# Patient Record
Sex: Female | Born: 1980 | Race: White | Hispanic: No | Marital: Single
Health system: Midwestern US, Academic
[De-identification: ages and names within clinical notes are randomized; demographics above are authoritative.]

## PROBLEM LIST (undated history)

## (undated) DIAGNOSIS — Z3483 Encounter for supervision of other normal pregnancy, third trimester: ICD-10-CM

## (undated) DIAGNOSIS — G43009 Migraine without aura, not intractable, without status migrainosus: Secondary | ICD-10-CM

## (undated) DIAGNOSIS — I159 Secondary hypertension, unspecified: Secondary | ICD-10-CM

## (undated) DIAGNOSIS — R1013 Epigastric pain: Secondary | ICD-10-CM

---

## 2016-04-30 MED ORDER — LABETALOL 300 MG PO TAB
600 mg | ORAL_TABLET | Freq: Three times a day (TID) | ORAL | 2 refills | Status: CN
Start: 2016-04-30 — End: ?

## 2016-05-12 MED ORDER — INSULIN ASPART 100 UNIT/ML SC FLEXPEN
6 refills | 30.00000 days | Status: DC
Start: 2016-05-12 — End: 2016-10-02

## 2016-05-12 MED ORDER — INSULIN ASPART 100 UNIT/ML SC FLEXPEN
6 refills | 42.00000 days | Status: DC
Start: 2016-05-12 — End: 2016-05-12
  Filled 2016-06-26 (×2): qty 45, 31d supply, fill #3

## 2016-05-12 MED ORDER — INSULIN NPH ISOPH U-100 HUMAN 100 UNIT/ML SC SUSP
11 refills | Status: DC
Start: 2016-05-12 — End: 2016-10-02

## 2016-05-23 MED ORDER — CYCLOBENZAPRINE 10 MG PO TAB
10 mg | ORAL_TABLET | Freq: Three times a day (TID) | ORAL | 2 refills | 30.00000 days | Status: DC | PRN
Start: 2016-05-23 — End: 2017-02-23

## 2016-06-21 ENCOUNTER — Encounter: Admit: 2016-06-21 | Discharge: 2016-06-21 | Primary: Adult Health

## 2016-06-23 ENCOUNTER — Encounter: Admit: 2016-06-23 | Discharge: 2016-06-23 | Primary: Adult Health

## 2016-06-23 ENCOUNTER — Ambulatory Visit: Admit: 2016-06-23 | Discharge: 2016-06-24 | Payer: MEDICAID | Primary: Adult Health

## 2016-06-23 DIAGNOSIS — R69 Illness, unspecified: Principal | ICD-10-CM

## 2016-06-23 DIAGNOSIS — O0992 Supervision of high risk pregnancy, unspecified, second trimester: Principal | ICD-10-CM

## 2016-06-23 DIAGNOSIS — O24112 Pre-existing diabetes mellitus, type 2, in pregnancy, second trimester: ICD-10-CM

## 2016-06-26 ENCOUNTER — Encounter: Admit: 2016-06-26 | Discharge: 2016-06-26 | Primary: Adult Health

## 2016-06-26 ENCOUNTER — Ambulatory Visit: Admit: 2016-06-26 | Discharge: 2016-06-27 | Payer: MEDICAID | Primary: Adult Health

## 2016-06-26 DIAGNOSIS — O10919 Unspecified pre-existing hypertension complicating pregnancy, unspecified trimester: ICD-10-CM

## 2016-06-26 DIAGNOSIS — O24912 Unspecified diabetes mellitus in pregnancy, second trimester: Principal | ICD-10-CM

## 2016-06-26 MED FILL — INSULIN SYRINGE-NEEDLE U-100 0.5 ML 31 GAUGE X 5/16 MISC SYRG: 0.5 mL 31 gauge x 5/16" | 31 days supply | Qty: 100 | Fill #3 | Status: CP

## 2016-06-26 MED FILL — INSULIN NPH ISOPH U-100 HUMAN 100 UNIT/ML SC SUSP: 100 unit/mL | 24 days supply | Qty: 200 | Fill #3 | Status: CP

## 2016-06-27 NOTE — Progress Notes
Will discuss pt results and recommendations on 6/13 appt

## 2016-07-02 ENCOUNTER — Ambulatory Visit: Admit: 2016-07-02 | Discharge: 2016-07-02 | Payer: MEDICAID | Primary: Adult Health

## 2016-07-02 ENCOUNTER — Encounter: Admit: 2016-07-02 | Discharge: 2016-07-02 | Payer: MEDICAID | Primary: Adult Health

## 2016-07-02 ENCOUNTER — Encounter: Admit: 2016-07-02 | Discharge: 2016-07-02 | Primary: Adult Health

## 2016-07-02 DIAGNOSIS — O24912 Unspecified diabetes mellitus in pregnancy, second trimester: ICD-10-CM

## 2016-07-02 DIAGNOSIS — O10919 Unspecified pre-existing hypertension complicating pregnancy, unspecified trimester: Principal | ICD-10-CM

## 2016-07-02 DIAGNOSIS — O24913 Unspecified diabetes mellitus in pregnancy, third trimester: ICD-10-CM

## 2016-07-02 DIAGNOSIS — E119 Type 2 diabetes mellitus without complications: Principal | ICD-10-CM

## 2016-07-02 DIAGNOSIS — O0992 Supervision of high risk pregnancy, unspecified, second trimester: Principal | ICD-10-CM

## 2016-07-02 DIAGNOSIS — I10 Essential (primary) hypertension: ICD-10-CM

## 2016-07-02 DIAGNOSIS — E669 Obesity, unspecified: ICD-10-CM

## 2016-07-02 NOTE — Progress Notes
1. Have you or your sexual partner traveled away from [place] in the last 12 weeks? No

## 2016-07-03 ENCOUNTER — Encounter: Admit: 2016-07-03 | Discharge: 2016-07-03 | Primary: Adult Health

## 2016-07-03 LAB — HEMOGLOBIN A1C: Lab: 5.2 % (ref 4.0–6.0)

## 2016-07-03 MED ORDER — METFORMIN 1,000 MG PO TAB
1000 mg | ORAL_TABLET | Freq: Two times a day (BID) | ORAL | 1 refills | Status: SS
Start: 2016-07-03 — End: 2016-10-02

## 2016-07-03 MED FILL — METFORMIN 1,000 MG PO TAB: 1000 mg | ORAL | 90 days supply | Qty: 180 | Status: CN

## 2016-07-03 NOTE — Telephone Encounter
Patient called to determine if her colposcopy had been scheduled.  She was informed of the time and date.  Pt also requested a refill of Metformin 1000 mg BID.  This was called to her pharmacy.

## 2016-07-03 NOTE — Progress Notes
Nettie ElmSamantha Ann Swint presents for an ultrasound encounter. Past Medical, Surgical, Family & Social History; Medications & Allergies contained in the electronic record below were not reviewed today and may not be up-to-date. Please see A/S OBGYN report for all documentation related to this encounter.    07/03/2016  Butch PennyAdriana Jimenez, MA

## 2016-07-13 ENCOUNTER — Encounter: Admit: 2016-07-13 | Discharge: 2016-07-13 | Primary: Adult Health

## 2016-07-14 ENCOUNTER — Ambulatory Visit: Admit: 2016-07-14 | Discharge: 2016-07-14 | Payer: MEDICAID | Primary: Adult Health

## 2016-07-14 ENCOUNTER — Encounter: Admit: 2016-07-14 | Discharge: 2016-07-14 | Primary: Adult Health

## 2016-07-14 DIAGNOSIS — I10 Essential (primary) hypertension: ICD-10-CM

## 2016-07-14 DIAGNOSIS — M898X6 Other specified disorders of bone, lower leg: Principal | ICD-10-CM

## 2016-07-14 DIAGNOSIS — E669 Obesity, unspecified: ICD-10-CM

## 2016-07-14 DIAGNOSIS — E119 Type 2 diabetes mellitus without complications: Principal | ICD-10-CM

## 2016-07-14 MED FILL — METFORMIN 1,000 MG PO TAB: 1000 mg | ORAL | 90 days supply | Qty: 180 | Fill #1 | Status: CP

## 2016-07-15 ENCOUNTER — Encounter: Admit: 2016-07-15 | Discharge: 2016-07-15 | Primary: Adult Health

## 2016-07-15 MED ORDER — PEN NEEDLE, DIABETIC 32 GAUGE X 5/32" MISC NDLE
Freq: Three times a day (TID) | 3 refills | Status: SS | PRN
Start: 2016-07-15 — End: 2016-10-02

## 2016-07-15 NOTE — Progress Notes
Pt called and states she will run out of her insulin inject pen needles 1 week early.  [facility].

## 2016-07-16 ENCOUNTER — Ambulatory Visit: Admit: 2016-07-16 | Discharge: 2016-07-17 | Payer: MEDICAID | Primary: Adult Health

## 2016-07-16 ENCOUNTER — Ambulatory Visit: Admit: 2016-07-16 | Discharge: 2016-07-16 | Payer: MEDICAID | Primary: Adult Health

## 2016-07-16 ENCOUNTER — Encounter: Admit: 2016-07-16 | Discharge: 2016-07-16 | Primary: Adult Health

## 2016-07-16 DIAGNOSIS — E119 Type 2 diabetes mellitus without complications: Principal | ICD-10-CM

## 2016-07-16 DIAGNOSIS — M898X6 Other specified disorders of bone, lower leg: Principal | ICD-10-CM

## 2016-07-16 DIAGNOSIS — O0993 Supervision of high risk pregnancy, unspecified, third trimester: Principal | ICD-10-CM

## 2016-07-16 DIAGNOSIS — O24913 Unspecified diabetes mellitus in pregnancy, third trimester: ICD-10-CM

## 2016-07-16 DIAGNOSIS — E669 Obesity, unspecified: ICD-10-CM

## 2016-07-16 DIAGNOSIS — O10919 Unspecified pre-existing hypertension complicating pregnancy, unspecified trimester: Principal | ICD-10-CM

## 2016-07-16 DIAGNOSIS — I10 Essential (primary) hypertension: ICD-10-CM

## 2016-07-16 MED FILL — PEN NEEDLE, DIABETIC 32 GAUGE X 5/32" MISC NDLE: 32 gauge x 5/" | 25 days supply | Qty: 100 | Fill #1 | Status: CP

## 2016-07-16 NOTE — Progress Notes
1. Have you or your sexual partner traveled away from [place] in the last 12 weeks? No

## 2016-07-17 NOTE — Progress Notes
Tonya Ortiz presents for an ultrasound encounter. Past Medical, Surgical, Family & Social History; Medications & Allergies contained in the electronic record below were not reviewed today and may not be up-to-date. Please see A/S OBGYN report for all documentation related to this encounter.    07/17/2016  Tonya PennyAdriana Jimenez, MA

## 2016-07-18 ENCOUNTER — Encounter: Admit: 2016-07-18 | Discharge: 2016-07-18 | Primary: Adult Health

## 2016-07-18 NOTE — Progress Notes
Email with sono and echo records sent to ped surg. and NICU requesting consults.  In NICU email I included her next appt July 9th at 815 and requested them to come to this appt.

## 2016-07-22 ENCOUNTER — Encounter: Admit: 2016-07-22 | Discharge: 2016-07-22 | Primary: Adult Health

## 2016-07-22 DIAGNOSIS — Z3A27 27 weeks gestation of pregnancy: Principal | ICD-10-CM

## 2016-07-22 NOTE — Telephone Encounter
Dr. Essie ChristineVopat reviewed patients recent MRI, states patient has a very small stress reaction and to follow up in clinic in 4 weeks.  Relayed this information to patient.  Notified patient that our Irena CordsDon Joy Rep will be calling to set up a time to fit the patient for a boot.  FUV scheduled.  Questions/concerns addressed.

## 2016-07-28 ENCOUNTER — Encounter: Admit: 2016-07-28 | Discharge: 2016-07-28 | Primary: Adult Health

## 2016-07-28 ENCOUNTER — Ambulatory Visit: Admit: 2016-07-28 | Discharge: 2016-07-29 | Payer: MEDICAID | Primary: Adult Health

## 2016-07-28 ENCOUNTER — Ambulatory Visit: Admit: 2016-07-28 | Discharge: 2016-07-28 | Payer: MEDICAID | Primary: Adult Health

## 2016-07-28 DIAGNOSIS — O359XX0 Maternal care for (suspected) fetal abnormality and damage, unspecified, not applicable or unspecified: Principal | ICD-10-CM

## 2016-07-28 DIAGNOSIS — O2623 Pregnancy care for patient with recurrent pregnancy loss, third trimester: ICD-10-CM

## 2016-07-28 DIAGNOSIS — O24913 Unspecified diabetes mellitus in pregnancy, third trimester: ICD-10-CM

## 2016-07-28 DIAGNOSIS — O99213 Obesity complicating pregnancy, third trimester: ICD-10-CM

## 2016-07-28 DIAGNOSIS — E119 Type 2 diabetes mellitus without complications: Principal | ICD-10-CM

## 2016-07-28 DIAGNOSIS — O0992 Supervision of high risk pregnancy, unspecified, second trimester: Principal | ICD-10-CM

## 2016-07-28 DIAGNOSIS — E669 Obesity, unspecified: ICD-10-CM

## 2016-07-28 DIAGNOSIS — Z539 Procedure and treatment not carried out, unspecified reason: Principal | ICD-10-CM

## 2016-07-28 DIAGNOSIS — IMO0002 Fetal anomaly: ICD-10-CM

## 2016-07-28 DIAGNOSIS — I10 Essential (primary) hypertension: ICD-10-CM

## 2016-07-28 DIAGNOSIS — O10913 Unspecified pre-existing hypertension complicating pregnancy, third trimester: Principal | ICD-10-CM

## 2016-07-28 LAB — CBC
Lab: 2.8 M/UL — ABNORMAL LOW (ref 4.0–5.0)
Lab: 26 % — ABNORMAL LOW (ref 36–45)
Lab: 32 pg (ref 26–34)
Lab: 5.6 10*3/uL (ref 4.5–11.0)
Lab: 9.4 g/dL — ABNORMAL LOW (ref 12.0–15.0)
Lab: 92 FL (ref 80–100)

## 2016-07-28 LAB — SYPHILIS AB SCREEN: Lab: NEGATIVE

## 2016-07-28 LAB — HEMOGLOBIN A1C: Lab: 4.8 % (ref 4.0–6.0)

## 2016-07-28 MED FILL — INSULIN NPH ISOPH U-100 HUMAN 100 UNIT/ML SC SUSP: 100 unit/mL | 24 days supply | Qty: 200 | Fill #4 | Status: CP

## 2016-07-28 NOTE — Progress Notes
1. Have you or your sexual partner traveled away from [place] in the last 12 weeks? No

## 2016-07-28 NOTE — Progress Notes
Colposcopy Clinic Update Note    35 G6P0050 at 421w0d with negative cytology, HR HPV+. Patient was consented for colposcopy and placed in dorsal lithotomy. 3 different speculums were placed to attempt to visualize the cervix. We were unable to adequately visualize the cervix due to patient's BMI and body habitus and pregnancy. We would recommend repeat colposcopy 8 weeks post partum. We would also recommend topical nystatin for probable fungal disease in her pannus and groin folds.    Harriet PhoKaren Estrada-Stephen M.D.  Obstetrics and Gynecology, PGY-2  [facility]

## 2016-07-29 NOTE — Progress Notes
High Risk-Return OB Visit    S: Ms. Meinhart is a 36 y.o. G6P0050 at [redacted]w[redacted]d  by L=10 presenting for routine prenatal care. Estimated Date of Delivery: 10/20/16     Patient is doing very well. Has not acute complaints   Denies nausea, vomiting, HA, constipation, diarrhea     +FM, -VB, -CTX, -LOF-    O: See ACOG.   Patient did not bring logs today.     Vitals:    07/28/16 0810   BP: 121/76   Pulse: 88         CAFC Sono: No sono today     A: 36 y.o. F G6P0050 @ [redacted]w[redacted]d by L=10. Estimated Date of Delivery: 10/20/16  cHTN  DMII  Fetus with CPAM  Morbid Obesity, BMI 61  Rh POS, R Immune    P:   DM II:???  ??? HgbA1c 6.5 (03/27/16) --> recheck A1c today   ??? TSH 0.05, fT4 0.97???(2/28)   ??? Baseline 24hr urine protein 173mg  (03/27/16)  ??? Maternal echo/EKG (03/27/16): NSR  ? Patient had a screening EKG however in the setting of what sounds like paroxysmal nocturnal dyspnea will get screening echo  ? Echo 6/4: wnl, EF 60%  ? May consider sleep study  ? Ophtho Appointment in [place] 05/14/16  ??? Medication regimen: no change today   ? Metformin 1000mg  BID   ? Insulin Regimen: NPH 50/30, Novolog 26/20/28???  ??? Fetal echo - repeat at next ultrasound   ??? Growth [place] q4wks beginning @24  wga   ??? 6/7: Breech, posterior placenta, EFW 713g (89%), AC 97%  ??? 6/27: cephalic, posterior placenta, EFW 899g (37%), AC 46%  ??? Weekly BPPs beginning @32  wga  ??? Delivery @39  wga  ???  cHTN:  ??? Baseline 24hr urine protein 173 (03/27/16)  ??? Meds: Labetalol 600mg  BID  ??? Recommend ASA 162mg  QD beginning between 10-16wks (d/c at 36 wks)  ??? Today's BP: 121/76  ??? Montior BPs closely and watch for signs/symptoms of preeclampsia  ??? Reviewed increased risk of worsening hypertension, preeclampsia, growth restriction, preterm birth, and stillbirth.  ??? Growth [place] q4wks beginning @24  wga  ??? If the patient requires antihypertensives during pregnancy, we recommend weekly BPPs beginning @32  wga  ??? Delivery @37 -39 wga depending on BP control  ??? Fetus with Congenital Pulmonary Airway Malformation:  ??? Discovered on detailed [place] 5/14  ??? Follow limited US q 2 weeks  ??? Measures 3.6x3.3x3.7cm and fills left thorax with right displacement of fetal cardiac structures  ? 5/30 3.98 x 3.94 x 3.70  ? 6/13: 3x91 x 4.15 x 2.51 cm  ? 6/27: 3.77x3.40x2.39 cm   ??? Growth [place] (per DM and CHTN recs)  ??? BPPs (per DM and CHTN recs)  ??? Will need a NICU and Peds surgery consult in 3rd trimester  ? NICU consult completed: 7/9  ? Peds Surgery Schedule: 7/11    ???  Morbid Obesity:  ??? Discussed appropriate weight gain in pregnancy (based on pre-pregnancy BMI)  ? Class III Obesity (BMI >40): -15 to 0 lb  ??? Recommend growth ultrasound q4wks beginning at 24 wga  ???  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 Normal Pap but HR HPV positive  ??? Colposcopy scheduled 7/9 - unable to perform   ??? Colposcopy was unable to be performed, needs a colposcopy 8 weeks postpartum   ???  Pregnancy, HROB will be primary:???  ??? PNV  ??? PNLs, urine culture - wnl  ??? Cervical cancer screening - pap NSIL, +  HR HPV, will need PP colpo  ??? NT @12 -13wga - not possible due to maternal body habitus, NIPs low risk   ? Carrier screening neg  ??? MSAFP - neg  ??? Detailed [place] @18 -20wga - CPAM: see above   ??? CBC, Ab screen, syph ab @28  wks  ??? Recommend Tdap in 3rd trimester: next appointment   ??? Collect GBS @35wga    ??? PP BCM: Will discuss 3rd TM    Future Appointments  Date Time Provider Department Center   07/30/2016 9:45 AM Schropp, Kenyon Ana, MD PEDSURG [facility]   07/30/2016 11:30 AM OBGYN SONO RM 2 [facility]   08/11/2016 10:15 AM OBGYN SONO RM 4 [facility]   08/11/2016 11:15 AM OBGYN [facility] RISK [facility]IN[facility]   08/25/2016 9:40 AM Vopat, Lowry Ram, MD ORTHOCL [facility] Orthoped       At next visit f/u sugars, [place], 28 week labs. A1c,   Colposcopy was not able to be performed, needs a colposcopy 8 weeks postpartum     Patient was seen in colposcopy clinic, and it was noted that patient had panus fungal infection. Check next week and consider treating with nystatin.     Follow up 2 weeks     I spent 15 minutes counseling and coordinating care for [facility] of which >50% of that time ( was spent in direct face to face consultation with the patient.

## 2016-07-30 ENCOUNTER — Ambulatory Visit: Admit: 2016-07-30 | Discharge: 2016-07-31 | Payer: MEDICAID | Primary: Adult Health

## 2016-07-30 DIAGNOSIS — O358XX0 Maternal care for other (suspected) fetal abnormality and damage, not applicable or unspecified: Principal | ICD-10-CM

## 2016-07-30 NOTE — Progress Notes
I had the pleasure of seeing Tonya PolingSamantha Deliz in my office today for an office consult.  She has a fetus with a presumed CPAM.  I had a very long discussion with her (30 minutes) about the diagnosis.  We discussed this a little about how things could change throughout the pregnancy.  I explained to her the different treatment/surgeries that may be done.  These range from not doing anything at all if the child is totally asymptomatic and this is very small to possibly removing this because of the very small risk of cancer when she is older to needing to remove this urgently if she is symptomatic when she is born.  She understood and asked reasonable questions.  I gave her my nurse's card and asked her to let us know if she will be induced before her due date of October 4.

## 2016-08-01 NOTE — Progress Notes
Tonya Ortiz presents for an ultrasound encounter. Past Medical, Surgical, Family & Social History; Medications & Allergies contained in the electronic record below were not reviewed today and may not be up-to-date. Please see A/S OBGYN report for all documentation related to this encounter.    08/01/2016  Butch PennyAdriana Jimenez, MA

## 2016-08-06 ENCOUNTER — Encounter: Admit: 2016-08-06 | Discharge: 2016-08-06 | Primary: Adult Health

## 2016-08-06 NOTE — Progress Notes
Nettie ElmSamantha Ann Feliciano presents for an ultrasound encounter. Past Medical, Surgical, Family & Social History; Medications & Allergies contained in the electronic record below were not reviewed today and may not be up-to-date. Please see A/S OBGYN report for all documentation related to this encounter.    08/06/2016  Kathyrn LassMaria Helyne Genther, MA

## 2016-08-07 ENCOUNTER — Encounter: Admit: 2016-08-07 | Discharge: 2016-08-07 | Primary: Adult Health

## 2016-08-07 DIAGNOSIS — O24913 Unspecified diabetes mellitus in pregnancy, third trimester: Principal | ICD-10-CM

## 2016-08-07 DIAGNOSIS — O10919 Unspecified pre-existing hypertension complicating pregnancy, unspecified trimester: ICD-10-CM

## 2016-08-11 ENCOUNTER — Encounter: Admit: 2016-08-11 | Discharge: 2016-08-11 | Primary: Adult Health

## 2016-08-11 ENCOUNTER — Ambulatory Visit: Admit: 2016-08-11 | Discharge: 2016-08-12 | Payer: MEDICAID | Primary: Adult Health

## 2016-08-11 DIAGNOSIS — O10913 Unspecified pre-existing hypertension complicating pregnancy, third trimester: ICD-10-CM

## 2016-08-11 DIAGNOSIS — O2623 Pregnancy care for patient with recurrent pregnancy loss, third trimester: ICD-10-CM

## 2016-08-11 DIAGNOSIS — O0992 Supervision of high risk pregnancy, unspecified, second trimester: ICD-10-CM

## 2016-08-11 DIAGNOSIS — E119 Type 2 diabetes mellitus without complications: Principal | ICD-10-CM

## 2016-08-11 DIAGNOSIS — E669 Obesity, unspecified: ICD-10-CM

## 2016-08-11 DIAGNOSIS — O24913 Unspecified diabetes mellitus in pregnancy, third trimester: Principal | ICD-10-CM

## 2016-08-11 DIAGNOSIS — O0993 Supervision of high risk pregnancy, unspecified, third trimester: Principal | ICD-10-CM

## 2016-08-11 DIAGNOSIS — O10919 Unspecified pre-existing hypertension complicating pregnancy, unspecified trimester: ICD-10-CM

## 2016-08-11 DIAGNOSIS — I10 Essential (primary) hypertension: ICD-10-CM

## 2016-08-11 DIAGNOSIS — Z23 Encounter for immunization: ICD-10-CM

## 2016-08-11 MED FILL — INSULIN SYRINGE-NEEDLE U-100 0.5 ML 31 GAUGE X 5/16 MISC SYRG: 0.5 mL 31 gauge x 5/16" | 31 days supply | Qty: 100 | Fill #4 | Status: CP

## 2016-08-11 MED FILL — INSULIN ASPART 100 UNIT/ML SC FLEXPEN: 100 unit/mL (3 mL) | 31 days supply | Qty: 45 | Fill #4 | Status: CP

## 2016-08-11 NOTE — Progress Notes
1. Have you or your sexual partner traveled away from [place] in the last 12 weeks? No

## 2016-08-12 ENCOUNTER — Encounter: Admit: 2016-08-12 | Discharge: 2016-08-12 | Primary: Adult Health

## 2016-08-12 NOTE — Progress Notes
High Risk-Return OB Visit    S: Tonya Ortiz is a 36 y.o. G6P0050 at [redacted]w[redacted]d  by L=10 presenting for routine prenatal care. Estimated Date of Delivery: 10/20/16     Tonya Ortiz has no acute complaints today. She denies headache, chest pain, SOB, and changes in vision. She overall feels well. She does report that she was recently diagnosed with a left tibia stress fracture after increase LLE pain and a visit with [facility]. She is wearing the recommended orthopedic boot at this time on LLE and avoiding prolonged walking or standing. She has a follow-up with them on 08/25/16.     +FM, -VB, -CTX, -LOF    O: See ACOG.   See glucose logs: >50% of fasting and post-prandial glucoses within appropriate range.    Vitals:    08/11/16 1006   BP: 121/74   Pulse: 98       CAFC Sono: Growth [place] today    Gen: NAD  Cardiac: regular rate  Lungs: unlabored  Abd: soft, gravid, nttp  Ext: LLE with orthopedic boot, no RLE edema, nttp    A: 36 y.o. F G6P0050 @ [redacted]w[redacted]d by L=10. Estimated Date of Delivery: 10/20/16  cHTN  DMII  Fetus with CPAM  Morbid Obesity, BMI 65.1  Rh POS, R Immune    P:   DM II:???  ??? HgbA1c 6.5 (03/27/16) --> 5.2 (6/13) --> 4.8 (7/9)  ??? TSH 0.05, fT4 0.97???(2/28)   ??? Baseline 24hr urine protein 173mg  (03/27/16)  ??? Maternal echo/EKG (03/27/16): NSR  ? Patient had a screening EKG however in the setting of what sounds like paroxysmal nocturnal dyspnea will get screening echo  ? Echo 6/4: wnl, EF 60%  ? May consider sleep study  ? Ophtho Appointment in [place] 05/14/16  ??? Medication regimen:    ? Metformin 1000mg  BID   ? Insulin Regimen: NPH 50/30, Novolog 26/20/28???- no changes  ??? Fetal echo - repeat at next ultrasound   ??? Growth [place] q4wks beginning @24  wga   ??? 6/7: Breech, posterior placenta, EFW 713g (89%), AC 97%  ??? 6/27: cephalic, posterior placenta, EFW 899g (37%), AC 46%  ??? Weekly BPPs beginning @32  wga  ??? Delivery @39  wga  ???  cHTN:  ??? Baseline 24hr urine protein 173 (03/27/16)  ??? Meds: Labetalol 600mg  BID --> increased to TID ??? Recommend ASA 162mg  QD beginning between 10-16wks (d/c at 36 wks)  ??? Today's BP: 121/74  ??? Montior BPs closely and watch for signs/symptoms of preeclampsia  ??? Reviewed increased risk of worsening hypertension, preeclampsia, growth restriction, preterm birth, and stillbirth.  ??? Growth [place] q4wks beginning @24  wga  ??? If the patient requires antihypertensives during pregnancy, we recommend weekly BPPs beginning @32  wga  ??? Delivery @37 -39 wga depending on BP control  ???  Fetus with Congenital Pulmonary Airway Malformation:  ??? Discovered on detailed [place] 5/14  ??? Follow limited US q 2 weeks  ??? Measures 3.6x3.3x3.7cm and fills left thorax with right displacement of fetal cardiac structures  ? 5/30 3.98 x 3.94 x 3.70  ? 6/13: 3.91 x 4.15 x 2.51 cm  ? 6/27: 3.77x3.40x2.39 cm   ? 7/9: 4.46x2.64x3.48cm, solid CCAM, CVR 0.8 (<1.2 low risk for hydrops)  ??? Growth [place] (per DM and CHTN recs)  ??? BPPs (per DM and CHTN recs)  ??? Will need a NICU and Peds surgery consult in 3rd trimester  ? NICU consult completed: 7/9  ? Peds Surgery Schedule: 7/11  ? Seen by Dr. Vicenta Dunning -  cystic adenomatoid malformation of fetus --> will continue to follow - decision of surg will be based on fetal symptoms at delivery    ???  Morbid Obesity:  ??? Discussed appropriate weight gain in pregnancy (based on pre-pregnancy BMI)  ? Class III Obesity (BMI >40): -15 to 0 lb  ??? Recommend growth ultrasound q4wks beginning at 24 wga  ???  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 Normal Pap but HR HPV positive  ??? Colposcopy scheduled 7/9 - unable to perform   ??? Colposcopy was unable to be performed, needs a colposcopy 8 weeks postpartum   ??? Dr. Junie Bame appt scheduled 09/11/16  ???  Pregnancy, HROB will be primary:???  ??? PNV  ??? PNLs, urine culture - wnl  ??? Cervical cancer screening - pap NSIL, +HR HPV, will need PP colpo  ??? NT @12 -13wga - not possible due to maternal body habitus, NIPs low risk   ? Carrier screening neg  ??? MSAFP - neg  ??? Detailed [place] @18 -20wga - CPAM: see above ??? CBC, Ab screen, syph ab @28  wks - Hgb 9.4 (7/9)  ??? S/p Tdap 7/23  ??? Collect GBS @35wga    ??? PP BCM: Will discuss 3rd TM  ??? Delivery - IOL @ 39wga      At next visit f/u on blood sugars, growth Korea, BPP    RTC in 2 week(s)    ATTESTATION    I personally performed the E/M including history, physical exam, and MDM. I spent 45 minutes face to face with the patient, > 50% of the time in counseling and coordination of care.      Staff name:  Patsy Lager, MD Date:  08/12/2016            Future Appointments  Date Time Provider Department Center   08/25/2016 9:40 AM Vopat, Lowry Ram, MD ORTHOCL [facility] Orthoped   08/25/2016 10:30 AM OBGYN SONO RM 2 [facility]   08/25/2016 11:00 AM OBGYN [facility]K [facility]IN[facility]   09/11/2016 1:45 PM Delray Alt, MD [facility]

## 2016-08-13 NOTE — Progress Notes
Tonya Ortiz presents for an ultrasound encounter. Past Medical, Surgical, Family & Social History; Medications & Allergies contained in the electronic record below were not reviewed today and may not be up-to-date. Please see A/S OBGYN report for all documentation related to this encounter.    08/13/2016  Butch PennyAdriana Jimenez, MA

## 2016-08-16 ENCOUNTER — Encounter: Admit: 2016-08-16 | Discharge: 2016-08-16 | Primary: Adult Health

## 2016-08-20 ENCOUNTER — Encounter: Admit: 2016-08-20 | Discharge: 2016-08-20 | Primary: Adult Health

## 2016-08-20 DIAGNOSIS — O10919 Unspecified pre-existing hypertension complicating pregnancy, unspecified trimester: Principal | ICD-10-CM

## 2016-08-20 MED ORDER — LABETALOL 300 MG PO TAB
ORAL_TABLET | Freq: Three times a day (TID) | 3 refills | Status: SS
Start: 2016-08-20 — End: 2016-10-02

## 2016-08-22 ENCOUNTER — Encounter: Admit: 2016-08-22 | Discharge: 2016-08-22 | Primary: Adult Health

## 2016-08-22 DIAGNOSIS — O24113 Pre-existing diabetes mellitus, type 2, in pregnancy, third trimester: Principal | ICD-10-CM

## 2016-08-22 DIAGNOSIS — O163 Unspecified maternal hypertension, third trimester: ICD-10-CM

## 2016-08-25 ENCOUNTER — Encounter: Admit: 2016-08-25 | Discharge: 2016-08-25 | Primary: Adult Health

## 2016-08-25 ENCOUNTER — Ambulatory Visit: Admit: 2016-08-25 | Discharge: 2016-08-25 | Payer: MEDICAID | Primary: Adult Health

## 2016-08-25 DIAGNOSIS — E669 Obesity, unspecified: ICD-10-CM

## 2016-08-25 DIAGNOSIS — M898X6 Other specified disorders of bone, lower leg: Principal | ICD-10-CM

## 2016-08-25 DIAGNOSIS — O0993 Supervision of high risk pregnancy, unspecified, third trimester: Principal | ICD-10-CM

## 2016-08-25 DIAGNOSIS — O24113 Pre-existing diabetes mellitus, type 2, in pregnancy, third trimester: Principal | ICD-10-CM

## 2016-08-25 DIAGNOSIS — E119 Type 2 diabetes mellitus without complications: Principal | ICD-10-CM

## 2016-08-25 DIAGNOSIS — O163 Unspecified maternal hypertension, third trimester: ICD-10-CM

## 2016-08-25 DIAGNOSIS — I10 Essential (primary) hypertension: ICD-10-CM

## 2016-08-25 MED FILL — INSULIN NPH ISOPH U-100 HUMAN 100 UNIT/ML SC SUSP: 100 unit/mL | 24 days supply | Qty: 200 | Fill #5 | Status: CP

## 2016-08-25 MED FILL — PEN NEEDLE, DIABETIC 32 GAUGE X 5/32" MISC NDLE: 32 gauge x 5/" | 25 days supply | Qty: 100 | Fill #2 | Status: CP

## 2016-08-25 NOTE — Progress Notes
1. Have you or your sexual partner traveled away from [place] in the last 12 weeks? No

## 2016-09-01 ENCOUNTER — Encounter: Admit: 2016-09-01 | Discharge: 2016-09-01 | Primary: Adult Health

## 2016-09-01 ENCOUNTER — Ambulatory Visit: Admit: 2016-09-01 | Discharge: 2016-09-02 | Payer: MEDICAID | Primary: Adult Health

## 2016-09-01 DIAGNOSIS — O99713 Diseases of the skin and subcutaneous tissue complicating pregnancy, third trimester: Principal | ICD-10-CM

## 2016-09-01 DIAGNOSIS — O24113 Pre-existing diabetes mellitus, type 2, in pregnancy, third trimester: ICD-10-CM

## 2016-09-01 DIAGNOSIS — O163 Unspecified maternal hypertension, third trimester: ICD-10-CM

## 2016-09-01 DIAGNOSIS — O10919 Unspecified pre-existing hypertension complicating pregnancy, unspecified trimester: ICD-10-CM

## 2016-09-01 DIAGNOSIS — O24913 Unspecified diabetes mellitus in pregnancy, third trimester: Principal | ICD-10-CM

## 2016-09-01 DIAGNOSIS — I10 Essential (primary) hypertension: ICD-10-CM

## 2016-09-01 DIAGNOSIS — E669 Obesity, unspecified: ICD-10-CM

## 2016-09-01 DIAGNOSIS — E119 Type 2 diabetes mellitus without complications: Principal | ICD-10-CM

## 2016-09-01 LAB — URINALYSIS DIPSTICK
Lab: NEGATIVE % (ref 0–2)
Lab: NEGATIVE % (ref 4–12)
Lab: NEGATIVE % — ABNORMAL LOW (ref 24–44)
Lab: NEGATIVE 10*3/uL (ref 1.0–4.8)
Lab: NEGATIVE 10*3/uL (ref 1.8–7.0)
Lab: NEGATIVE FL (ref 7–11)

## 2016-09-01 LAB — COMPREHENSIVE METABOLIC PANEL
Lab: 0.3 mg/dL (ref 0.3–1.2)
Lab: 0.8 mg/dL (ref 0.4–1.00)
Lab: 10 U/L (ref 7–40)
Lab: 105 MMOL/L (ref 98–110)
Lab: 11 U/L (ref 7–56)
Lab: 135 MMOL/L — ABNORMAL LOW (ref 137–147)
Lab: 24 MMOL/L (ref 21–30)
Lab: 3.5 g/dL (ref 3.5–5.0)
Lab: 4.1 MMOL/L (ref 3.5–5.1)
Lab: 6 (ref 3–12)
Lab: 64 U/L (ref 25–110)
Lab: 7 g/dL (ref 6.0–8.0)
Lab: 87 mg/dL (ref 70–100)
Lab: 9.6 mg/dL (ref 8.5–10.6)
Lab: >60 mL/min (ref >60)
Lab: >60 mL/min (ref >60)

## 2016-09-01 LAB — DIRECT EXAM (WET PREP)

## 2016-09-01 LAB — CREATININE
Lab: 0.8 mg/dL (ref 0.4–1.00)
Lab: >60 mL/min (ref >60)
Lab: >60 mL/min (ref >60)

## 2016-09-01 LAB — CBC AND DIFF
Lab: 0 10*3/uL (ref 0–0.45)
Lab: 0.1 10*3/uL (ref 0–0.20)
Lab: 0.4 10*3/uL (ref 0–0.80)
Lab: 3 M/UL — ABNORMAL LOW (ref 4.0–5.0)
Lab: 6.8 10*3/uL (ref 4.5–11.0)

## 2016-09-01 LAB — PROTEIN/CR RATIO,UR RAN
Lab: 0.1 K/UL — AB (ref 0–5)
Lab: 172 mg/dL (ref 5.0–8.0)
Lab: 25 mg/dL (ref 0–3)

## 2016-09-01 LAB — URINE COLLECTION
Lab: 24
Lab: 550 mL

## 2016-09-01 LAB — TOTAL PROTEIN-URINE 24 HR
Lab: 165 mg/(24.h) — ABNORMAL HIGH (ref 50–150)
Lab: 30 mg/dL

## 2016-09-01 LAB — LDH-LACTATE DEHYDROGENASE: Lab: 170 U/L — ABNORMAL LOW (ref 100–210)

## 2016-09-01 LAB — URINALYSIS, MICROSCOPIC

## 2016-09-01 LAB — URIC ACID: Lab: 5.6 mg/dL — ABNORMAL LOW (ref 2.0–7.0)

## 2016-09-01 MED ORDER — LABETALOL 300 MG PO TAB
600 mg | ORAL | 0 refills | Status: DC
Start: 2016-09-01 — End: 2016-09-02
  Administered 2016-09-01: 22:00:00 600 mg via ORAL

## 2016-09-01 MED ORDER — ACETAMINOPHEN 500 MG PO TAB
1000 mg | ORAL | 0 refills | Status: DC | PRN
Start: 2016-09-01 — End: 2016-09-02
  Administered 2016-09-01: 22:00:00 1000 mg via ORAL

## 2016-09-01 MED ORDER — PROCHLORPERAZINE MALEATE 10 MG PO TAB
10 mg | ORAL | 0 refills | Status: DC | PRN
Start: 2016-09-01 — End: 2016-09-02
  Administered 2016-09-01: 22:00:00 10 mg via ORAL

## 2016-09-01 MED ORDER — ASPIRIN 325 MG PO TAB
325 mg | Freq: Every day | ORAL | 0 refills | Status: DC
Start: 2016-09-01 — End: 2016-09-01
  Administered 2016-09-01: 22:00:00 325 mg via ORAL

## 2016-09-01 NOTE — Progress Notes
1501 - Pt to triage 4 from clinic for r/o pre-E. Pt taken to [id number] to shower, then placed on monitors. Pt has red areas within all skin folds.  [id number] - Ok for pt to come off monitors while waiting for venous dopplers.  1925 - Report given to S.Daphane ShepherdMeyer, RN.

## 2016-09-01 NOTE — Progress Notes
TC from patient requesting letter to be released from work d/t increase leg swelling and difficulty walking.Required to work 8-10 hours daily requires walking/standing.     Patient verbalized BP today 125/95 and HA.  Patient states she was bringing 24 hour urine with her to sono appt today at 2:00.  Reviewed with Dr. Ann MakiParrish, patient to report to triage to r/o Pre-E.  Patient notified, states she does not have a ride until her 2:00 BPP, [facility] to [facility]. Encourage patient to come asap.   Instructed to go to nearest hospital with worsening symptoms. L&D notified patient to come to triage today.

## 2016-09-02 ENCOUNTER — Encounter: Admit: 2016-09-01 | Discharge: 2016-09-02 | Payer: MEDICAID | Primary: Adult Health

## 2016-09-02 ENCOUNTER — Encounter: Admit: 2016-09-02 | Discharge: 2016-09-02 | Primary: Adult Health

## 2016-09-02 ENCOUNTER — Ambulatory Visit: Admit: 2016-09-01 | Discharge: 2016-09-01 | Primary: Adult Health

## 2016-09-02 DIAGNOSIS — Z7984 Long term (current) use of oral hypoglycemic drugs: ICD-10-CM

## 2016-09-02 DIAGNOSIS — E119 Type 2 diabetes mellitus without complications: ICD-10-CM

## 2016-09-02 DIAGNOSIS — O163 Unspecified maternal hypertension, third trimester: ICD-10-CM

## 2016-09-02 DIAGNOSIS — O10913 Unspecified pre-existing hypertension complicating pregnancy, third trimester: Principal | ICD-10-CM

## 2016-09-02 DIAGNOSIS — O358XX0 Maternal care for other (suspected) fetal abnormality and damage, not applicable or unspecified: ICD-10-CM

## 2016-09-02 DIAGNOSIS — Z048 Encounter for examination and observation for other specified reasons: ICD-10-CM

## 2016-09-02 DIAGNOSIS — O99213 Obesity complicating pregnancy, third trimester: ICD-10-CM

## 2016-09-02 DIAGNOSIS — O24113 Pre-existing diabetes mellitus, type 2, in pregnancy, third trimester: ICD-10-CM

## 2016-09-02 DIAGNOSIS — Z3A33 33 weeks gestation of pregnancy: ICD-10-CM

## 2016-09-02 DIAGNOSIS — Z7982 Long term (current) use of aspirin: ICD-10-CM

## 2016-09-02 DIAGNOSIS — Z6841 Body Mass Index (BMI) 40.0 and over, adult: ICD-10-CM

## 2016-09-02 DIAGNOSIS — O0993 Supervision of high risk pregnancy, unspecified, third trimester: Secondary | ICD-10-CM

## 2016-09-02 LAB — CREATININE CLEARANCE-URINE 24H
Lab: 0.8 mg/dL
Lab: 172 mg/dL

## 2016-09-02 NOTE — Progress Notes
Brief Update Note:    Pre-eclampsia labs negative. LE doppler Negative. Blood pressures mildly elevated, PTA labetalol given.    Home with precautions.    Aggie MoatsMegan J Creedence Kunesh, MD

## 2016-09-02 NOTE — Progress Notes
1925: Report received from A. Maisie Fushomas, RN. Care assumed at this time.     2002: Pt to ultrasound via wheelchair with transport.     2045: Pt back to unit from ultrasound.

## 2016-09-02 NOTE — Progress Notes
Pt called and states she thought we were going to give her a "water pill" for her swelling when discharged from hospital. Discussed with Dr. Ann MakiParrish and he states will evaluate pt at her next clinic appt Monday 09/08/16.  Pt is also wanting a letter to give her work to start her maternity leave.  Dr. Ann MakiParrish would also like to discuss this at her next visit.  Will call pt and provide her with this information.  If pt has any concerns or worsens she should call office or doctor on call and discuss symptoms to determine any need for evaluation.

## 2016-09-04 NOTE — Progress Notes
Bunice Delahanty presents for an ultrasound encounter. Past Medical, Surgical, Family & Social History; Medications & Allergies contained in the electronic record below were not reviewed today and may not be up-to-date. Please see [facility] OBGYN report for all documentation related to this encounter.

## 2016-09-08 ENCOUNTER — Encounter: Admit: 2016-09-08 | Discharge: 2016-09-08 | Primary: Adult Health

## 2016-09-08 ENCOUNTER — Ambulatory Visit: Admit: 2016-09-08 | Discharge: 2016-09-08 | Payer: MEDICAID | Primary: Adult Health

## 2016-09-08 DIAGNOSIS — E119 Type 2 diabetes mellitus without complications: Principal | ICD-10-CM

## 2016-09-08 DIAGNOSIS — O163 Unspecified maternal hypertension, third trimester: Principal | ICD-10-CM

## 2016-09-08 DIAGNOSIS — O0993 Supervision of high risk pregnancy, unspecified, third trimester: Principal | ICD-10-CM

## 2016-09-08 DIAGNOSIS — E669 Obesity, unspecified: ICD-10-CM

## 2016-09-08 DIAGNOSIS — I10 Essential (primary) hypertension: ICD-10-CM

## 2016-09-08 DIAGNOSIS — O24113 Pre-existing diabetes mellitus, type 2, in pregnancy, third trimester: Principal | ICD-10-CM

## 2016-09-08 LAB — POC GLUCOSE
Lab: 53 mg/dL — ABNORMAL LOW (ref 70–100)
Lab: 47 mg/dL — CL (ref 70–100)
Lab: 54 mg/dL — ABNORMAL LOW (ref 70–100)
Lab: 55 mg/dL — ABNORMAL LOW (ref 70–100)
Lab: 43 mg/dL — CL (ref 70–100)
Lab: 71 mg/dL (ref 70–100)

## 2016-09-08 LAB — CBC AND DIFF
Lab: 0 % (ref 0–2)
Lab: 0 % (ref 0–5)
Lab: 0 10*3/uL (ref 0–0.20)
Lab: 0 10*3/uL (ref 0–0.45)
Lab: 0.3 10*3/uL (ref 0–0.80)
Lab: 1.7 10*3/uL (ref 1.0–4.8)
Lab: 13 % (ref 11–15)
Lab: 226 10*3/uL (ref 150–400)
Lab: 26 % — ABNORMAL LOW (ref 36–45)
Lab: 3.5 10*3/uL (ref 1.8–7.0)
Lab: 31 % (ref 24–44)
Lab: 33 pg (ref 26–34)
Lab: 34 g/dL (ref 32.0–36.0)
Lab: 5 % (ref 4–12)
Lab: 5.5 10*3/uL — ABNORMAL HIGH (ref 4.5–11.0)
Lab: 64 % (ref 41–77)
Lab: 8 FL (ref 7–11)
Lab: 9.2 g/dL — ABNORMAL LOW (ref 12.0–15.0)
Lab: 94 FL (ref 80–100)

## 2016-09-08 MED ORDER — INSULIN ASPART 100 UNIT/ML SC FLEXPEN
28 [IU] | Freq: Once | SUBCUTANEOUS | 0 refills | Status: CP
Start: 2016-09-08 — End: ?
  Administered 2016-09-09: 28 [IU] via SUBCUTANEOUS

## 2016-09-08 MED ORDER — LABETALOL 300 MG PO TAB
300 mg | ORAL | 0 refills | Status: DC
Start: 2016-09-08 — End: 2016-09-09
  Administered 2016-09-08: 19:00:00 300 mg via ORAL

## 2016-09-08 MED ORDER — INSULIN ASPART 100 UNIT/ML SC FLEXPEN
0-28 [IU] | Freq: Every day | SUBCUTANEOUS | 0 refills | Status: DC
Start: 2016-09-08 — End: 2016-09-09

## 2016-09-08 NOTE — Progress Notes
High Risk OB - Return Prenatal Visit  ???  S:  Tonya Ortiz is a 36 y.o. F G6P0050 @ [redacted]w[redacted]d by L=10. Estimated Date of Delivery: 10/20/16   She has painful bilateral extremity edema.  Left greater than right.   ???   Denies symptoms of preeclampsia: denies shortness of breath, headaches, changes in vision, and dizziness    f/u 8/13 triage - Pt concern for painful bilateral LE edema, presented w/ elevated BP. r/o Pre eclampsia. BLE Ultrasound negative for dvt.    Pt reports she was supposed to get at d/c, today Pt BP at 108/74. Concern for lowered BP with medical management of LE edema. ???  Needs letter for work maternity leave   ???  F/u ortho left tibial stress fracture: Pt reported that there was no fracture on XR so she stopped wearing the boot.   ???   Meds: Metformin 1000 BID, NPH 45/25  Novolog 26/20/28, Pre-natal vitamins  ???  +FM, -VB, -CTX, -LOF  ???  O: See ACOG.   See glucose logs - Y  Fasting range= 60-75  Breakfast PP range= 65-85   Lunch PP range= 79-90  Dinner PP range= 95-109   ???  Height- 165.1 cm  Weight- 183.1 kg  BMI- 67.2  ???  Gen: NAD  CV: regular rate  Lungs: Labored w/ movement on sonogram table.  Abd: soft, nttp  Ext: BL LE edema, swollen tender erythematous on 3/4 of LLE. Less tender RLE, 1/2 swollen tender erythematous  ???  ???  CAFC Sono:8/20 BPP & Growth  FHR:138  BPP: 8/8  Growth: Cephalic, posterior placenta, AFI 17.2cm WNL, EFW 2551 gm(67%)  CCAM- 3.53 x 2.26 x 2.30cm . Appears to occupy the left thorax, displacing the 4-chamber heart to the right.   ???  A: 36 y.o. F G6P005 @ [redacted]w[redacted]d by L=10. Estimated Date of Delivery: 10/20/16  cHTN  DMII  Fetus with CPAM  Morbid Obesity, BMI 65.1  Rh POS, R Immune  ???  ???  P:   DM II:???  ??? HgbA1c 6.5 (03/27/16) --> 5.2 (6/13) -->???4.8 (7/9)  ??? TSH 0.05, fT4 0.97???(2/28)   ??? Baseline 24hr urine protein 173mg  (03/27/16). Repeat 8/12 24hr urine protein 165, Cr clearance 78  ??? Maternal echo/EKG (03/27/16): NSR  ? Patient had a screening EKG however in the setting of what sounds like paroxysmal nocturnal dyspnea will get screening echo  ? Echo 6/4: wnl, EF 60%  ? May consider sleep study  ? Ophtho Appointment in [place] 05/14/16  ??? Medication regimen: ???  ? Metformin 1000mg  BID - no changes    ? Insulin Regimen:???NPH 50/30, Novolog 26/20/28???-> 8/13 NPH to 45/25, Novolog 26/20/28. Will change NPH to 45/23 today.  ??? Fetal echo - dextroposition due to CPAM.   ??? Growth [place] q4wks beginning @24  wga   ? 6/7: Breech, posterior placenta, EFW 713g (89%), AC 97%  ? 6/27: cephalic, posterior placenta, EFW 899g (37%), AC 46%  ? 7/23: cephalic, posterior placenta, EFW 1602g (56%) AC 67%  ? 8/20: Cephalic, posterior placenta, EFW 2551 gm(67%) AC 67%  ??? Weekly BPPs beginning @32  wga:   ? 8/20 8/8   ??? Delivery @39  wga  ???  cHTN:  ??? Baseline 24hr urine protein 173 (03/27/16)  ??? Meds: Labetalol 600mg  BID  ??? Recommend ASA 162mg  QD beginning between 10-16wks (d/c at 36 wks) - d/c'ed at 30 wks  ??? Today's BP: 115/88  ??? Montior BPs closely and watch for signs/symptoms of preeclampsia   ???  Reviewed increased risk of worsening hypertension, preeclampsia, growth restriction, preterm birth, and stillbirth.  ??? Growth [place] q4wks beginning @24  wga  ??? If the patient requires antihypertensives during pregnancy, we recommend weekly BPPs beginning @32  wga  ??? Delivery @37 -39 wga depending on BP control  ???  Fetus with Congenital Pulmonary Airway Malformation:  ??? Discovered on detailed [place] 5/14  ??? Follow limited US q 2 weeks  ??? Measures 3.6x3.3x3.7cm and fills left thorax with right displacement of fetal cardiac structures  ? 5/30 3.98 x 3.94 x 3.70  ? 6/13: 3.91 x 4.15 x 2.51 cm  ? 6/27: 3.77x3.40x2.39 cm   ? 7/9: 4.46x2.64x3.48cm, solid CCAM, CVR 0.8 (<1.2 low risk for hydrops)  ? 7/23: 3.09 x 3.86 x 3.34 cm, CVR 0.48 (low risk of hydrops)  ? 8/6: CCAM:4.38 x 2.45 x 4.0 cm CVR 0.78???(low risk hydrops)  ? 8/20 CCAM: 3.53 x 2.26 x 2.30cm . Appears to occupy the left thorax, displacing the 4-chamber heart to the right. ??? Growth [place] (per DM and CHTN recs)  ??? BPPs (per DM and CHTN recs)  ??? Will need a NICU and Peds surgery consult in 3rd trimester  ? NICU consult completed: 7/9  ? Peds Surgery Schedule: 7/11  ??? Seen by Dr. Vicenta Dunning - cystic adenomatoid malformation of fetus --> will continue to follow - decision of surg will be based on fetal symptoms at delivery  ???  ???  Morbid Obesity:  ??? Discussed appropriate weight gain in pregnancy (based on pre-pregnancy BMI)  ? Class III Obesity (BMI >40): -15 to 0 lb  ??? Recommend growth ultrasound q4wks beginning at 24 wga  ???  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 Normal Pap but HR HPV positive  ??? Colposcopy scheduled 7/9 - unable to perform   ??? Colposcopy was unable to be performed, needs a colposcopy 8 weeks postpartum   ? Dr. Junie Bame appt scheduled 09/11/16  ???  Pregnancy, HROB will be primary:???  ??? PNV  ??? PNLs, urine culture - wnl  ??? Cervical cancer screening - pap NSIL, +HR HPV, will need PP colpo  ??? NT @12 -13wga - not possible due to maternal body habitus, NIPs low risk   ? Carrier screening neg  ??? MSAFP - neg  ??? Detailed [place] @18 -20wga - CPAM: see above   ??? CBC, Ab screen, syph ab @28  wks - Hgb 9.4 (7/9)  ??? S/p Tdap 7/23  ??? Collect GBS @35wga    ??? PPBCM: Patient does not express interest in PPBCM  ??? Delivery - IOL @ 37/38wga  ??? Pt was sent to triage this AM for suspected cellulitis of RLE  ???  RTC in 1 wk(s)   Tonya Gravel, MD    09/08/2016 11:36 AM

## 2016-09-08 NOTE — Other
Critical result or procedure called (document test and value, and read back):  FSBS of 43  Time MD/NP Notified:  1606  MD/NP Name:  Dr. Leland Her  MD/NP Response/Orders Given:  Pt may have a diabetic diet and while waiting for food can have some crackers and peanut butter

## 2016-09-08 NOTE — Progress Notes
IV established with the use of Ultrasound, no complications, blood drawn for lab.  IV flushed without difficulty. Rutherford Limerick RN IV team

## 2016-09-08 NOTE — Progress Notes
1. Have you or your sexual partner traveled away from [place] in the last 12 weeks? No

## 2016-09-08 NOTE — Discharge Instructions - Appointments
Future Appointments  Date Time Provider Department Center   09/15/2016 2:30 PM OBGYN SONO RM 3 [facility]   09/16/2016 1:00 PM OBGYN [facility]   09/19/2016 9:00 AM OBGYN SONO RM 3 [facility]   09/26/2016 9:00 AM OBGYN SONO RM 3 [facility]

## 2016-09-08 NOTE — Progress Notes
Letter given to pt at appt today stating she will not be able to return to work effective today due to pregnancy complications.  Pt has extreme LLE edema and cellulitis.  Dr. Nedra Hai gave order for work release and signed letter.

## 2016-09-09 ENCOUNTER — Encounter: Admit: 2016-09-08 | Discharge: 2016-09-08 | Primary: Adult Health

## 2016-09-09 ENCOUNTER — Encounter: Admit: 2016-09-08 | Discharge: 2016-09-09 | Payer: MEDICAID | Primary: Adult Health

## 2016-09-09 DIAGNOSIS — O99713 Diseases of the skin and subcutaneous tissue complicating pregnancy, third trimester: Principal | ICD-10-CM

## 2016-09-09 DIAGNOSIS — Z794 Long term (current) use of insulin: ICD-10-CM

## 2016-09-09 DIAGNOSIS — Z3A34 34 weeks gestation of pregnancy: ICD-10-CM

## 2016-09-09 DIAGNOSIS — O368930 Maternal care for other specified fetal problems, third trimester, not applicable or unspecified: ICD-10-CM

## 2016-09-09 DIAGNOSIS — O163 Unspecified maternal hypertension, third trimester: ICD-10-CM

## 2016-09-09 DIAGNOSIS — Z6841 Body Mass Index (BMI) 40.0 and over, adult: ICD-10-CM

## 2016-09-09 DIAGNOSIS — Z87891 Personal history of nicotine dependence: ICD-10-CM

## 2016-09-09 DIAGNOSIS — O99213 Obesity complicating pregnancy, third trimester: ICD-10-CM

## 2016-09-09 DIAGNOSIS — E11628 Type 2 diabetes mellitus with other skin complications: ICD-10-CM

## 2016-09-09 DIAGNOSIS — Z7984 Long term (current) use of oral hypoglycemic drugs: ICD-10-CM

## 2016-09-09 DIAGNOSIS — Z7982 Long term (current) use of aspirin: ICD-10-CM

## 2016-09-09 DIAGNOSIS — O1203 Gestational edema, third trimester: ICD-10-CM

## 2016-09-09 DIAGNOSIS — L539 Erythematous condition, unspecified: ICD-10-CM

## 2016-09-09 DIAGNOSIS — O24113 Pre-existing diabetes mellitus, type 2, in pregnancy, third trimester: ICD-10-CM

## 2016-09-09 LAB — POC GLUCOSE: Lab: 118 mg/dL — ABNORMAL HIGH (ref 70–100)

## 2016-09-11 ENCOUNTER — Encounter: Admit: 2016-09-11 | Discharge: 2016-09-11 | Primary: Adult Health

## 2016-09-11 NOTE — Progress Notes
Tonya Ortiz presents for an ultrasound encounter. Past Medical, Surgical, Family & Social History; Medications & Allergies contained in the electronic record below were not reviewed today and may not be up-to-date. Please see A/S OBGYN report for all documentation related to this encounter.    09/11/2016  Butch Penny, MA

## 2016-09-11 NOTE — Progress Notes
Pt calls and states she lost the letter we gave her Monday 09/08/16 regarding work restrictions.  Pt asked letter to be faxed to [phone].  Letter faxed and confirmation received.

## 2016-09-15 ENCOUNTER — Encounter: Admit: 2016-09-15 | Discharge: 2016-09-15 | Primary: Adult Health

## 2016-09-15 ENCOUNTER — Ambulatory Visit: Admit: 2016-09-15 | Discharge: 2016-09-16 | Payer: MEDICAID | Primary: Adult Health

## 2016-09-15 DIAGNOSIS — O24113 Pre-existing diabetes mellitus, type 2, in pregnancy, third trimester: Principal | ICD-10-CM

## 2016-09-15 DIAGNOSIS — O163 Unspecified maternal hypertension, third trimester: ICD-10-CM

## 2016-09-15 MED FILL — PEN NEEDLE, DIABETIC 32 GAUGE X 5/32" MISC NDLE: 32 gauge x 5/" | 25 days supply | Qty: 100 | Fill #3 | Status: CP

## 2016-09-15 MED FILL — INSULIN ASPART 100 UNIT/ML SC FLEXPEN: 100 unit/mL (3 mL) | 21 days supply | Qty: 45 | Fill #5 | Status: CP

## 2016-09-15 MED FILL — INSULIN NPH ISOPH U-100 HUMAN 100 UNIT/ML SC SUSP: 100 unit/mL | 24 days supply | Qty: 200 | Fill #6 | Status: CP

## 2016-09-15 NOTE — Progress Notes
Tonya Ortiz presents for an ultrasound encounter. Past Medical, Surgical, Family & Social History; Medications & Allergies contained in the electronic record below were not reviewed today and may not be up-to-date. Please see A/S OBGYN report for all documentation related to this encounter.    09/15/2016  Kathyrn Lass, MA

## 2016-09-16 ENCOUNTER — Ambulatory Visit: Admit: 2016-09-16 | Discharge: 2016-09-16 | Payer: MEDICAID | Primary: Adult Health

## 2016-09-16 ENCOUNTER — Encounter: Admit: 2016-09-16 | Discharge: 2016-09-16 | Primary: Adult Health

## 2016-09-16 DIAGNOSIS — Z3A35 35 weeks gestation of pregnancy: Principal | ICD-10-CM

## 2016-09-16 DIAGNOSIS — E669 Obesity, unspecified: ICD-10-CM

## 2016-09-16 DIAGNOSIS — O0993 Supervision of high risk pregnancy, unspecified, third trimester: ICD-10-CM

## 2016-09-16 DIAGNOSIS — E119 Type 2 diabetes mellitus without complications: Principal | ICD-10-CM

## 2016-09-16 DIAGNOSIS — O163 Unspecified maternal hypertension, third trimester: ICD-10-CM

## 2016-09-16 DIAGNOSIS — I10 Essential (primary) hypertension: ICD-10-CM

## 2016-09-16 DIAGNOSIS — O24113 Pre-existing diabetes mellitus, type 2, in pregnancy, third trimester: ICD-10-CM

## 2016-09-16 NOTE — Progress Notes
1. Have you or your sexual partner traveled away from [place] in the last 12 weeks? No

## 2016-09-17 ENCOUNTER — Encounter: Admit: 2016-09-17 | Discharge: 2016-09-16 | Payer: MEDICAID | Primary: Adult Health

## 2016-09-18 LAB — CULTURE-STREP SCREEN

## 2016-09-19 ENCOUNTER — Ambulatory Visit: Admit: 2016-09-19 | Discharge: 2016-09-20 | Payer: MEDICAID | Primary: Adult Health

## 2016-09-19 ENCOUNTER — Inpatient Hospital Stay: Admit: 2016-09-19 | Discharge: 2016-09-19 | Primary: Adult Health

## 2016-09-19 ENCOUNTER — Encounter: Admit: 2016-09-19 | Discharge: 2016-09-19 | Primary: Adult Health

## 2016-09-19 ENCOUNTER — Encounter: Admit: 2016-09-19 | Discharge: 2016-09-20 | Primary: Adult Health

## 2016-09-19 DIAGNOSIS — I10 Essential (primary) hypertension: ICD-10-CM

## 2016-09-19 DIAGNOSIS — E119 Type 2 diabetes mellitus without complications: Principal | ICD-10-CM

## 2016-09-19 DIAGNOSIS — O163 Unspecified maternal hypertension, third trimester: ICD-10-CM

## 2016-09-19 DIAGNOSIS — E669 Obesity, unspecified: ICD-10-CM

## 2016-09-19 DIAGNOSIS — O24113 Pre-existing diabetes mellitus, type 2, in pregnancy, third trimester: Principal | ICD-10-CM

## 2016-09-19 MED ORDER — MISOPROSTOL 200 MCG PO TAB
800 ug | Freq: Once | RECTAL | 0 refills | Status: DC
Start: 2016-09-19 — End: 2016-09-21

## 2016-09-19 MED ORDER — INSULIN ASPART 100 UNIT/ML SC FLEXPEN
20 [IU] | Freq: Every day | SUBCUTANEOUS | 0 refills | Status: DC
Start: 2016-09-19 — End: 2016-09-21

## 2016-09-19 MED ORDER — INSULIN ASPART 100 UNIT/ML SC FLEXPEN
26 [IU] | Freq: Every day | SUBCUTANEOUS | 0 refills | Status: DC
Start: 2016-09-19 — End: 2016-09-21
  Administered 2016-09-20: 15:00:00 26 [IU] via SUBCUTANEOUS

## 2016-09-19 MED ORDER — DOCUSATE SODIUM 100 MG PO CAP
200 mg | Freq: Every day | ORAL | 0 refills | Status: DC
Start: 2016-09-19 — End: 2016-09-21
  Administered 2016-09-20: 15:00:00 200 mg via ORAL

## 2016-09-19 MED ORDER — LABETALOL 300 MG PO TAB
600 mg | ORAL | 0 refills | Status: DC
Start: 2016-09-19 — End: 2016-09-21
  Administered 2016-09-20 (×2): 600 mg via ORAL

## 2016-09-19 MED ORDER — LABETALOL 5 MG/ML IV SYRG
10 mg | Freq: Once | INTRAVENOUS | 0 refills | Status: CP
Start: 2016-09-19 — End: ?

## 2016-09-19 MED ORDER — SODIUM CITRATE-CITRIC ACID 500-334 MG/5 ML PO SOLN
30 mL | Freq: Once | ORAL | 0 refills | Status: AC | PRN
Start: 2016-09-19 — End: ?

## 2016-09-19 MED ORDER — LABETALOL 300 MG PO TAB
300 mg | Freq: Once | ORAL | 0 refills | Status: CP
Start: 2016-09-19 — End: ?
  Administered 2016-09-20: 05:00:00 300 mg via ORAL

## 2016-09-19 MED ORDER — CYCLOBENZAPRINE 10 MG PO TAB
10 mg | Freq: Three times a day (TID) | ORAL | 0 refills | Status: DC | PRN
Start: 2016-09-19 — End: 2016-09-21
  Administered 2016-09-20 (×2): 10 mg via ORAL

## 2016-09-19 MED ORDER — ASPIRIN 325 MG PO TAB
325 mg | Freq: Every day | ORAL | 0 refills | Status: DC
Start: 2016-09-19 — End: 2016-09-20

## 2016-09-19 MED ORDER — LABETALOL 5 MG/ML IV SYRG
10 mg | Freq: Once | INTRAVENOUS | 0 refills | Status: CP
Start: 2016-09-19 — End: ?
  Administered 2016-09-20: 02:00:00 10 mg via INTRAVENOUS

## 2016-09-19 MED ORDER — OXYTOCIN IN 0.9 % SOD CHLORIDE 30 UNIT/500 ML IV SOLN
30 [IU] | Freq: Once | INTRAVENOUS | 0 refills | Status: DC
Start: 2016-09-19 — End: 2016-09-21

## 2016-09-19 MED ORDER — INSULIN ASPART 100 UNIT/ML SC FLEXPEN
28 [IU] | Freq: Every day | SUBCUTANEOUS | 0 refills | Status: DC
Start: 2016-09-19 — End: 2016-09-21

## 2016-09-19 MED ORDER — METFORMIN 500 MG PO TAB
1000 mg | Freq: Two times a day (BID) | ORAL | 0 refills | Status: DC
Start: 2016-09-19 — End: 2016-09-21
  Administered 2016-09-20 (×2): 1000 mg via ORAL

## 2016-09-19 MED ORDER — NPH INSULIN HUMAN RECOMB 100 UNIT/ML (3 ML) SC PEN
23 [IU] | Freq: Every evening | SUBCUTANEOUS | 0 refills | Status: DC
Start: 2016-09-19 — End: 2016-09-21
  Administered 2016-09-20: 06:00:00 23 [IU] via SUBCUTANEOUS

## 2016-09-19 MED ORDER — NPH INSULIN HUMAN RECOMB 100 UNIT/ML (3 ML) SC PEN
45 [IU] | Freq: Every day | SUBCUTANEOUS | 0 refills | Status: DC
Start: 2016-09-19 — End: 2016-09-21

## 2016-09-19 MED ORDER — PRENATAL VIT-IRON FUM-FOLIC AC 28 MG IRON- 800 MCG PO TAB
1 | Freq: Every day | ORAL | 0 refills | Status: DC
Start: 2016-09-19 — End: 2016-09-21
  Administered 2016-09-20: 15:00:00 1 via ORAL

## 2016-09-19 MED ORDER — LABETALOL 300 MG PO TAB
300 mg | Freq: Two times a day (BID) | ORAL | 0 refills | Status: DC
Start: 2016-09-19 — End: 2016-09-20
  Administered 2016-09-20: 03:00:00 300 mg via ORAL

## 2016-09-19 NOTE — Progress Notes
Due to High Risk pregnancy condition patient is released from work starting 09/09/16 [facility]( FMLA).  FMLA paperwork completed, signed, and faxed to patient employer.

## 2016-09-20 ENCOUNTER — Inpatient Hospital Stay
Admit: 2016-09-20 | Discharge: 2016-09-21 | Disposition: A | Discharge status: Home / Self Care | Payer: MEDICAID | Attending: Maternal & Fetal Medicine | Admitting: Maternal & Fetal Medicine

## 2016-09-20 ENCOUNTER — Encounter: Admit: 2016-09-20 | Discharge: 2016-09-20 | Primary: Adult Health

## 2016-09-20 DIAGNOSIS — O99820 Streptococcus B carrier state complicating pregnancy: ICD-10-CM

## 2016-09-20 DIAGNOSIS — O169 Unspecified maternal hypertension, unspecified trimester: ICD-10-CM

## 2016-09-20 DIAGNOSIS — Z3483 Encounter for supervision of other normal pregnancy, third trimester: ICD-10-CM

## 2016-09-20 LAB — COMPREHENSIVE METABOLIC PANEL
Lab: 0.4 mg/dL (ref 0.3–1.2)
Lab: 0.7 mg/dL (ref 0.4–1.00)
Lab: 10 (ref 3–12)
Lab: 11 U/L (ref 7–40)
Lab: 12 mg/dL (ref 7–25)
Lab: 135 MMOL/L — ABNORMAL LOW (ref 137–147)
Lab: 21 MMOL/L (ref 21–30)
Lab: 3.5 g/dL (ref 3.5–5.0)
Lab: 68 mg/dL — ABNORMAL LOW (ref 70–100)
Lab: 75 U/L (ref 25–110)
Lab: 9 U/L (ref 7–56)
Lab: 9.1 mg/dL (ref 8.5–10.6)
Lab: >60 mL/min (ref >60)
Lab: >60 mL/min (ref >60)

## 2016-09-20 LAB — URINALYSIS DIPSTICK POC
Lab: 7 (ref 5.0–8.0)
Lab: NEGATIVE
Lab: NEGATIVE
Lab: NEGATIVE
Lab: NEGATIVE
Lab: NEGATIVE
Lab: NEGATIVE

## 2016-09-20 LAB — CBC
Lab: 10 g/dL — ABNORMAL LOW (ref 12.0–15.0)
Lab: 30 % — ABNORMAL LOW (ref 36–45)
Lab: 8.7 10*3/uL (ref 4.5–11.0)
Lab: 93 FL (ref 80–100)

## 2016-09-20 LAB — SYPHILIS AB SCREEN: Lab: NEGATIVE M/UL — ABNORMAL LOW (ref 4.0–5.0)

## 2016-09-20 LAB — LDH-LACTATE DEHYDROGENASE: Lab: 167 U/L (ref 100–210)

## 2016-09-20 LAB — DIRECT EXAM (WET PREP)

## 2016-09-20 LAB — URIC ACID: Lab: 5.9 mg/dL (ref 2.0–7.0)

## 2016-09-20 LAB — POC GLUCOSE
Lab: 118 mg/dL — ABNORMAL HIGH (ref 70–100)
Lab: 123 mg/dL — ABNORMAL HIGH (ref 70–100)
Lab: 70 mg/dL (ref 70–100)
Lab: 93 mg/dL — ABNORMAL LOW (ref 70–100)
Lab: 95 mg/dL (ref 70–100)
Lab: 96 mg/dL (ref 70–100)

## 2016-09-20 MED ORDER — ASPIRIN 81 MG PO TBEC
162 mg | Freq: Every day | ORAL | 0 refills | Status: DC
Start: 2016-09-20 — End: 2016-09-21
  Administered 2016-09-20: 15:00:00 162 mg via ORAL

## 2016-09-20 MED ADMIN — LABETALOL 5 MG/ML IV SYRG [86579]: 10 mg | INTRAVENOUS | @ 02:00:00 | Stop: 2016-09-20 | NDC 69374094604

## 2016-09-20 NOTE — Progress Notes
2015: Dr Dewitt HoesGorman and Dr. Jerelyn CharlesAbicht to bedside to perform u/s to find FHT. FHTs found and u/s placed.   2025: Orders for 20mg  IV labetalol  2030: Dr Dewitt HoesGorman at bedside to perform wet prep and SVE.   2100: Dr. Dewitt HoesGorman updated on pt BG, BP and urine dipstick.   2115: Plans to admit under HROB.   2206: Dr. Dewitt HoesGorman notified of pt current BP . Orders for 300mg  PO labetalol.   2225: Report given to K. Maisie Fushomas, RN. Care relinquished.

## 2016-09-20 NOTE — Anesthesia Pre-Procedure Evaluation
Anesthesia Pre-Procedure Evaluation    Name: Tonya Ortiz      MRN: [id number]     DOB: 02-05-1980     Age: 36 y.o.     Sex: female   __________________________________________________________________________     Procedure Date: 09/19/2016   Procedure: * No procedures listed *     Physical Assessment  Vital Signs (last filed in past 24 hours):  BP: 158/89 (08/31 2215)  Temp: 36.7 ???C (98 ???F) (08/31 2030)  Pulse: 95 (08/31 2215)  SpO2: 99 % (08/31 2215)  Height: 165.1 cm (65) (08/31 2030)  Weight: 187.3 kg (413 lb) (08/31 2030)      Patient History  Allergies   Allergen Reactions   ??? Latex HIVES        Current Medications    Medication Directions   aspirin 325 mg tablet Take 325 mg by mouth daily. Take with food.   cyclobenzaprine (FLEXERIL) 10 mg tablet Take 1 tablet by mouth three times daily as needed for Muscle Cramps.   docusate (COLACE) 100 mg capsule Take 200 mg by mouth daily.   insulin aspart U-100 (NOVOLOG FLEXPEN U-100 INSULIN) 100 unit/mL injection PEN Inject under the skin at mealtimes: 25 units with breakfast, 20 units with lunch, 26 units with dinner.  Patient taking differently: Inject under the skin at mealtimes: 26 units with breakfast, 20 units with lunch, 28 units with dinner.   insulin NPH (NOVOLIN N NPH U-100 INSULIN) 100 unit/mL injection Inject under the skin 53 units with breakfast, 29 units at bedtime.  Patient taking differently: Inject under the skin 45 units with breakfast, 25 units at bedtime.   insulin pen needles (disposable) (B-D NANO; ULTICARE MICRO) 32 gauge x 5/32 pen needle use 3 times daily and as needed  as directed   insulin pen needles (disposable) (BD UF NANO PEN NEEDLES) 32 gauge x 5/32 pen needle Use 1 each as directed as Needed. Use with insulin injections.   Insulin Syringe-Needle U-100 0.5 mL 31 gauge x 5/16 syrg Use as directed to inject insulin.   labetalol (NORMODYNE) 300 mg tablet Take two tablets by mouth three times daily metFORMIN (GLUCOPHAGE) 1,000 mg tablet Take 1 tablet by mouth twice daily with meals.   PRENATAL VIT CALC,IRON,FOLIC (PRENATAL VITAMIN PO) Take  by mouth.         Review of Systems/Medical History      Patient summary reviewed  Nursing notes reviewed  Pertinent labs reviewed    PONV Screening: Female gender and Non-smoker  No history of anesthetic complications  No family history of anesthetic complications      Airway - negative        Pulmonary - negative          No indications/hx of asthma      Cardiovascular         Exercise tolerance: <4 METS      Beta Blocker therapy: No      Beta blockers within 24 hours: n/a        Hypertension, poorly controlled      No palpitations      No syncope      GI/Hepatic/Renal           GERD, well controlled      No nausea      Neuro/Psych         Headaches (PTA)      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    Abdominal Findings:       Obese    Neurological Findings: Negative      Normal mental status       Diagnostic Tests  Hematology:   Lab Results   Component Value Date    HGB 9.2 09/08/2016    HCT 26.4 09/08/2016    PLTCT 226 09/08/2016    WBC 5.5 09/08/2016    NEUT 64 09/08/2016    ANC 3.50 09/08/2016    ALC 1.70 09/08/2016    MONA 5 09/08/2016    AMC 0.30 09/08/2016    EOSA 0 09/08/2016    ABC 0.00 09/08/2016    MCV 94.4 09/08/2016    MCH 33.0 09/08/2016    MCHC 34.9 09/08/2016    MPV 8.0 09/08/2016    RDW 13.9 09/08/2016 General Chemistry:   Lab Results   Component Value Date    NA 135 09/01/2016    K 4.1 09/01/2016    CL 105 09/01/2016    CO2 24 09/01/2016    GAP 6 09/01/2016    BUN 14 09/01/2016    CR 0.84 09/01/2016    GLU 87 09/01/2016    CA 9.6 09/01/2016    ALBUMIN 3.5 09/01/2016    TOTBILI 0.3 09/01/2016      Coagulation: No results found for: PT, PTT, INR      Anesthesia Plan    ASA score: 3     NPO status: full stomach      Informed Consent  Anesthetic plan and risks discussed with patient.  Use of blood products discussed with patient  Blood Consent: consented      Plan discussed with: resident.  Comments: (Discussed and consented for neuraxial block with backup general anesthesia. Questions answered.)

## 2016-09-21 DIAGNOSIS — Z3A35 35 weeks gestation of pregnancy: ICD-10-CM

## 2016-09-21 DIAGNOSIS — Z6841 Body Mass Index (BMI) 40.0 and over, adult: ICD-10-CM

## 2016-09-21 DIAGNOSIS — O24913 Unspecified diabetes mellitus in pregnancy, third trimester: ICD-10-CM

## 2016-09-21 DIAGNOSIS — Z794 Long term (current) use of insulin: ICD-10-CM

## 2016-09-21 DIAGNOSIS — O26893 Other specified pregnancy related conditions, third trimester: ICD-10-CM

## 2016-09-21 DIAGNOSIS — O99213 Obesity complicating pregnancy, third trimester: ICD-10-CM

## 2016-09-21 DIAGNOSIS — O163 Unspecified maternal hypertension, third trimester: Principal | ICD-10-CM

## 2016-09-21 DIAGNOSIS — O99820 Streptococcus B carrier state complicating pregnancy: ICD-10-CM

## 2016-09-21 DIAGNOSIS — R1084 Generalized abdominal pain: ICD-10-CM

## 2016-09-21 NOTE — Case Management (ED)
ON CALL SW NOTE:    Received call for transport for Pt.  Pt with dc orders and ready for dc.  SW contacted [facility] for ride set up.  They will call RN 15 minutes prior to arrival for Pt to be taking to lobby for her ride.    [facility] : [phone]  Trip # [id number]    Lew DawesKate Bryton Waight LMSW  [phone]

## 2016-09-23 ENCOUNTER — Encounter: Admit: 2016-09-23 | Discharge: 2016-09-23 | Primary: Adult Health

## 2016-09-23 ENCOUNTER — Ambulatory Visit: Admit: 2016-09-23 | Discharge: 2016-09-24 | Payer: MEDICAID | Primary: Adult Health

## 2016-09-23 DIAGNOSIS — O24113 Pre-existing diabetes mellitus, type 2, in pregnancy, third trimester: Principal | ICD-10-CM

## 2016-09-23 DIAGNOSIS — O10913 Unspecified pre-existing hypertension complicating pregnancy, third trimester: Principal | ICD-10-CM

## 2016-09-23 DIAGNOSIS — E119 Type 2 diabetes mellitus without complications: Principal | ICD-10-CM

## 2016-09-23 DIAGNOSIS — E669 Obesity, unspecified: ICD-10-CM

## 2016-09-23 DIAGNOSIS — O99820 Streptococcus B carrier state complicating pregnancy: ICD-10-CM

## 2016-09-23 DIAGNOSIS — I10 Essential (primary) hypertension: ICD-10-CM

## 2016-09-23 DIAGNOSIS — O24111 Pre-existing diabetes mellitus, type 2, in pregnancy, first trimester: ICD-10-CM

## 2016-09-23 DIAGNOSIS — O0993 Supervision of high risk pregnancy, unspecified, third trimester: ICD-10-CM

## 2016-09-23 DIAGNOSIS — O163 Unspecified maternal hypertension, third trimester: ICD-10-CM

## 2016-09-23 NOTE — Progress Notes
1. Have you or your sexual partner traveled away from [place] in the last 12 weeks? No

## 2016-09-24 ENCOUNTER — Encounter: Admit: 2016-09-24 | Discharge: 2016-09-24 | Primary: Adult Health

## 2016-09-24 DIAGNOSIS — I10 Essential (primary) hypertension: ICD-10-CM

## 2016-09-24 DIAGNOSIS — E119 Type 2 diabetes mellitus without complications: Principal | ICD-10-CM

## 2016-09-24 DIAGNOSIS — E669 Obesity, unspecified: ICD-10-CM

## 2016-09-24 NOTE — Progress Notes
High Risk OB - Return Prenatal Visit  ???  S:  Tonya Ortiz is a 36 y.o.???F G6P0050???@ [redacted]w[redacted]d???by L=10. Estimated Date of Delivery: 10/20/16  ???  Denies symptoms of preeclampsia: denies shortness of breath, headaches, changes in vision, and dizziness.  F/u hospital visit on 8/31 for R sided abdominal pain, which has resolved.  ???  +FM, -VB, -CTX, -LOF  ???  O: See ACOG.   Sugar sheet reviewed  Fastings 71-123 (3/7 elevated)  Breakfast PP 75-125 (1/7 elevated)  Lunch PP 75-99 (0/7 elevated)  Dinner PP 95-109 (0/7 elevated)  ???  Vitals:    09/23/16 1425   BP: 137/87   Pulse: 96       ???  Gen: NAD  CV: regular rate  Lungs: Labored after repositioning  Abd: soft, nttp  Ext: BL LE edema, swollen tender erythematous on 3/4 of BLE  ???  ???  CAFC Sono:8/20  Growth: Cephalic, posterior placenta, AFI 17.2cm WNL, EFW 2551 gm(67%)  CCAM- 3.53 x 2.26 x 2.30cm . Appears to occupy the left thorax, displacing the 4-chamber heart to the right.   ???  A: 36 y.o.???F G6P0050???@ [redacted]w[redacted]d???by L=10. Estimated Date of Delivery: 10/20/16  cHTN  DMII  Fetus with CPAM  Morbid Obesity, BMI 65.1  Rh POS, R Immune  ???  ???  P:   DM II:???  ??? HgbA1c 6.5 (03/27/16) --> 5.2 (6/13) -->???4.8 (7/9)  ??? TSH 0.05, fT4 0.97???(2/28)   ??? Baseline 24hr urine protein 173mg  (03/27/16). Repeat 8/12 24hr urine protein 165, Cr clearance 78  ??? Maternal echo/EKG (03/27/16): NSR  ? Patient had a screening EKG however in the setting of what sounds like paroxysmal nocturnal dyspnea will get screening echo  ? Echo 6/4: wnl, EF 60%  ? May consider sleep study  ? Ophtho Appointment in [place] 05/14/16  ??? Medication regimen: ???  ? Metformin 1000mg  BID - no changes ???  ? Insulin Regimen:???NPH to 45/23, Novolog 26/20/28. No change today  ??? Fetal echo - dextroposition due to CPAM.   ???  cHTN:  ??? Baseline 24hr urine protein 173 (03/27/16)  ??? Meds: Labetalol 600mg  TID  ??? S/p ASA 162mg  QD beginning between 10-???30 wks  ??? Today's BP: Initial 137/87  ??? Montior BPs closely and watch for signs/symptoms of preeclampsia ??? Growth [place] q4wks beginning @24  wga   ? 6/7: Breech, posterior placenta, EFW 713g (89%), AC 97%  ? 6/27: cephalic, posterior placenta, EFW 899g (37%), AC 46%  ? 7/23: cephalic, posterior placenta, EFW 1602g (56%) AC 67%  ? 8/20: Cephalic, posterior placenta, EFW 2551 gm(67%) AC 67%  ??? Weekly BPPs     ???  Fetus with Congenital Pulmonary Airway Malformation:  ??? Discovered on detailed [place] 5/14  ??? Follow limited US q 2 weeks  ??? Measures 3.6x3.3x3.7cm and fills left thorax with right displacement of fetal cardiac structures  ? 5/30 3.98 x 3.94 x 3.70  ? 6/13: 3.91 x 4.15 x 2.51 cm  ? 6/27: 3.77x3.40x2.39 cm   ? 7/9: 4.46x2.64x3.48cm, solid CCAM, CVR 0.8 (<1.2 low risk for hydrops)  ? 7/23: 3.09 x 3.86 x 3.34 cm, CVR 0.48 (low risk of hydrops)  ? 8/6: CCAM:4.38 x 2.45 x 4.0 cm CVR 0.78???(low risk hydrops)  ? 8/20 CCAM: 3.53 x 2.26 x 2.30cm . Appears to occupy the left thorax, displacing the 4-chamber heart to the right.   ??? Growth [place] (per DM and CHTN recs)  ??? BPPs (per DM and CHTN recs)  ??? Will  need a NICU and Peds surgery consult in 3rd trimester  ? NICU consult completed: 7/9  ? Peds Surgery Schedule: 7/11  ??? Seen by Dr. Vicenta Dunning - cystic adenomatoid malformation of fetus --> will continue to follow - decision of surg will be based on fetal symptoms at delivery  ???  Lower extremity edema  8/13 triage - Pt concern for painful bilateral LE edema, presented w/ elevated BP. r/o Pre eclampsia. BLE Ultrasound negative for dvt.   8/20 triage - ID consult in triage for continued edema not c/w cellulitis, more likely venous stasis  ???  ???  Morbid Obesity:  ??? Discussed appropriate weight gain in pregnancy (based on pre-pregnancy BMI)  ? Class III Obesity (BMI >40): -15 to 0 lb  ??? Recommend growth ultrasound q4wks beginning at 24 wga  ???  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 Normal Pap but HR HPV positive  ??? Colposcopy scheduled 7/9 - unable to perform   ??? Colposcopy was unable to be performed, needs a colposcopy 8 weeks postpartum ? Dr. Junie Bame appt scheduled 09/11/16  ???  Pregnancy, HROB will be primary:???  ??? Bed bugs  ??? PNV  ??? PNLs, urine culture - wnl  ??? Cervical cancer screening - pap NSIL, +HR HPV, will need PP colpo  ??? NT @12 -13wga - not possible due to maternal body habitus, NIPs low risk   ? Carrier screening neg  ??? MSAFP - neg  ??? Detailed [place] @18 -20wga - CPAM: see above   ??? CBC, Ab screen, syph ab @28  wks - Hgb 9.4 (7/9)  ??? S/p Tdap 7/23  ??? GBS Positive  ??? PPBCM: Assessed patient's???desires???for future fertility today. After Tonya Ortiz???is born she would like one more baby in the next 5 years. We discussed pregnancy spacing and that she is an good???candidate for a LARC such as IUD. In particular we discussed the Mirena IUD which would provide several???benefits including desired contraceptionand regulation of periods. In addition we discussed the benefit of Mirena IUD for a patient with risk factors for endometrial hyperplasia such as her diabetes and morbid obesity.  ??? Delivery - IOL @ 39wga (on 9/24) due to congenital malformation. This timing of delivery is also in keeping with her maternal co-morbidities. She desires and epidural and met with anesthesia while in triage 8/20.  ??? HR Primary - Please page Dr. Daphane Shepherd for Delivery     Daine Gravel, MD    09/24/2016 8:47 AM

## 2016-09-29 ENCOUNTER — Ambulatory Visit: Admit: 2016-09-29 | Discharge: 2016-09-30 | Payer: MEDICAID | Primary: Adult Health

## 2016-09-29 ENCOUNTER — Encounter: Admit: 2016-09-29 | Discharge: 2016-09-29 | Primary: Adult Health

## 2016-09-29 ENCOUNTER — Inpatient Hospital Stay: Admit: 2016-09-29 | Discharge: 2016-09-29 | Primary: Adult Health

## 2016-09-29 ENCOUNTER — Encounter: Admit: 2016-09-29 | Discharge: 2016-09-30 | Primary: Adult Health

## 2016-09-29 DIAGNOSIS — E119 Type 2 diabetes mellitus without complications: Principal | ICD-10-CM

## 2016-09-29 DIAGNOSIS — I10 Essential (primary) hypertension: ICD-10-CM

## 2016-09-29 DIAGNOSIS — O163 Unspecified maternal hypertension, third trimester: ICD-10-CM

## 2016-09-29 DIAGNOSIS — O10913 Unspecified pre-existing hypertension complicating pregnancy, third trimester: ICD-10-CM

## 2016-09-29 DIAGNOSIS — E669 Obesity, unspecified: ICD-10-CM

## 2016-09-29 DIAGNOSIS — R300 Dysuria: ICD-10-CM

## 2016-09-29 DIAGNOSIS — Z3483 Encounter for supervision of other normal pregnancy, third trimester: ICD-10-CM

## 2016-09-29 DIAGNOSIS — O99820 Streptococcus B carrier state complicating pregnancy: Principal | ICD-10-CM

## 2016-09-29 DIAGNOSIS — O24113 Pre-existing diabetes mellitus, type 2, in pregnancy, third trimester: Principal | ICD-10-CM

## 2016-09-29 DIAGNOSIS — O0993 Supervision of high risk pregnancy, unspecified, third trimester: ICD-10-CM

## 2016-09-29 LAB — COMPREHENSIVE METABOLIC PANEL
Lab: 0.5 mg/dL (ref 0.3–1.2)
Lab: 0.7 mg/dL (ref 0.4–1.00)
Lab: 11 U/L (ref 7–56)
Lab: 135 MMOL/L — ABNORMAL LOW (ref 137–147)
Lab: 19 U/L (ref 7–40)
Lab: 25 MMOL/L (ref 21–30)
Lab: 3.2 g/dL — ABNORMAL LOW (ref 3.5–5.0)
Lab: 7 g/dL (ref 6.0–8.0)
Lab: 88 U/L (ref 25–110)
Lab: 9.1 mg/dL (ref 8.5–10.6)
Lab: >60 mL/min (ref >60)
Lab: >60 mL/min (ref >60)
Lab: 9 (ref 3–12)

## 2016-09-29 LAB — URIC ACID: Lab: 4.8 mg/dL (ref 2.0–7.0)

## 2016-09-29 LAB — LDH-LACTATE DEHYDROGENASE: Lab: 250 U/L — ABNORMAL HIGH (ref 100–210)

## 2016-09-29 LAB — FIBRINOGEN: Lab: 862 mg/dL — ABNORMAL HIGH (ref 200–400)

## 2016-09-29 LAB — PROTEIN/CR RATIO,UR RAN
Lab: 0.3 mg/dL (ref 7–25)
Lab: 64 mg/dL (ref 98–110)

## 2016-09-29 LAB — POC GLUCOSE
Lab: 112 mg/dL — ABNORMAL HIGH (ref 70–100)
Lab: 108 mg/dL — ABNORMAL HIGH (ref 70–100)
Lab: 101 mg/dL — ABNORMAL HIGH (ref 70–100)

## 2016-09-29 LAB — PTT (APTT): Lab: 25 s (ref 20.0–36.0)

## 2016-09-29 LAB — CBC
Lab: 12 10*3/uL — ABNORMAL HIGH (ref 4.5–11.0)
Lab: 328 10*3/uL (ref 150–400)

## 2016-09-29 MED ORDER — MAGNESIUM SULFATE IN WATER 4 GRAM/50 ML (8 %) IV PGBK
4 g | Freq: Once | INTRAVENOUS | 0 refills | Status: CP
Start: 2016-09-29 — End: ?

## 2016-09-29 MED ORDER — OXYTOCIN IN 0.9 % SOD CHLORIDE 30 UNIT/500 ML IV SOLN (OR)
0 refills | Status: DC
Start: 2016-09-29 — End: 2016-09-30
  Administered 2016-09-30 (×2): via INTRAVENOUS

## 2016-09-29 MED ORDER — SUCCINYLCHOLINE CHLORIDE 20 MG/ML IJ SOLN
0 refills | Status: DC
Start: 2016-09-29 — End: 2016-09-30
  Administered 2016-09-30: 140 mg via INTRAVENOUS

## 2016-09-29 MED ORDER — OXYCODONE 5 MG PO TAB
5-10 mg | ORAL | 0 refills | Status: DC | PRN
Start: 2016-09-29 — End: 2016-10-02
  Administered 2016-09-30 – 2016-10-02 (×4): 5 mg via ORAL

## 2016-09-29 MED ORDER — MORPHINE IN 0.9 % NACL 55 MG/55 ML (1 MG/ML) IJ SPCA (COPY)
INTRAVENOUS | 0 refills | Status: DC
Start: 2016-09-29 — End: 2016-09-30
  Administered 2016-09-30: 06:00:00 55.000 mL via INTRAVENOUS

## 2016-09-29 MED ORDER — LABETALOL 5 MG/ML IV SYRG
20 mg | Freq: Once | INTRAVENOUS | 0 refills | Status: CP
Start: 2016-09-29 — End: ?

## 2016-09-29 MED ORDER — LABETALOL 300 MG PO TAB
300 mg | Freq: Three times a day (TID) | ORAL | 0 refills | Status: DC
Start: 2016-09-29 — End: 2016-09-29
  Administered 2016-09-29: 18:00:00 300 mg via ORAL

## 2016-09-29 MED ORDER — ONDANSETRON HCL (PF) 4 MG/2 ML IJ SOLN
0 refills | Status: DC
Start: 2016-09-29 — End: 2016-09-30
  Administered 2016-09-29: 4 mg via INTRAVENOUS

## 2016-09-29 MED ORDER — METFORMIN 500 MG PO TAB
1000 mg | Freq: Two times a day (BID) | ORAL | 0 refills | Status: DC
Start: 2016-09-29 — End: 2016-10-02
  Administered 2016-09-30 – 2016-10-02 (×4): 1000 mg via ORAL

## 2016-09-29 MED ORDER — LIDOCAINE-EPINEPHRINE (PF) 2 %-1:200,000 IJ SOLN
0 refills | Status: DC
Start: 2016-09-29 — End: 2016-09-30
  Administered 2016-09-30: 3 mL via EPIDURAL

## 2016-09-29 MED ORDER — CYCLOBENZAPRINE 10 MG PO TAB
10 mg | Freq: Three times a day (TID) | ORAL | 0 refills | Status: DC | PRN
Start: 2016-09-29 — End: 2016-10-02

## 2016-09-29 MED ORDER — OXYTOCIN IN 0.9 % SOD CHLORIDE 30 UNIT/500 ML IV SOLN
30 [IU] | Freq: Once | INTRAVENOUS | 0 refills | Status: DC
Start: 2016-09-29 — End: 2016-09-30

## 2016-09-29 MED ORDER — METFORMIN 500 MG PO TAB
1000 mg | Freq: Two times a day (BID) | ORAL | 0 refills | Status: DC
Start: 2016-09-29 — End: 2016-09-29

## 2016-09-29 MED ORDER — LABETALOL 5 MG/ML IV SYRG
40 mg | Freq: Once | INTRAVENOUS | 0 refills | Status: CP
Start: 2016-09-29 — End: ?

## 2016-09-29 MED ORDER — LEVONORGESTREL 20 MCG/24 HR (5 YEARS) IU IUD
1 | Freq: Once | INTRAUTERINE | 0 refills | Status: CP
Start: 2016-09-29 — End: ?
  Administered 2016-09-30: 01:00:00 1 via INTRAUTERINE

## 2016-09-29 MED ORDER — FENTANYL CITRATE (PF) 50 MCG/ML IJ SOLN
0 refills | Status: DC
Start: 2016-09-29 — End: 2016-09-30
  Administered 2016-09-29: 23:00:00 10 ug via INTRATHECAL
  Administered 2016-09-30: 02:00:00 50 ug via INTRAVENOUS
  Administered 2016-09-30: 01:00:00 40 ug via INTRAVENOUS

## 2016-09-29 MED ORDER — LABETALOL 300 MG PO TAB
300 mg | Freq: Two times a day (BID) | ORAL | 0 refills | Status: DC
Start: 2016-09-29 — End: 2016-09-29

## 2016-09-29 MED ORDER — LABETALOL 300 MG PO TAB
600 mg | Freq: Three times a day (TID) | ORAL | 0 refills | Status: DC
Start: 2016-09-29 — End: 2016-09-30
  Administered 2016-09-30 (×3): 600 mg via ORAL

## 2016-09-29 MED ORDER — LABETALOL 300 MG PO TAB
300 mg | Freq: Once | ORAL | 0 refills | Status: CP
Start: 2016-09-29 — End: ?
  Administered 2016-09-29: 22:00:00 300 mg via ORAL

## 2016-09-29 MED ORDER — MISOPROSTOL 200 MCG PO TAB
800 ug | Freq: Once | RECTAL | 0 refills | Status: DC
Start: 2016-09-29 — End: 2016-09-30

## 2016-09-29 MED ORDER — BUPIVACAINE (PF) 0.75 % (7.5 MG/ML) IJ SOLN
0 refills | Status: DC
Start: 2016-09-29 — End: 2016-09-30
  Administered 2016-09-29: 23:00:00 1.6 mL via INTRASPINAL

## 2016-09-29 MED ORDER — ACETAMINOPHEN 325 MG PO TAB
650 mg | ORAL | 0 refills | Status: DC
Start: 2016-09-29 — End: 2016-10-02
  Administered 2016-09-30 – 2016-10-02 (×9): 650 mg via ORAL

## 2016-09-29 MED ORDER — FAMOTIDINE (PF) 20 MG/2 ML IV SOLN
INTRAVENOUS | 0 refills | Status: DC
Start: 2016-09-29 — End: 2016-09-30
  Administered 2016-09-29: 23:00:00 20 mg via INTRAVENOUS

## 2016-09-29 MED ORDER — MAGNESIUM SULFATE IN WATER 20 GRAM/500 ML (4 %) IV SOLP
1 g/h | INTRAVENOUS | 0 refills | Status: AC
Start: 2016-09-29 — End: ?
  Administered 2016-09-30: 18:00:00 1 g/h via INTRAVENOUS

## 2016-09-29 MED ORDER — NIFEDIPINE 10 MG PO CAP
10 mg | Freq: Once | ORAL | 0 refills | Status: CP
Start: 2016-09-29 — End: ?
  Administered 2016-09-29: 23:00:00 10 mg via ORAL

## 2016-09-29 MED ORDER — CHLOROPROCAINE (PF) 30 MG/ML (3 %) IJ SOLN
0 refills | Status: DC
Start: 2016-09-29 — End: 2016-09-30
  Administered 2016-09-30: 7 mL via EPIDURAL

## 2016-09-29 MED ORDER — NIFEDIPINE 60 MG PO TR24
60 mg | Freq: Every evening | ORAL | 0 refills | Status: DC
Start: 2016-09-29 — End: 2016-10-02
  Administered 2016-09-30 – 2016-10-02 (×3): 60 mg via ORAL

## 2016-09-29 MED ORDER — LACTATED RINGERS IV SOLP
INTRAVENOUS | 0 refills | Status: DC
Start: 2016-09-29 — End: 2016-09-30
  Administered 2016-09-29: 20:00:00 1000.000 mL via INTRAVENOUS

## 2016-09-29 MED ORDER — FENTANYL CITRATE (PF) 50 MCG/ML IJ SOLN
50 ug | Freq: Once | INTRAVENOUS | 0 refills | Status: CP
Start: 2016-09-29 — End: ?

## 2016-09-29 MED ORDER — SODIUM CITRATE-CITRIC ACID 500-334 MG/5 ML PO SOLN
30 mL | Freq: Once | ORAL | 0 refills | Status: CP | PRN
Start: 2016-09-29 — End: ?
  Administered 2016-09-29: 23:00:00 30 mL via ORAL

## 2016-09-29 MED ORDER — PHENYLEPHRINE IN 0.9% NACL(PF) 2 MG/20 ML (100 MCG/ML) IV SYRG (INFUSION)(AM)(OR)
0 refills | Status: DC
Start: 2016-09-29 — End: 2016-09-30
  Administered 2016-09-29: 23:00:00 0.4 ug/kg/min via INTRAVENOUS

## 2016-09-29 MED ORDER — LACTATED RINGERS IV SOLP
0 refills | Status: DC
Start: 2016-09-29 — End: 2016-09-30
  Administered 2016-09-29 – 2016-09-30 (×2): via INTRAVENOUS

## 2016-09-29 MED ORDER — CEFAZOLIN 1 GRAM IJ SOLR
INTRAVENOUS | 0 refills | Status: DC
Start: 2016-09-29 — End: 2016-09-30
  Administered 2016-09-29: 3 g via INTRAVENOUS

## 2016-09-29 MED ORDER — INSULIN 100UNITS NS 100ML
1-32 [IU]/h | INTRAVENOUS | 0 refills | Status: DC
Start: 2016-09-29 — End: 2016-09-30
  Administered 2016-09-29 (×2): 1 [IU]/h via INTRAVENOUS

## 2016-09-29 MED ORDER — PROPOFOL INJ 10 MG/ML IV VIAL
0 refills | Status: DC
Start: 2016-09-29 — End: 2016-09-30
  Administered 2016-09-30: 200 mg via INTRAVENOUS

## 2016-09-29 MED ORDER — LACTATED RINGERS IV SOLP
INTRAVENOUS | 0 refills | Status: AC
Start: 2016-09-29 — End: ?
  Administered 2016-09-30 (×3): 1000.000 mL via INTRAVENOUS

## 2016-09-29 MED ORDER — MIDAZOLAM 1 MG/ML IJ SOLN
INTRAVENOUS | 0 refills | Status: DC
Start: 2016-09-29 — End: 2016-09-30
  Administered 2016-09-30: 2 mg via INTRAVENOUS

## 2016-09-29 MED ORDER — AZITHROMYCIN 500 MG IVPB (AN)(OSM)
INTRAVENOUS | 0 refills | Status: DC
Start: 2016-09-29 — End: 2016-09-30
  Administered 2016-09-29 (×2): 500 mg via INTRAVENOUS

## 2016-09-29 MED ORDER — OXYTOCIN IN 0.9 % SOD CHLORIDE 30 UNIT/500 ML IV SOLN
30 [IU] | Freq: Once | INTRAVENOUS | 0 refills | Status: AC | PRN
Start: 2016-09-29 — End: ?

## 2016-09-29 MED ADMIN — MAGNESIUM SULFATE IN WATER 20 GRAM/500 ML (4 %) IV SOLP [166579]: 1 g/h | INTRAVENOUS | @ 23:00:00 | Stop: 2016-09-29 | NDC 00409672903

## 2016-09-29 MED ADMIN — LABETALOL 5 MG/ML IV SYRG [86579]: 20 mg | INTRAVENOUS | @ 18:00:00 | Stop: 2016-09-29 | NDC 69374094604

## 2016-09-29 MED ADMIN — FENTANYL CITRATE (PF) 50 MCG/ML IJ SOLN [3037]: 50 ug | INTRAVENOUS | @ 20:00:00 | Stop: 2016-09-29 | NDC 00409909412

## 2016-09-29 MED ADMIN — LABETALOL 5 MG/ML IV SYRG [86579]: 40 mg | INTRAVENOUS | @ 21:00:00 | Stop: 2016-09-29 | NDC 69374094604

## 2016-09-29 MED ADMIN — MAGNESIUM SULFATE IN WATER 4 GRAM/50 ML (8 %) IV PGBK [166563]: 4 g | INTRAVENOUS | @ 23:00:00 | Stop: 2016-09-29 | NDC 63323010705

## 2016-09-29 NOTE — Progress Notes
Tonya Ortiz presents for an ultrasound encounter. Past Medical, Surgical, Family & Social History; Medications & Allergies contained in the electronic record below were not reviewed today and may not be up-to-date. Please see A/S OBGYN report for all documentation related to this encounter.    09/29/2016  Tonya LassMaria Jadis Mika, MA

## 2016-09-29 NOTE — Anesthesia Pre-Procedure Evaluation
Anesthesia Pre-Procedure Evaluation    Name: Tonya Ortiz      MRN: [id number]     DOB: 16-Nov-1980     Age: 36 y.o.     Sex: female   __________________________________________________________________________     Procedure Date: 09/29/2016   Procedure: * No procedures listed *     Physical Assessment  Vital Signs (last filed in past 24 hours):  BP: 161/105 (09/10 1131)  Temp: 36.7 ???C (98.1 ???F) (09/10 1158)  Pulse: 106 (09/10 1131)  Respirations: 18 PER MINUTE (09/10 1158)  Height: 167.6 cm (66) (09/10 1158)  Weight: 178.7 kg (394 lb) (09/10 1158)      Patient History  Allergies   Allergen Reactions   ??? Latex HIVES        Current Medications    Medication Directions   aspirin 325 mg tablet Take 325 mg by mouth daily. Take with food.   cyclobenzaprine (FLEXERIL) 10 mg tablet Take 1 tablet by mouth three times daily as needed for Muscle Cramps.   docusate (COLACE) 100 mg capsule Take 200 mg by mouth daily.   hydrOXYzine pamoate (VISTARIL) 50 mg capsule Take 50 mg by mouth every 6 hours as needed for Itching.   insulin aspart U-100 (NOVOLOG FLEXPEN U-100 INSULIN) 100 unit/mL injection PEN Inject under the skin at mealtimes: 25 units with breakfast, 20 units with lunch, 26 units with dinner.  Patient taking differently: Inject under the skin at mealtimes: 26 units with breakfast, 20 units with lunch, 28 units with dinner.   insulin NPH (NOVOLIN N NPH U-100 INSULIN) 100 unit/mL injection Inject under the skin 53 units with breakfast, 29 units at bedtime.  Patient taking differently: Inject under the skin 45 units with breakfast, 25 units at bedtime.   insulin pen needles (disposable) (B-D NANO; ULTICARE MICRO) 32 gauge x 5/32 pen needle use 3 times daily and as needed  as directed   insulin pen needles (disposable) (BD UF NANO PEN NEEDLES) 32 gauge x 5/32 pen needle Use 1 each as directed as Needed. Use with insulin injections.   Insulin Syringe-Needle U-100 0.5 mL 31 gauge x 5/16 syrg Use as directed to inject insulin.   labetalol (NORMODYNE) 300 mg tablet Take two tablets by mouth three times daily   metFORMIN (GLUCOPHAGE) 1,000 mg tablet Take 1 tablet by mouth twice daily with meals.   PRENATAL VIT CALC,IRON,FOLIC (PRENATAL VITAMIN PO) Take  by mouth.         Review of Systems/Medical History      Patient summary reviewed  Nursing notes reviewed  Pertinent labs reviewed    PONV Screening: Female gender and Non-smoker  No history of anesthetic complications  No family history of anesthetic complications      Airway - negative        Pulmonary           No indications/hx of asthma      No recent URI      Sleep apnea (suspected, not diagnosed)      Cardiovascular         Exercise tolerance: <4 METS      Beta Blocker therapy: No        Hypertension, poorly controlled      No palpitations      Dyspnea on exertion      No syncope      Echo 06/23/16  ???Chamber sizes are normal  ???Mild concentric left ventricular hypertrophy  ???Right ventricular function is normal  ???Normal  left ventricular systolic function, with an ejection fraction estimated at 60%  ???Normal diastolic function  ???No hemodynamically significant valvular abnormalities   ???Pulmonary artery pressure is estimated at 39 mmHg      GI/Hepatic/Renal           GERD, well controlled      No nausea      Neuro/Psych         Headaches (PTA)      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    Abdominal Findings:       Obese Neurological Findings: Negative      Normal mental status       Diagnostic Tests  Hematology:   Lab Results   Component Value Date    HGB 10.0 09/19/2016    HCT 30.1 09/19/2016    PLTCT 233 09/19/2016    WBC 8.7 09/19/2016    NEUT 64 09/08/2016    ANC 3.50 09/08/2016    ALC 1.70 09/08/2016    MONA 5 09/08/2016    AMC 0.30 09/08/2016    EOSA 0 09/08/2016    ABC 0.00 09/08/2016    MCV 93.7 09/19/2016    MCH 31.1 09/19/2016    MCHC 33.2 09/19/2016    MPV 8.0 09/19/2016    RDW 14.3 09/19/2016         General Chemistry:   Lab Results   Component Value Date    NA 135 09/19/2016    K 4.4 09/19/2016    CL 104 09/19/2016    CO2 21 09/19/2016    GAP 10 09/19/2016    BUN 12 09/19/2016    CR 0.78 09/19/2016    GLU 68 09/19/2016    CA 9.1 09/19/2016    ALBUMIN 3.5 09/19/2016    TOTBILI 0.4 09/19/2016      Coagulation: No results found for: PT, PTT, INR      Anesthesia Plan    ASA score: 3         Informed Consent  Anesthetic plan and risks discussed with patient.  Use of blood products discussed with patient  Blood Consent: consented      Plan discussed with: resident and anesthesiologist.  Comments: (Discussed and consented for neuraxial block with backup general anesthesia. Questions answered.)

## 2016-09-29 NOTE — Anesthesia Procedure Notes
Anesthesia Procedure: Combined Spinal/Epidural Block    CSE BLOCK  Date/Time: 09/29/2016 6:06 PM    Patient Location: OB/OR  Reason for block: primary anesthetic      Preprocedure checklist performed: 2 patient identifiers, risks & benefits discussed, patient evaluated, timeout performed, consent obtained, patient being monitored, existing labs reviewed, no anticoagulant within risk period and sterile drape    Sterile technique:  - Proper hand washing  - Cap, mask  - Sterile gloves  - Skin prep for antisepsis    CSE Procedure  Patient position: sitting  Prep: ChloraPrep and patient draped    Monitoring: BP and continuous pulse ox  Approach: midline  Procedures: landmark technique      Local infiltration: 1% lidocaine injected locally  Number of attempts: 1    Spinal needle and catheter:      Needle type: pencil-tip      Needle gauge: 25 G; Needle length: 5 in     Location: L3-4  Epidural needle:      Injection technique: LOR saline     Needle type: Tuohy     Needle gauge: 18 G; Needle length: 3.5 in     Needle Insertion Depth: 7 cm     Level/Interspace: L3-4  Epidural catheter:      Catheter type: multi-orifice     Catheter size: 20 G      Catheter at skin depth: 12 cm    Procedure Outcome  Sensory level: T4  Events: cerebrospinal fluid aspirated, no paresthesia and negative aspiration test  Observations: adequate block and patient tolerated the procedure well with no immediate complications        Performed by: Malak Orantes  Authorized by: Fortino SicSTAPLES, BRYANT J

## 2016-09-29 NOTE — Anesthesia Pre-Procedure Evaluation
Anesthesia Pre-Procedure Evaluation    Name: Tonya Ortiz      MRN: [id number]     DOB: September 11, 1980     Age: 36 y.o.     Sex: female   __________________________________________________________________________     Procedure Date: 09/29/2016   Procedure: Procedure(s):  CESAREAN SECTION     Physical Assessment  Vital Signs (last filed in past 24 hours):  BP: 153/93 (09/10 1716)  Temp: 36.7 ???C (98.1 ???F) (09/10 1158)  Pulse: 105 (09/10 1716)  Respirations: 22 PER MINUTE (09/10 1546)  SpO2: 98 % (09/10 1715)  Height: 167.6 cm (66) (09/10 1158)  Weight: 178.7 kg (394 lb) (09/10 1158)      Patient History  Allergies   Allergen Reactions   ??? Latex HIVES        Current Medications    Medication Directions   aspirin 325 mg tablet Take 325 mg by mouth daily. Take with food.   cyclobenzaprine (FLEXERIL) 10 mg tablet Take 1 tablet by mouth three times daily as needed for Muscle Cramps.   docusate (COLACE) 100 mg capsule Take 200 mg by mouth daily.   hydrOXYzine pamoate (VISTARIL) 50 mg capsule Take 50 mg by mouth every 6 hours as needed for Itching.   insulin aspart U-100 (NOVOLOG FLEXPEN U-100 INSULIN) 100 unit/mL injection PEN Inject under the skin at mealtimes: 25 units with breakfast, 20 units with lunch, 26 units with dinner.  Patient taking differently: Inject under the skin at mealtimes: 26 units with breakfast, 20 units with lunch, 28 units with dinner.   insulin NPH (NOVOLIN N NPH U-100 INSULIN) 100 unit/mL injection Inject under the skin 53 units with breakfast, 29 units at bedtime.  Patient taking differently: Inject under the skin 45 units with breakfast, 25 units at bedtime.   insulin pen needles (disposable) (B-D NANO; ULTICARE MICRO) 32 gauge x 5/32 pen needle use 3 times daily and as needed  as directed   insulin pen needles (disposable) (BD UF NANO PEN NEEDLES) 32 gauge x 5/32 pen needle Use 1 each as directed as Needed. Use with insulin injections. Insulin Syringe-Needle U-100 0.5 mL 31 gauge x 5/16 syrg Use as directed to inject insulin.   labetalol (NORMODYNE) 300 mg tablet Take two tablets by mouth three times daily   metFORMIN (GLUCOPHAGE) 1,000 mg tablet Take 1 tablet by mouth twice daily with meals.   PRENATAL VIT CALC,IRON,FOLIC (PRENATAL VITAMIN PO) Take  by mouth.         Review of Systems/Medical History      Patient summary reviewed  Nursing notes reviewed  Pertinent labs reviewed    PONV Screening: Female gender and Non-smoker  No history of anesthetic complications  No family history of anesthetic complications      Airway - negative        Pulmonary           No indications/hx of asthma      No recent URI      Sleep apnea (suspected, not diagnosed)      Cardiovascular         Exercise tolerance: <4 METS      Beta Blocker therapy: No        Hypertension, poorly controlled      No palpitations      Dyspnea on exertion      No syncope      Echo 06/23/16  ???Chamber sizes are normal  ???Mild concentric left ventricular hypertrophy  ???Right ventricular function is  normal  ???Normal left ventricular systolic function, with an ejection fraction estimated at 60%  ???Normal diastolic function  ???No hemodynamically significant valvular abnormalities   ???Pulmonary artery pressure is estimated at 39 mmHg      GI/Hepatic/Renal           GERD, well controlled      No nausea      Neuro/Psych         Headaches (PTA)      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    Abdominal Findings:       Obese    Neurological Findings: Negative      Normal mental status       Diagnostic Tests  Hematology:   Lab Results   Component Value Date    HGB 9.9 09/29/2016    HCT 30.1 09/29/2016    PLTCT 328 09/29/2016    WBC 12.1 09/29/2016    NEUT 64 09/08/2016    ANC 3.50 09/08/2016    ALC 1.70 09/08/2016    MONA 5 09/08/2016    AMC 0.30 09/08/2016    EOSA 0 09/08/2016    ABC 0.00 09/08/2016    MCV 91.4 09/29/2016    MCH 30.2 09/29/2016    MCHC 33.0 09/29/2016    MPV 7.4 09/29/2016    RDW 13.6 09/29/2016         General Chemistry:   Lab Results   Component Value Date    NA 135 09/29/2016    K 4.8 09/29/2016    CL 101 09/29/2016    CO2 25 09/29/2016    GAP 9 09/29/2016    BUN 13 09/29/2016    CR 0.75 09/29/2016    GLU 96 09/29/2016    CA 9.1 09/29/2016    ALBUMIN 3.2 09/29/2016    TOTBILI 0.5 09/29/2016      Coagulation:   Lab Results   Component Value Date    PTT 25.1 09/29/2016         Anesthesia Plan    ASA score: 3         Informed Consent  Anesthetic plan and risks discussed with patient.  Use of blood products discussed with patient  Blood Consent: consented      Plan discussed with: resident and anesthesiologist.  Comments: (Discussed and consented for neuraxial block with backup general anesthesia. Questions answered.)

## 2016-09-29 NOTE — Progress Notes
R2 update note    To bedside for SVE  FHR 150-155, mod var  CTX: 5/10  Patient complains of abdominal pain which she feels is in her right side and is separate from her contractions. No apparent fetal tachycardia however in the setting of frequent contractions and abdominal pain which the patient feels is different from her contractions. D/w Dr. [facility]aren will send abruption labs.   SVE 1.5/3/-3  BPP in clinic today with cephalic position    Harriet PhoKaren Estrada-Stephen M.D.  Obstetrics and Gynecology, PGY-2  Discussed with Hope Pigeon[facility]Laren

## 2016-09-29 NOTE — H&P (View-Only)
Labor/Delivery   Admission History and Physical Examination      Tonya Ortiz  Admission Date: 09/29/2016                     Assessment: 36 y.o. G7P0060 @ [redacted]w[redacted]d here delivery in the setting of cHTN with worsening blood pressures concerning for preeclampsia. Fetus in transverese lie. Patient poor candidate for ECV due to body habitus.   cHTN   DM2 on insulin   Fetus w/ CPAM   BMI 65   Rh pos, RI   GBS pos     Plan:  -Admit, consent  -Anesthesia to see - bolus 1L IVF for spinal anesthesia  -Admit labs   -HELLP labs   -PTA Labetalol 300 mg TID  -Insulin gtt   -CEFM/TOCO until procedure  -To OR for cesarean section  -She is aware of the risks of cesarean section, including bleeding, harm to mom/baby, injury to the bowel/bladder/ureter/vessels and nerves which may need to be repaired intraoperatively or postoperatively. Other risks including DVT/PE, wound separation/cellulitis/evisceration, pulmonary/cardiac complications, maternal or fetal death were also discussed.??? She is also aware of the potential need for blood transfusion and risks associated with blood, including HIV, Hepatitis.  -BCM: delcines   -Fetal CPAM: NICU at delivery       D/w Dr. Beckie Busing, MD  PGY-3 Obstetrics and Gynecology  __________________________________________________________________________________  Chief Complaint:  Blood pressures     History of Present Illness: [name] is a 36 y.o. G7P0060 @ [redacted]w[redacted]d by L=12, Estimated Date of Delivery: 10/20/16.  Pt presents from clinic for     Review of Systems:  A comprehensive review of systems was performed and was negative, except as in HPI    Prenatal Care:  [facility]    OB History:   OB History   Gravida Para Term Preterm AB Living   7       6     SAB TAB Ectopic Multiple Live Births   6              # Outcome Date GA Lbr Len/2nd Weight Sex Delivery Anes PTL Lv   7 Current            6 SAB            5 SAB            4 SAB            3 SAB            2 SAB 1 SAB                               Gyn History:    Pap:  Positive for LSIL   STD:  Negative     PmHx:   Past Medical History:   Diagnosis Date   ??? Diabetes (HCC) 09/07/2014   ??? Hypertension    ??? Obesity 09/07/2014       PsHx:   Denies     FmHx:   Family History   Problem Relation Age of Onset   ??? Hypertension Mother    ??? Hypertension Father    ??? High Cholesterol Father    ??? Hypertension Paternal Grandmother    ??? Diabetes Paternal Grandmother        Social:   Denie stobacco, EtOH, drug use    Allergies:  Latex  Medications:  No current facility-administered medications on file prior to encounter.      Current Outpatient Prescriptions on File Prior to Encounter   Medication Sig Dispense Refill   ??? aspirin 325 mg tablet Take 325 mg by mouth daily. Take with food.     ??? cyclobenzaprine (FLEXERIL) 10 mg tablet Take 1 tablet by mouth three times daily as needed for Muscle Cramps. 30 tablet 2   ??? docusate (COLACE) 100 mg capsule Take 200 mg by mouth daily.     ??? insulin aspart U-100 (NOVOLOG FLEXPEN U-100 INSULIN) 100 unit/mL injection PEN Inject under the skin at mealtimes: 25 units with breakfast, 20 units with lunch, 26 units with dinner. (Patient taking differently: Inject under the skin at mealtimes: 26 units with breakfast, 20 units with lunch, 28 units with dinner.) 45 mL 6   ??? insulin NPH (NOVOLIN N NPH U-100 INSULIN) 100 unit/mL injection Inject under the skin 53 units with breakfast, 29 units at bedtime. (Patient taking differently: Inject under the skin 45 units with breakfast, 25 units at bedtime.) 200 mL 11   ??? insulin pen needles (disposable) (B-D NANO; ULTICARE MICRO) 32 gauge x 5/32 pen needle use 3 times daily and as needed  as directed 100 each 3   ??? insulin pen needles (disposable) (BD UF NANO PEN NEEDLES) 32 gauge x 5/32 pen needle Use 1 each as directed as Needed. Use with insulin injections. 300 each 3   ??? Insulin Syringe-Needle U-100 0.5 mL 31 gauge x 5/16 syrg Use as directed to inject insulin. 100 each 5   ??? labetalol (NORMODYNE) 300 mg tablet Take two tablets by mouth three times daily 180 tablet 3   ??? metFORMIN (GLUCOPHAGE) 1,000 mg tablet Take 1 tablet by mouth twice daily with meals. 180 tablet 1   ??? PRENATAL VIT CALC,IRON,FOLIC (PRENATAL VITAMIN PO) Take  by mouth.           Physical Exam:  Vital Signs: Last Filed In 24 Hours                Vital Signs: 24 Hour Range   BP: 148/96 (09/10 0918)  Pulse: 118 (09/10 0918)  Height: 165.1 cm (65) (09/10 0918) BP: (148)/(96)   Pulse:  [118]    Intensity Pain Scale (Self Report): (not recorded)      Gen: NAD  CV: Regular rate  Chest: Unlabored breathing  Abd: Gravid, nttp, obsese, Leopold's limited EFW 3100g   Ext: No LE edema, nttp LE    SSE: deferred   TOCO:  0/10  FHR:  145, mod var, reactive     Lab/Radiology/Other Diagnostic Tests:  A POS, Ab-, PapLSIL, RI, HIV-, HBsAg-, Syph AB-, GC/CT -/-, GBS+    24-hour labs:  No results found for this visit on 09/29/16 (from the past 24 hour(s)).    Point of Care Testing:

## 2016-09-29 NOTE — Progress Notes
1315: Primary nurse C Saager, RN notified of pt's 15 minute post IV labetalol BP.

## 2016-09-29 NOTE — Progress Notes
R2 Update    To bedside with Dr. Hope PigeonMcLaren  SVE 1.5/2/-3  CTX 4-5/10  FHR tracing reviewed baseline 150-155, moderate variability, some contractions with shallow decelerations late in timing with recovery to baseline. Patient with no induction agents. Shared with patient concern that if she were to undergo induction of labor, fetus may not tolerate labor. Risks and benefits of cesarean section were discussed with patient. Patient elects for cesarean section.  Discussed with Dr. Arville CareParks and Dr. Flossie DibbleGibbs  Daelon Dunivan Estrada-Stephen M.D.  Obstetrics and Gynecology, PGY-2  Please page Low Risk Obstetrics Pager at [phone]

## 2016-09-29 NOTE — Progress Notes
Transportation called to take pt to L&D

## 2016-09-30 ENCOUNTER — Encounter: Admit: 2016-09-30 | Discharge: 2016-09-30 | Primary: Adult Health

## 2016-09-30 DIAGNOSIS — E669 Obesity, unspecified: ICD-10-CM

## 2016-09-30 DIAGNOSIS — I10 Essential (primary) hypertension: ICD-10-CM

## 2016-09-30 DIAGNOSIS — E119 Type 2 diabetes mellitus without complications: Principal | ICD-10-CM

## 2016-09-30 LAB — CBC
Lab: 13 % (ref 11–15)
Lab: 2.6 M/UL — ABNORMAL LOW (ref 4.0–5.0)
Lab: 24 % — ABNORMAL LOW (ref 36–45)
Lab: 252 10*3/uL (ref 150–400)
Lab: 30 pg (ref 26–34)
Lab: 33 g/dL (ref 32.0–36.0)
Lab: 7 FL (ref 7–11)
Lab: 8.1 g/dL — ABNORMAL LOW (ref 12.0–15.0)
Lab: 8.8 K/UL (ref 4.5–11.0)
Lab: 92 FL (ref 80–100)

## 2016-09-30 LAB — BLOOD GASES-CORD BLOOD
Lab: 0.3 MMOL/L (ref 26–34)
Lab: 26 mmHg (ref 80–100)
Lab: 54 % (ref 32.0–36.0)
Lab: 7.1 M/UL — ABNORMAL LOW (ref 4.0–5.0)
Lab: 7.3 mmHg — ABNORMAL LOW (ref 12.0–15.0)

## 2016-09-30 LAB — POC GLUCOSE
Lab: 104 mg/dL — ABNORMAL HIGH (ref 70–100)
Lab: 107 mg/dL — ABNORMAL HIGH (ref 70–100)
Lab: 112 mg/dL — ABNORMAL HIGH (ref 70–100)
Lab: 114 mg/dL — ABNORMAL HIGH (ref 70–100)

## 2016-09-30 LAB — CULTURE-URINE W/SENSITIVITY: Lab: <10

## 2016-09-30 LAB — KLEIHAUER-BETKE FETAL HGB ST

## 2016-09-30 LAB — SYPHILIS AB SCREEN: Lab: NEGATIVE mmHg — ABNORMAL LOW (ref 36–45)

## 2016-09-30 MED ORDER — IBUPROFEN 600 MG PO TAB
600 mg | ORAL | 0 refills | Status: DC
Start: 2016-09-30 — End: 2016-10-02
  Administered 2016-09-30 – 2016-10-02 (×10): 600 mg via ORAL

## 2016-09-30 MED ORDER — LANOLIN TP CREA
TOPICAL | 0 refills | Status: DC | PRN
Start: 2016-09-30 — End: 2016-10-02
  Administered 2016-10-01: 14:00:00 via TOPICAL

## 2016-09-30 MED ORDER — SIMETHICONE 80 MG PO CHEW
80 mg | ORAL | 0 refills | Status: DC | PRN
Start: 2016-09-30 — End: 2016-10-02
  Administered 2016-10-01 (×2): 80 mg via ORAL

## 2016-09-30 MED ORDER — FAMOTIDINE 20 MG PO TAB
20 mg | Freq: Two times a day (BID) | ORAL | 0 refills | Status: DC | PRN
Start: 2016-09-30 — End: 2016-10-02
  Administered 2016-10-01: 20:00:00 20 mg via ORAL

## 2016-09-30 MED ORDER — MAGNESIUM HYDROXIDE 2,400 MG/10 ML PO SUSP
10 mL | ORAL | 0 refills | Status: DC | PRN
Start: 2016-09-30 — End: 2016-10-02
  Administered 2016-10-02: 15:00:00 10 mL via ORAL

## 2016-09-30 MED ORDER — DIPHTH,PERTUS(ACELL),TETANUS 2.5-8-5 LF-MCG-LF/0.5ML IM SUSP
.5 mL | Freq: Once | INTRAMUSCULAR | 0 refills | Status: DC
Start: 2016-09-30 — End: 2016-09-30

## 2016-09-30 MED ORDER — ONDANSETRON HCL (PF) 4 MG/2 ML IJ SOLN
4 mg | INTRAVENOUS | 0 refills | Status: DC | PRN
Start: 2016-09-30 — End: 2016-10-02
  Administered 2016-10-02: 02:00:00 4 mg via INTRAVENOUS

## 2016-09-30 MED ORDER — PRENATAL VIT-IRON FUM-FOLIC AC 28 MG IRON- 800 MCG PO TAB
1 | Freq: Every evening | ORAL | 0 refills | Status: DC
Start: 2016-09-30 — End: 2016-10-02
  Administered 2016-10-01 – 2016-10-02 (×2): 1 via ORAL

## 2016-09-30 MED ORDER — DOCUSATE SODIUM 100 MG PO CAP
100 mg | Freq: Two times a day (BID) | ORAL | 0 refills | Status: DC
Start: 2016-09-30 — End: 2016-10-02
  Administered 2016-09-30 – 2016-10-02 (×4): 100 mg via ORAL

## 2016-09-30 MED ORDER — ENOXAPARIN 60 MG/0.6 ML SC SYRG
60 mg | Freq: Two times a day (BID) | SUBCUTANEOUS | 0 refills | Status: DC
Start: 2016-09-30 — End: 2016-10-02
  Administered 2016-10-01 – 2016-10-02 (×4): 60 mg via SUBCUTANEOUS

## 2016-09-30 MED ORDER — NALOXONE 0.4 MG/ML IJ SOLN
.08 mg | INTRAVENOUS | 0 refills | Status: DC | PRN
Start: 2016-09-30 — End: 2016-09-30

## 2016-09-30 MED ORDER — LABETALOL 200 MG PO TAB
400 mg | Freq: Three times a day (TID) | ORAL | 0 refills | Status: DC
Start: 2016-09-30 — End: 2016-10-01
  Administered 2016-10-01: 02:00:00 400 mg via ORAL

## 2016-09-30 NOTE — Med Student Progress Note
OB Antepartum Magnesium Note    Subjective:    36 y.o. [id number] POD#1 PLTCS with superimposed preE.     Patient is without complaint.  Denies chest pain, shortness of breath, headache, blurry vision. Patient reports significant amount of pain around the incision site but states it is localized. Patient denies any other abdominal pain.     Objective:    Physical Exam:    Vitals:  Vitals:    09/30/16 0030 09/30/16 0041 09/30/16 0125 09/30/16 0207   BP: 115/67  124/72 119/69   Pulse: 98 98 102 97   Temp:   36.4 ???C (97.5 ???F) 36.4 ???C (97.5 ???F)   SpO2: 94% 94% 94% 97%   Weight:       Height:           General:  No acute distress.  Heart:  Regular rate and rhythm.  Lungs: Clear to auscultation bilaterally.  Abdomen:  Soft, gravid, non-tender to palpation. Non-distended.   Extremities: Mild edema BL LE.  DTR +2/4 upper and lower extremities.        Lab Review:  24-hour labs:    Results for orders placed or performed during the hospital encounter of 09/29/16 (from the past 24 hour(s))   POC GLUCOSE    Collection Time: 09/29/16 12:11 PM   Result Value Ref Range    Glucose, POC 112 (H) 70 - 100 MG/DL   PROTEIN/CR RATIO,UR RAN    Collection Time: 09/29/16  1:50 PM   Result Value Ref Range    Protein, Random 64 MG/DL    Creatinine, Random 347 MG/DL    Protein/CR ratio 0.3    TYPE & CROSSMATCH    Collection Time: 09/29/16  2:20 PM   Result Value Ref Range    Units Ordered 0     Crossmatch Expires 10/02/2016     Record Check FOUND     ABO/RH(D) A POS     Antibody Screen NEG     Electronic Crossmatch YES    CBC    Collection Time: 09/29/16  2:20 PM   Result Value Ref Range    White Blood Cells 12.1 (H) 4.5 - 11.0 K/UL    RBC 3.29 (L) 4.0 - 5.0 M/UL    Hemoglobin 9.9 (L) 12.0 - 15.0 GM/DL    Hematocrit 42.5 (L) 36 - 45 %    MCV 91.4 80 - 100 FL    MCH 30.2 26 - 34 PG    MCHC 33.0 32.0 - 36.0 G/DL    RDW 95.6 11 - 15 %    Platelet Count 328 150 - 400 K/UL    MPV 7.4 7 - 11 FL   COMPREHENSIVE METABOLIC PANEL Collection Time: 09/29/16  2:20 PM   Result Value Ref Range    Sodium 135 (L) 137 - 147 MMOL/L    Potassium 4.8 3.5 - 5.1 MMOL/L    Chloride 101 98 - 110 MMOL/L    Glucose 96 70 - 100 MG/DL    Blood Urea Nitrogen 13 7 - 25 MG/DL    Creatinine 3.87 0.4 - 1.00 MG/DL    Calcium 9.1 8.5 - 56.4 MG/DL    Total Protein 7.0 6.0 - 8.0 G/DL    Total Bilirubin 0.5 0.3 - 1.2 MG/DL    Albumin 3.2 (L) 3.5 - 5.0 G/DL    Alk Phosphatase 88 25 - 110 U/L    AST (SGOT) 19 7 - 40 U/L    CO2 25 21 - 30  MMOL/L    ALT (SGPT) 11 7 - 56 U/L    Anion Gap 9 3 - 12    eGFR Non African American >60 >60 mL/min    eGFR African American >60 >60 mL/min   LDH-LACTATE DEHYDROGENASE    Collection Time: 09/29/16  2:20 PM   Result Value Ref Range    Lactate Dehydrogenase 250 (H) 100 - 210 U/L   URIC ACID    Collection Time: 09/29/16  2:20 PM   Result Value Ref Range    Uric Acid 4.8 2.0 - 7.0 MG/DL   KLEIHAUER-BETKE FETAL HGB ST    Collection Time: 09/29/16  2:20 PM   Result Value Ref Range    Fetal Stain NO FETAL CELLS OBSERVED    FIBRINOGEN    Collection Time: 09/29/16  3:00 PM   Result Value Ref Range    Fibrinogen 862 (H) 200 - 400 MG/DL   PTT (APTT)    Collection Time: 09/29/16  3:00 PM   Result Value Ref Range    APTT 25.1 20.0 - 36.0 SEC   POC GLUCOSE    Collection Time: 09/29/16  3:18 PM   Result Value Ref Range    Glucose, POC 108 (H) 70 - 100 MG/DL   POC GLUCOSE    Collection Time: 09/29/16  4:33 PM   Result Value Ref Range    Glucose, POC 101 (H) 70 - 100 MG/DL   POC GLUCOSE    Collection Time: 09/29/16  6:56 PM   Result Value Ref Range    Glucose, POC 107 (H) 70 - 100 MG/DL   BLOOD GASES-CORD BLOOD    Collection Time: 09/29/16  7:09 PM   Result Value Ref Range    pH-Cord Blood 7.18     pCO2-Cord Blood 79 MMHG    pO2-Cord Blood 10 MMHG    Base Deficit-Cord Blood 2.8 MMOL/L    O2 Sat-Cord Blood 7.3 %    Cord Blood Gas Source ARTERIAL LINE    BLOOD GASES-CORD BLOOD    Collection Time: 09/29/16  7:09 PM   Result Value Ref Range pH-Cord Blood 7.30     pCO2-Cord Blood 57 MMHG    pO2-Cord Blood 26 MMHG    Base Deficit-Cord Blood 0.3 MMOL/L    O2 Sat-Cord Blood 54.8 %    Cord Blood Gas Source VENOUS DRAW        UO over last 3 hours: 45 ml, 50 ml, 30 ml      Assessment:    36 y.o. [id number] POD#1 PLTCS with superimposed preE, tolerating Mg well.      Plan:    1.Continue magnesium infusion  2. Continue NPO, strict I/O, IVF, BP checks, preeclamptic labs    D/w Dr. Truitt Leep, MS3

## 2016-09-30 NOTE — Consults
General Consult Note      Admission Date: 09/29/2016                                                LOS: 0 days    Reason for Consult:  Fetal CPAM    Consult type: Opinion    Assessment/Plan    I spoke with Ms [name] on 09/29/16  She has had detailed conversation with Neonatolgist and Peds Surgery and verbalized understanding of the uncertainty of the clinical condition and outcome.   I described the CPAM and how the shifting of the mediastinum was a reason for concern. Namely, the underlying lung and the contralateral lung may be hypoplastic and may result in severe hypoximia and may even be fatal. Other comlications include pneumothorax,pulmonary HT, the neonate may require to be transferred over to [facility]. There is a possibility that the underlying lung is matured enough and may be enough for survival.It may or may not require surgery at a later date.   Mom verbalized understanding,  ______________________________________________________________________    History of Present Illness: Tonya Ortiz is a 36 y.o. female with morbid obesity and PE. She is currently at 37 weeks and has fetal CPAM which is causing mediastinal shift.  Past Medical History:   Diagnosis Date   ??? Diabetes (HCC) 09/07/2014   ??? Hypertension    ??? Obesity 09/07/2014     No past surgical history on file.  Social History     Social History   ??? Marital status: Single     Spouse name: N/A   ??? Number of children: N/A   ??? Years of education: N/A     Social History Main Topics   ??? Smoking status: Former Smoker     Packs/day: 0.05     Years: 0.25     Types: Cigarettes     Quit date: 04/21/2015   ??? Smokeless tobacco: Never Used   ??? Alcohol use No   ??? Drug use: No   ??? Sexual activity: Not on file     Other Topics Concern   ??? Not on file     Social History Narrative   ??? No narrative on file     Family history reviewed; non-contributory  Allergies:  Latex    Scheduled Meds: CARBOPROST TROMETHAMINE 250 MCG/ML IM SOLN (Cabinet Override)   NOW   labetalol (NORMODYNE) tablet 600 mg 600 mg Oral TID   magnesium sulfate   4 g/50 mL IVPB 4 g Intravenous ONCE   miSOPROStol (CYTOTEC) tablet 800 mcg 800 mcg Rectal ONCE   NIFEdipine XL (PROCARDIA-XL) tablet 60 mg 60 mg Oral QHS   SODIUM CHLORIDE 0.9 % IV SOLP (Cabinet Override)   NOW   TRANEXAMIC ACID 1,000 MG/10 ML (100 MG/ML) IV SOLN (Cabinet Override)   NOW   Continuous Infusions:  ??? insulin regular (NOVOLIN R) 100 Units in sodium chloride 0.9% (NS) 100 mL IV drip (std conc) Stopped (09/29/16 1736)   ??? lactated ringers infusion 50 mL/hr at 09/29/16 1450   ??? magnesium sulfate  20 g/500 mL infusion 1 g/hr (09/29/16 1801)   ??? oxytocin (PITOCIN) 30 units/NS 500 mL IVPB Stopped (09/29/16 1204)     PRN and Respiratory Meds:cyclobenzaprine TID PRN    Review of Systems:  Review obtained from Chart.  Vital Signs:  Last Filed in  24 hours Vital Signs:  24 hour Range    BP: 153/94 (09/10 1732)  Temp: 36.7 ???C (98.1 ???F) (09/10 1158)  Pulse: 102 (09/10 1732)  Respirations: 22 PER MINUTE (09/10 1546)  SpO2: 98 % (09/10 1715)  SpO2 Pulse: 104 (09/10 1715)  Height: 167.6 cm (66) (09/10 1158) BP: (132-181)/(70-115)   Temp:  [36.7 ???C (98.1 ???F)]   Pulse:  [102-118]   Respirations:  [18 PER MINUTE-22 PER MINUTE]   SpO2:  [96 %-100 %]      Physical Exam:  NA    Lab/Radiology/Other Diagnostic Tests:  24-hour labs:    Results for orders placed or performed during the hospital encounter of 09/29/16 (from the past 24 hour(s))   POC GLUCOSE    Collection Time: 09/29/16 12:11 PM   Result Value Ref Range    Glucose, POC 112 (H) 70 - 100 MG/DL   PROTEIN/CR RATIO,UR RAN    Collection Time: 09/29/16  1:50 PM   Result Value Ref Range    Protein, Random 64 MG/DL    Creatinine, Random 098 MG/DL    Protein/CR ratio 0.3    TYPE & CROSSMATCH    Collection Time: 09/29/16  2:20 PM   Result Value Ref Range    Units Ordered 0     Crossmatch Expires 10/02/2016     Record Check FOUND ABO/RH(D) A POS     Antibody Screen NEG     Electronic Crossmatch YES    CBC    Collection Time: 09/29/16  2:20 PM   Result Value Ref Range    White Blood Cells 12.1 (H) 4.5 - 11.0 K/UL    RBC 3.29 (L) 4.0 - 5.0 M/UL    Hemoglobin 9.9 (L) 12.0 - 15.0 GM/DL    Hematocrit 11.9 (L) 36 - 45 %    MCV 91.4 80 - 100 FL    MCH 30.2 26 - 34 PG    MCHC 33.0 32.0 - 36.0 G/DL    RDW 14.7 11 - 15 %    Platelet Count 328 150 - 400 K/UL    MPV 7.4 7 - 11 FL   COMPREHENSIVE METABOLIC PANEL    Collection Time: 09/29/16  2:20 PM   Result Value Ref Range    Sodium 135 (L) 137 - 147 MMOL/L    Potassium 4.8 3.5 - 5.1 MMOL/L    Chloride 101 98 - 110 MMOL/L    Glucose 96 70 - 100 MG/DL    Blood Urea Nitrogen 13 7 - 25 MG/DL    Creatinine 8.29 0.4 - 1.00 MG/DL    Calcium 9.1 8.5 - 56.2 MG/DL    Total Protein 7.0 6.0 - 8.0 G/DL    Total Bilirubin 0.5 0.3 - 1.2 MG/DL    Albumin 3.2 (L) 3.5 - 5.0 G/DL    Alk Phosphatase 88 25 - 110 U/L    AST (SGOT) 19 7 - 40 U/L    CO2 25 21 - 30 MMOL/L    ALT (SGPT) 11 7 - 56 U/L    Anion Gap 9 3 - 12    eGFR Non African American >60 >60 mL/min    eGFR African American >60 >60 mL/min   LDH-LACTATE DEHYDROGENASE    Collection Time: 09/29/16  2:20 PM   Result Value Ref Range    Lactate Dehydrogenase 250 (H) 100 - 210 U/L   URIC ACID    Collection Time: 09/29/16  2:20 PM   Result Value Ref Range  Uric Acid 4.8 2.0 - 7.0 MG/DL   KLEIHAUER-BETKE FETAL HGB ST    Collection Time: 09/29/16  2:20 PM   Result Value Ref Range    Fetal Stain NO FETAL CELLS OBSERVED    FIBRINOGEN    Collection Time: 09/29/16  3:00 PM   Result Value Ref Range    Fibrinogen 862 (H) 200 - 400 MG/DL   PTT (APTT)    Collection Time: 09/29/16  3:00 PM   Result Value Ref Range    APTT 25.1 20.0 - 36.0 SEC   POC GLUCOSE    Collection Time: 09/29/16  3:18 PM   Result Value Ref Range    Glucose, POC 108 (H) 70 - 100 MG/DL   POC GLUCOSE    Collection Time: 09/29/16  4:33 PM   Result Value Ref Range    Glucose, POC 101 (H) 70 - 100 MG/DL POC GLUCOSE    Collection Time: 09/29/16  6:56 PM   Result Value Ref Range    Glucose, POC 107 (H) 70 - 100 MG/DL   BLOOD GASES-CORD BLOOD    Collection Time: 09/29/16  7:09 PM   Result Value Ref Range    pH-Cord Blood 7.18     pCO2-Cord Blood 79 MMHG    pO2-Cord Blood 10 MMHG    Base Deficit-Cord Blood 2.8 MMOL/L    O2 Sat-Cord Blood 7.3 %    Cord Blood Gas Source ARTERIAL LINE    BLOOD GASES-CORD BLOOD    Collection Time: 09/29/16  7:09 PM   Result Value Ref Range    pH-Cord Blood 7.30     pCO2-Cord Blood 57 MMHG    pO2-Cord Blood 26 MMHG    Base Deficit-Cord Blood 0.3 MMOL/L    O2 Sat-Cord Blood 54.8 %    Cord Blood Gas Source VENOUS DRAW      Pertinent radiology reviewed.    Suann Larry, MD  Pager [phone]

## 2016-09-30 NOTE — Consults
Endocrinology Consult        Today's Date:  09/30/2016  Admission Date: 09/29/2016  LOS: 1 day      Reason for Consult:  postpartum DMII  Consult type: Co-management w/signed orders      Assessment:  Active Problems:    Hypertension in pregnancy    Morbid obesity (HCC)      Diabetes mellitus: Type 2, Controlled  ? A1c: 4.8% . Goal A1c < 6.5% without hypoglycemia  ? DM provider as outpatient: PCP  ? Complications:   o Retinopathy: No  o Peripheral neuropathy: no  o Autonomic neuropathy: no  o Nephropathy: no  o Macrovascular complications: no  o Chronic wound: no  o Amputations: no  ? Prior to pregnancy regimen: Metformin 1000 mg BID  ? Other DM medications:  o Statin: No  Last lipid profile - Lab Results   Component Value Date    CHOL 187 08/08/2014    TRIG 261 (H) 08/08/2014    HDL 47 08/08/2014    LDL 88 08/08/2014    VLDL 52 (H) 08/08/2014   o   o ACEi/ARB: No  o On ASA? (over age 39): yes    Interpretation and recommendations for today:  ? Pt is now postpartum, no longer requiring insulin. Received 1000 mg Metformin this am and tolerating well. Has not eaten today, is on a diabetic clear liquid diet.  ? Continue Metformin 1000 mg BID  ? If BG become more elevated, > 140, would start low dose correction factor Novolog insulin.  ? Discussed need for weight loss, encouraged breast feeding  ? Would be a candidate for a GLP-1, after breast feeding is complete, to help aid in weight loss      Patient seen and discussed with Dr. Barbara Cower      ______________________________________________________________________    History of Present Illness: Tonya Ortiz is a 36 y.o. female her for delivery. Had Type 2 Diabetes prior to pregnancy, was treated by PCP and well controlled with Metformin 1000 mg BID. Was on insulin during pregnancy and an insulin gtt during delivery. She's no longer requiring insulin and continues on Metformin.      Past Medical History:   Diagnosis Date   ??? Diabetes (HCC) 09/07/2014 ??? Hypertension    ??? Obesity 09/07/2014     No past surgical history on file.  Social History     Social History   ??? Marital status: Single     Spouse name: N/A   ??? Number of children: N/A   ??? Years of education: N/A     Social History Main Topics   ??? Smoking status: Former Smoker     Packs/day: 0.05     Years: 0.25     Types: Cigarettes     Quit date: 04/21/2015   ??? Smokeless tobacco: Never Used   ??? Alcohol use No   ??? Drug use: No   ??? Sexual activity: Not on file     Other Topics Concern   ??? Not on file     Social History Narrative   ??? No narrative on file     Family History   Problem Relation Age of Onset   ??? Hypertension Mother    ??? Hypertension Father    ??? High Cholesterol Father    ??? Hypertension Paternal Grandmother    ??? Diabetes Paternal Grandmother        Allergies:  Latex    Scheduled Meds:  acetaminophen (TYLENOL) tablet 650  mg 650 mg Oral Q6H*   diphtheria/tetanus/pertus(acell) booster (Tdap) (BOOSTRIX) injection 0.5 mL 0.5 mL Intramuscular ONCE   docusate (COLACE) capsule 100 mg 100 mg Oral BID   ibuprofen (MOTRIN) tablet 600 mg 600 mg Oral Q6H   labetalol (NORMODYNE) tablet 600 mg 600 mg Oral TID   metFORMIN (GLUCOPHAGE) tablet 1,000 mg 1,000 mg Oral BID w/meals   NIFEdipine XL (PROCARDIA-XL) tablet 60 mg 60 mg Oral QHS   vitamins, prenatal w/iron & folate tablet 1 tablet 1 tablet Oral QHS   Continuous Infusions:  ??? lactated ringers infusion 125 mL/hr at 09/30/16 0544   ??? magnesium sulfate  20 g/500 mL infusion 1 g/hr (09/30/16 0749)   ??? morphine PCA   55 mg/NS 55ml infusion syr (std conc)(premade)       PRN and Respiratory Meds:cyclobenzaprine TID PRN, famotidine BID PRN, lanolin PRN, milk of magnesia (CONC) Q6H PRN, naloxone PRN, ondansetron (ZOFRAN) IV Q4H PRN, oxyCODONE Q4H PRN, simethicone Q6H PRN      Review of Systems:  Denies: BOB, Chest Pain, Nausea, Vomiting    Vital Signs:  Last Filed in 24 hours Vital Signs:  24 hour Range    BP: 129/72 (09/11 0828)  Temp: 36.7 ???C (98 ???F) (09/11 1610) Pulse: 98 (09/11 0828)  Respirations: 20 PER MINUTE (09/11 0828)  SpO2: 97 % (09/11 0828)  O2 Delivery: None (Room Air) (09/11 0828)  SpO2 Pulse: 102 (09/11 0000)  Height: 167.6 cm (66) (09/10 1158) BP: (115-181)/(67-115)   Temp:  [36.4 ???C (97.5 ???F)-37 ???C (98.6 ???F)]   Pulse:  [97-112]   Respirations:  [18 PER MINUTE-28 PER MINUTE]   SpO2:  [92 %-100 %]   O2 Delivery: None (Room Air)     Physical Exam:    General:  Alert, cooperative, no distress, appears stated age  Head:  Normocephalic, without obvious abnormality, atraumatic    ENT: Moist mucous membranes  Lungs:  Clear to auscultation bilaterally  Heart:    Regular rate and rhythm  Abdomen:  Soft, non-tender, obese, hypoactive bowel sounds.   Extremities:  Extremities normal, atraumatic  Skin: No rash or lesions noted        Lab:    Recent Labs      09/29/16   1420   NA  135*   K  4.8   CL  101   CO2  25   GAP  9   BUN  13   CR  0.75   GLU  96   CA  9.1   ALBUMIN  3.2*       Recent Labs      09/29/16   1420  09/29/16   1500  09/30/16   0705   WBC  12.1*   --   8.8   HGB  9.9*   --   8.1*   HCT  30.1*   --   24.2*   PLTCT  328   --   252   PTT   --   25.1   --    AST  19   --    --    ALT  11   --    --    ALKPHOS  88   --    --       Estimated Creatinine Clearance: 178.7 mL/min (based on SCr of 0.75 mg/dL).  Vitals:    09/29/16 1158   Weight: (!) 178.7 kg (394 lb)        Glucose, POC  Date/Time Value Ref Range Status   09/30/2016 0911 104 (H) 70 - 100 MG/DL Final   16/10/9602 5409 107 (H) 70 - 100 MG/DL Final   81/19/1478 2956 101 (H) 70 - 100 MG/DL Final   21/30/8657 8469 108 (H) 70 - 100 MG/DL Final   62/95/2841 3244 112 (H) 70 - 100 MG/DL Final   01/22/7251 6644 95 70 - 100 MG/DL Final   03/47/4259 5638 96 70 - 100 MG/DL Final   75/64/3329 5188 118 (H) 70 - 100 MG/DL Final       Radiology and other Diagnostics Review:   No pertinent radiology.      Thank you for the opportunity to participate in this pt's care  Please page with any questions or concerns Darra Lis MSN, FNP-BC, CDE  Endocrine Nurse Practitioner  Pager [phone]  Office: [phone]

## 2016-09-30 NOTE — Anesthesia Post-Procedure Evaluation
Post-Anesthesia Evaluation    Name: Tonya Ortiz      MRN: [id number]     DOB: 26-Dec-1980     Age: 36 y.o.     Sex: female   __________________________________________________________________________     Procedure Date: 09/29/2016  Procedure: Procedure(s):  CESAREAN SECTION      Surgeon: Surgeon(s):  Irven BaltimoreEstrada-Stephen, Karen E, MD  Myrtie HawkFrench, Valerie A, MD  Rosalio LoudMcLaren, Hillary, MD    Post-Anesthesia Vitals  BP: 119/14144/73 [phone](09/10 2230)  Pulse: 103 (09/10 2230)  Respirations: 26 PER MINUTE (09/10 2230)  SpO2: 95 % (09/10 2230)  SpO2 Pulse: 103 (09/10 2230)      Post Anesthesia Evaluation Note    Evaluation location: Pre/Post  Patient participation: recovered; patient participated in evaluation  Level of consciousness: alert    Pain score: 0  Pain management: adequate    Hydration: normovolemia  Temperature: 36.0C - 38.4C  Airway patency: adequate    Perioperative Events  Perioperative events:  no       Post-op nausea and vomiting: no PONV    Postoperative Status  Cardiovascular status: hemodynamically stable  Respiratory status: spontaneous ventilation  Follow-up needed: none        Perioperative Events  Perioperative Event: Yes  Emergency Case Activation: Yes  Procedure: vascular access event

## 2016-09-30 NOTE — Med Student Progress Note
OB Antepartum Magnesium Note    Subjective:    Patient is without complaint.  Denies chest pain, shortness of breath, headache, blurry vision, abdominal pain.        Objective:    Physical Exam:    Vitals:  Vitals:    09/29/16 2200 09/29/16 2215 09/29/16 2230 09/29/16 2300   BP: 145/88 138/83 144/73    Pulse: 103 103 103    Temp:    37 ???C (98.6 ???F)   SpO2: 100% 94% 95%    Weight:       Height:           General:  No acute distress.  Heart:  Regular rate and rhythm.  Lungs: Clear to auscultation bilaterally.  Abdomen:  Soft, gravid, appropriately to palpation. Non-distended.   Extremities: Mild edema BL LE.  DTR +2/4 upper and lower extremities.      Lab Review:  24-hour labs:    Results for orders placed or performed during the hospital encounter of 09/29/16 (from the past 24 hour(s))   POC GLUCOSE    Collection Time: 09/29/16 12:11 PM   Result Value Ref Range    Glucose, POC 112 (H) 70 - 100 MG/DL   PROTEIN/CR RATIO,UR RAN    Collection Time: 09/29/16  1:50 PM   Result Value Ref Range    Protein, Random 64 MG/DL    Creatinine, Random 191 MG/DL    Protein/CR ratio 0.3    TYPE & CROSSMATCH    Collection Time: 09/29/16  2:20 PM   Result Value Ref Range    Units Ordered 0     Crossmatch Expires 10/02/2016     Record Check FOUND     ABO/RH(D) A POS     Antibody Screen NEG     Electronic Crossmatch YES    CBC    Collection Time: 09/29/16  2:20 PM   Result Value Ref Range    White Blood Cells 12.1 (H) 4.5 - 11.0 K/UL    RBC 3.29 (L) 4.0 - 5.0 M/UL    Hemoglobin 9.9 (L) 12.0 - 15.0 GM/DL    Hematocrit 47.8 (L) 36 - 45 %    MCV 91.4 80 - 100 FL    MCH 30.2 26 - 34 PG    MCHC 33.0 32.0 - 36.0 G/DL    RDW 29.5 11 - 15 %    Platelet Count 328 150 - 400 K/UL    MPV 7.4 7 - 11 FL   COMPREHENSIVE METABOLIC PANEL    Collection Time: 09/29/16  2:20 PM   Result Value Ref Range    Sodium 135 (L) 137 - 147 MMOL/L    Potassium 4.8 3.5 - 5.1 MMOL/L    Chloride 101 98 - 110 MMOL/L    Glucose 96 70 - 100 MG/DL Blood Urea Nitrogen 13 7 - 25 MG/DL    Creatinine 6.21 0.4 - 1.00 MG/DL    Calcium 9.1 8.5 - 30.8 MG/DL    Total Protein 7.0 6.0 - 8.0 G/DL    Total Bilirubin 0.5 0.3 - 1.2 MG/DL    Albumin 3.2 (L) 3.5 - 5.0 G/DL    Alk Phosphatase 88 25 - 110 U/L    AST (SGOT) 19 7 - 40 U/L    CO2 25 21 - 30 MMOL/L    ALT (SGPT) 11 7 - 56 U/L    Anion Gap 9 3 - 12    eGFR Non African American >60 >60 mL/min  eGFR African American >60 >60 mL/min   LDH-LACTATE DEHYDROGENASE    Collection Time: 09/29/16  2:20 PM   Result Value Ref Range    Lactate Dehydrogenase 250 (H) 100 - 210 U/L   URIC ACID    Collection Time: 09/29/16  2:20 PM   Result Value Ref Range    Uric Acid 4.8 2.0 - 7.0 MG/DL   KLEIHAUER-BETKE FETAL HGB ST    Collection Time: 09/29/16  2:20 PM   Result Value Ref Range    Fetal Stain NO FETAL CELLS OBSERVED    FIBRINOGEN    Collection Time: 09/29/16  3:00 PM   Result Value Ref Range    Fibrinogen 862 (H) 200 - 400 MG/DL   PTT (APTT)    Collection Time: 09/29/16  3:00 PM   Result Value Ref Range    APTT 25.1 20.0 - 36.0 SEC   POC GLUCOSE    Collection Time: 09/29/16  3:18 PM   Result Value Ref Range    Glucose, POC 108 (H) 70 - 100 MG/DL   POC GLUCOSE    Collection Time: 09/29/16  4:33 PM   Result Value Ref Range    Glucose, POC 101 (H) 70 - 100 MG/DL   POC GLUCOSE    Collection Time: 09/29/16  6:56 PM   Result Value Ref Range    Glucose, POC 107 (H) 70 - 100 MG/DL   BLOOD GASES-CORD BLOOD    Collection Time: 09/29/16  7:09 PM   Result Value Ref Range    pH-Cord Blood 7.18     pCO2-Cord Blood 79 MMHG    pO2-Cord Blood 10 MMHG    Base Deficit-Cord Blood 2.8 MMOL/L    O2 Sat-Cord Blood 7.3 %    Cord Blood Gas Source ARTERIAL LINE    BLOOD GASES-CORD BLOOD    Collection Time: 09/29/16  7:09 PM   Result Value Ref Range    pH-Cord Blood 7.30     pCO2-Cord Blood 57 MMHG    pO2-Cord Blood 26 MMHG    Base Deficit-Cord Blood 0.3 MMOL/L    O2 Sat-Cord Blood 54.8 %    Cord Blood Gas Source VENOUS DRAW UO over last 3 hours: 140 ml, 120 ml, 0 ml      Assessment:    36 y.o. [id number] POD#1 PLTCS with superimposed preE, tolerating Mg well.      Plan:    1.Continue magnesium infusion  2. Continue CLD, strict I/O, IVF, BP checks, preeclamptic labs     D/w Dr. Elenora Fender, MS3

## 2016-09-30 NOTE — Progress Notes
OB NOTE POD#1      Subjective:    NAE overnight. Sleeping in bed.   Objective:    Vitals:   Vitals:    09/30/16 0030 09/30/16 0041 09/30/16 0125 09/30/16 0207   BP: 115/67  124/72 119/69   Pulse: 98 98 102 97   Temp:   36.4 ???C (97.5 ???F) 36.4 ???C (97.5 ???F)   SpO2: 94% 94% 94% 97%   Weight:       Height:         Gen: NAD  Chest: unlabored  Abd: ND, appropriately ttp, firm fundus at umbilicus  Incision: dressing in place, no shadowing   Ext: Nttp, no edema in lower ext bilaterally    24-hour labs:    Results for orders placed or performed during the hospital encounter of 09/29/16 (from the past 24 hour(s))   POC GLUCOSE    Collection Time: 09/29/16 12:11 PM   Result Value Ref Range    Glucose, POC 112 (H) 70 - 100 MG/DL   PROTEIN/CR RATIO,UR RAN    Collection Time: 09/29/16  1:50 PM   Result Value Ref Range    Protein, Random 64 MG/DL    Creatinine, Random 161 MG/DL    Protein/CR ratio 0.3    TYPE & CROSSMATCH    Collection Time: 09/29/16  2:20 PM   Result Value Ref Range    Units Ordered 0     Crossmatch Expires 10/02/2016     Record Check FOUND     ABO/RH(D) A POS     Antibody Screen NEG     Electronic Crossmatch YES    CBC    Collection Time: 09/29/16  2:20 PM   Result Value Ref Range    White Blood Cells 12.1 (H) 4.5 - 11.0 K/UL    RBC 3.29 (L) 4.0 - 5.0 M/UL    Hemoglobin 9.9 (L) 12.0 - 15.0 GM/DL    Hematocrit 09.6 (L) 36 - 45 %    MCV 91.4 80 - 100 FL    MCH 30.2 26 - 34 PG    MCHC 33.0 32.0 - 36.0 G/DL    RDW 04.5 11 - 15 %    Platelet Count 328 150 - 400 K/UL    MPV 7.4 7 - 11 FL   COMPREHENSIVE METABOLIC PANEL    Collection Time: 09/29/16  2:20 PM   Result Value Ref Range    Sodium 135 (L) 137 - 147 MMOL/L    Potassium 4.8 3.5 - 5.1 MMOL/L    Chloride 101 98 - 110 MMOL/L    Glucose 96 70 - 100 MG/DL    Blood Urea Nitrogen 13 7 - 25 MG/DL    Creatinine 4.09 0.4 - 1.00 MG/DL    Calcium 9.1 8.5 - 81.1 MG/DL    Total Protein 7.0 6.0 - 8.0 G/DL    Total Bilirubin 0.5 0.3 - 1.2 MG/DL Albumin 3.2 (L) 3.5 - 5.0 G/DL    Alk Phosphatase 88 25 - 110 U/L    AST (SGOT) 19 7 - 40 U/L    CO2 25 21 - 30 MMOL/L    ALT (SGPT) 11 7 - 56 U/L    Anion Gap 9 3 - 12    eGFR Non African American >60 >60 mL/min    eGFR African American >60 >60 mL/min   LDH-LACTATE DEHYDROGENASE    Collection Time: 09/29/16  2:20 PM   Result Value Ref Range    Lactate Dehydrogenase 250 (H) 100 -  210 U/L   URIC ACID    Collection Time: 09/29/16  2:20 PM   Result Value Ref Range    Uric Acid 4.8 2.0 - 7.0 MG/DL   KLEIHAUER-BETKE FETAL HGB ST    Collection Time: 09/29/16  2:20 PM   Result Value Ref Range    Fetal Stain NO FETAL CELLS OBSERVED    FIBRINOGEN    Collection Time: 09/29/16  3:00 PM   Result Value Ref Range    Fibrinogen 862 (H) 200 - 400 MG/DL   PTT (APTT)    Collection Time: 09/29/16  3:00 PM   Result Value Ref Range    APTT 25.1 20.0 - 36.0 SEC   POC GLUCOSE    Collection Time: 09/29/16  3:18 PM   Result Value Ref Range    Glucose, POC 108 (H) 70 - 100 MG/DL   POC GLUCOSE    Collection Time: 09/29/16  4:33 PM   Result Value Ref Range    Glucose, POC 101 (H) 70 - 100 MG/DL   POC GLUCOSE    Collection Time: 09/29/16  6:56 PM   Result Value Ref Range    Glucose, POC 107 (H) 70 - 100 MG/DL   BLOOD GASES-CORD BLOOD    Collection Time: 09/29/16  7:09 PM   Result Value Ref Range    pH-Cord Blood 7.18     pCO2-Cord Blood 79 MMHG    pO2-Cord Blood 10 MMHG    Base Deficit-Cord Blood 2.8 MMOL/L    O2 Sat-Cord Blood 7.3 %    Cord Blood Gas Source ARTERIAL LINE    BLOOD GASES-CORD BLOOD    Collection Time: 09/29/16  7:09 PM   Result Value Ref Range    pH-Cord Blood 7.30     pCO2-Cord Blood 57 MMHG    pO2-Cord Blood 26 MMHG    Base Deficit-Cord Blood 0.3 MMOL/L    O2 Sat-Cord Blood 54.8 %    Cord Blood Gas Source VENOUS DRAW          Assessment:    36 y.o. [id number] POD#1 s/p primary low transverse c-section and Mirena placement  Super Imposed Preeclampsia with severe features.   Recovering well, hemodynamically stable AP Hgb 9.9 --> PP Hgb pending; EBL: 1200 cc   cHTN   DM2 on insulin   Fetus w/ CPAM   Anemia   BMI 65       Plan:  Super Imposed Preeclampsia with severe features  Continue postpartum magnesium   PTA Labetalol 600 mg TID and Procardia 60mg  qhs ordered   Serial blood pressure monitoring     DM2  FSBG 5x daily  Metformin 1g BID ordered  Diabetic CLD  Endocrinology consult ordered       Continue routine postop care  D/c foley  SLIV  Morphine PCA D/C later today --> Start Oxycodone and Motrin PO  Start FeSO4 325 mg and vitamin C 500mg  daily at discharge  Encourage ambulation, with assistance initially  S/p Tdap  S/p Mirena for contraception    D/w Dr. Jake Bathe, MD  PGY-2 Obstetrics and Gynecology  Please page the low risk pager at [phone] with questions

## 2016-09-30 NOTE — Anesthesia Pain Rounding
Anesthesia Follow-Up Evaluation: Post-Procedure Day One    Name: Tonya Ortiz     MRN: [id number]     DOB: [birth year]     Age: 36 y.o.     Sex: female   __________________________________________________________________________     Procedure Date: 09/29/2016   Procedure: Procedure(s):  CESAREAN SECTION    Physical Assessment  Height: 167.6 cm (66)  Weight: (!) 178.7 kg (394 lb)    Vital Signs (Last Filed in 24 hours)  BP: 129/72 (09/11 0828)  Temp: 36.7 ???C (98 ???F) (09/11 4540)  Pulse: 98 (09/11 0828)  Respirations: 20 PER MINUTE (09/11 0828)  SpO2: 97 % (09/11 0828)  O2 Delivery: None (Room Air) (09/11 0828)  SpO2 Pulse: 102 (09/11 0000)  Height: 167.6 cm (66) (09/10 1158)    Patient History   Allergies  Allergies   Allergen Reactions   ??? Latex HIVES        Medications  Scheduled Meds:  acetaminophen (TYLENOL) tablet 650 mg 650 mg Oral Q6H*   diphtheria/tetanus/pertus(acell) booster (Tdap) (BOOSTRIX) injection 0.5 mL 0.5 mL Intramuscular ONCE   docusate (COLACE) capsule 100 mg 100 mg Oral BID   ibuprofen (MOTRIN) tablet 600 mg 600 mg Oral Q6H   labetalol (NORMODYNE) tablet 600 mg 600 mg Oral TID   metFORMIN (GLUCOPHAGE) tablet 1,000 mg 1,000 mg Oral BID w/meals   NIFEdipine XL (PROCARDIA-XL) tablet 60 mg 60 mg Oral QHS   vitamins, prenatal w/iron & folate tablet 1 tablet 1 tablet Oral QHS   Continuous Infusions:  ??? lactated ringers infusion 125 mL/hr at 09/30/16 0544   ??? magnesium sulfate  20 g/500 mL infusion 1 g/hr (09/30/16 0749)   ??? morphine PCA   55 mg/NS 55ml infusion syr (std conc)(premade)       PRN and Respiratory Meds:cyclobenzaprine TID PRN, famotidine BID PRN, lanolin PRN, milk of magnesia (CONC) Q6H PRN, naloxone PRN, ondansetron (ZOFRAN) IV Q4H PRN, oxyCODONE Q4H PRN, simethicone Q6H PRN      Diagnostic Tests  Hematology: Lab Results   Component Value Date    HGB 8.1 09/30/2016    HCT 24.2 09/30/2016    PLTCT 252 09/30/2016    WBC 8.8 09/30/2016    NEUT 64 09/08/2016    ANC 3.50 09/08/2016 ALC 1.70 09/08/2016    MONA 5 09/08/2016    AMC 0.30 09/08/2016    EOSA 0 09/08/2016    ABC 0.00 09/08/2016    MCV 92.5 09/30/2016    MCH 30.9 09/30/2016    MCHC 33.4 09/30/2016    MPV 7.0 09/30/2016    RDW 13.7 09/30/2016         General Chemistry: Lab Results   Component Value Date    NA 135 09/29/2016    K 4.8 09/29/2016    CL 101 09/29/2016    CO2 25 09/29/2016    GAP 9 09/29/2016    BUN 13 09/29/2016    CR 0.75 09/29/2016    GLU 96 09/29/2016    CA 9.1 09/29/2016    ALBUMIN 3.2 09/29/2016    TOTBILI 0.5 09/29/2016      Coagulation:   Lab Results   Component Value Date    PTT 25.1 09/29/2016         Follow-Up Assessment  Patient location during evaluation: floor      Anesthetic Complications:   Anesthetic complications: The patient did not experience any anesthestic complications.      Pain:  Score: 3    Management:adequate (has not required  significant use of morphine PCA. plan to advance to PO regimen today per primary team)     Level of Consciousness: awake and alert   Hydration:acceptable     Airway Patency: patent   Respiratory Status: acceptable     Cardiovascular Status:acceptable   Regional/Neuroaxial:

## 2016-09-30 NOTE — Other
Brief Operative Note    Name: Tonya Ortiz is a 36 y.o. female     DOB: [birth year]             MRN#: [id number]  DATE OF OPERATION: 09/29/2016    Date:  09/29/2016        Preoperative Dx:   * Fetal intolerance of labor*    * Fetal intolerance of labor *    Procedure(s) (LRB):  CESAREAN SECTION (Bilateral), Mirena IUD placement     Anesthesia Type: Defer to Anesthesia    Surgeon(s) and Role:     * Irven BaltimoreEstrada-Stephen, Karen E, MD - Resident - Assisting     * Janice NorrieFrench, Raenette RoverValerie A, MD - Primary     * Rosalio LoudMcLaren, Narciso Stoutenburg, MD - Resident - Assisting      Findings:  Normal appearing uterus, tubes and ovaries. Viable female infant. APGARS pending     Estimated Blood Loss: 1200 ml    Specimen(s) Removed/Disposition: Placenta     Complications:  None    Implants: Mirena IUD    Drains: Other foley to gravity     Disposition:  PACU - stable    Rosalio LoudHillary Cj Edgell, MD  Pager [phone]

## 2016-09-30 NOTE — Progress Notes
1915: This RN to OR to assume care from Recardo Evangelist. Zganjar, RN.     2030: First fundal check in OR. D/t pt's habitus, unable to feel fundus. Minimal bleeding noted at this time.     2045: Out of OR2 and into room 5406 for recovery.     [id number]: Pt transferred to 5623. Report given to S. Lorrene ReidBrunner, RN. Care of stable pt relinquished at this time.

## 2016-09-30 NOTE — Progress Notes
0108-Pt arrived to unit from L/D  via bed post C/S.  Pt is A&O X3.  Foley to dd draining dark yellow urine.  Encourage pt to drink water.  Pt is calm and cooperative with all cares.  IV infusing without incident.  Baby girl is in the NICU.  Call light is within reach; continue to monitor.

## 2016-09-30 NOTE — Case Management (ED)
Case Management Progress Note    NAME:Tonya Ortiz                          MRN: [id number]              DOB:[birth year]          AGE: 36 y.o.  ADMISSION DATE: 09/29/2016             DAYS ADMITTED: LOS: 1 day      Today???s Date: 09/30/2016    Plan  Pt is a 35yo [id number] who delivered BG Sheppard Evens via C/S on 9/10 at 37.0wga. Baby was admitted to the NICU due suspected CPAM.     Anticipate discharge in 1-2 days.    Interventions  ? Support   Support: Pt/Family Updates re:POC or DC Plan, Patient Education   NCM introduced self, explained role, and provided contact information.??? NCM discussed NICU expectations, parking policy, and discharge planning.??? NCM provided parents with NICU support resources.    NCM discussed NICU expectations and provided helpful hints for families with a baby in the NICU sheet. Reviewed family meetings, parking policy, rounding with MD and availability of Dr. Trudie Reed.     ? Info or Referral   Information or Referral to [employer]: Diagnosis-Related [employer], Mental Health Resources   NCM discussed PPD and baby blues. Provided written info, and a list of local resources. Reviewed symptoms to watch for, when to speak with MD and when to seek care in the ED. Encouraged pt to share info with her SO and close family.     ? Discharge Planning   Discharge Planning: Durable Medical Equipment and Supplies   Pt has all necessary baby items including car seat, crib, clothes and diapers.     Pt is breast feeding and NCM provided a new electric breast pump through [employer].    ? Medication Needs   Medication Needs: No Needs Identified  ? Financial   Financial: Chiropractor, Medicare/Medicaid/SSDI Information   Reiterated importance of applying for Medicaid for baby within 30 days.    ? Legal   Legal: No Needs Identified  ? Other   Other/None: No needs identified    Disposition  ? Expected Discharge Date Expected Discharge Date: 10/02/16  ? Transportation   Does the patient need discharge transport arranged?: No  Transportation Name, Phone and Availability #1: unsure at this time  Does the patient use Medicaid Transportation?: No    Case Management Admission Assessment    NAME:Tonya Ortiz                          MRN: [id number]             DOB:July 03, 1980          AGE: 37 y.o.  ADMISSION DATE: 09/29/2016             DAYS ADMITTED: LOS: 1 day      Today???s Date: 09/30/2016    Source of Information: Pt, EMR       Plan  Plan: CM Assessment, Psychosocial Assessment, Assist PRN with SW/NCM Services, Discharge Planning for Home Anticipated, No Further Intervention Required    Patient Address/Phone  [address]  [place] [address]  [phone] (home)     Emergency Contact  Extended Emergency Contact Information  Primary Emergency Contact: Lawana [place]  Home Phone: [phone]  Relation: Friend    Forensic scientist: No, patient does not have a healthcare directive  Would patient like to fill out a (a new) Editor, commissioning?: No, patient declined  Psych Advance Directive (Psych unit only): No, patient does not have a Social research officer, government  Does the patient need discharge transport arranged?: No  Transportation Name, Phone and Availability #1: unsure at this time  Does the patient use Medicaid Transportation?: No    Expected Discharge Date  Expected Discharge Date: 10/02/16    Living Situation Prior to Admission  ? Living Arrangements  Type of Residence: Home, independent  Living Arrangements: Alone  Assistance needed prior to admit or anticipated on discharge: No  Who provides assistance or could if needed?: family  Are they in good health?: Yes  Can support system provide 24/7 care if needed?: Yes   Address and phone numbers verified. Pt resides alone.    FOB is Bartholome Bill. He is her spouse, but he has filed for divorce.    ? Level of Function Prior level of function: Independent  ? Cognitive Abilities   Cognitive Abilities: Alert and Oriented, Participates in decision making    Financial Resources  ? Coverage  Primary Insurance: Medicaid  Secondary Insurance: No insurance  Additional Coverage: [employer] Medicaid.    ? Source of Income   Source Of Income: Employed, [place], TANF    Pt works in [employer] at [employer].   Enrolled in [facility]. Cash assistance $224/mon.    ? Financial Assistance Needed?      Psychosocial Needs  ? Mental Health  Mental Health History: No  ? Substance Use History  Substance Use History Screen: No  ? Other      Current/Previous Services  ? PCP  No Pcp, Na, None, None  ? Pharmacy    [facility] [address], [place] - [address] [place] [address] [place] [address]  [place] [address]  Phone: [phone] Fax: [phone]    [facility])  [phone] [address]  [place] [address]  Phone: [phone] Fax: [phone]    ? Durable Medical Equipment   Durable Medical Equipment at home: None  ? Home Health     ? Hemodialysis or Peritoneal Dialysis     ? Tube/Enteral Feeds     ? Infusion     ? Private Duty     ? Home and [employer]     ? [facility]     ? Hospice     ? Outpatient Therapy     ? Skilled Nursing Facility/Nursing Home     ? Inpatient Rehab     ? Long-Term Acute Care Hospital     ? Acute Hospital Stay  Acute Hospital Stay: Yes  Was patient's stay within the last 30 days?: Yes  When did patient receive care?: 09/19/16-09/20/16  Name of hospital: [facility]  Readmission Code Group: 5. Medical Plan of Care - Treatment or Possible Complication  5. Medical Plan of Care - Treatment or Possible Complication: 83f. OB/L&D related  Related or Unrelated?: Related    Ronald Pippins, BSN, RNC-MNN  Nurse Case Manager-NICU  [facility]  Phone: [phone]  Pager: [phone]  Work Cell: [phone]

## 2016-09-30 NOTE — Consults
Endocrinology Consult        Today's Date:  09/30/2016  Admission Date: 09/29/2016  LOS: 1 day      Reason for Consult:  postpartum DMII  Consult type: Co-management w/signed orders      Assessment:  Active Problems:    Hypertension in pregnancy    Morbid obesity (HCC)      Diabetes mellitus: Type 2, Controlled  ? A1c: 4.8% . Goal A1c < 6.5% without hypoglycemia  ? DM provider as outpatient: PCP  ? Complications:   o Retinopathy: No  o Peripheral neuropathy: no  o Autonomic neuropathy: no  o Nephropathy: no  o Macrovascular complications: no  o Chronic wound: no  o Amputations: no  ? Prior to pregnancy regimen: Metformin 1000 mg BID  ? Other DM medications:  o Statin: No  Last lipid profile -   Lab Results   Component Value Date    CHOL 187 08/08/2014    TRIG 261 (H) 08/08/2014    HDL 47 08/08/2014    LDL 88 08/08/2014    VLDL 52 (H) 08/08/2014     o ACEi/ARB: No  o On ASA? (over age 35): yes    Interpretation and recommendations for today:  ? Pt is now postpartum, no longer requiring insulin. Received 1000 mg Metformin this am and tolerating well. Has not eaten today, is on a diabetic clear liquid diet.  ? Continue Metformin 1000 mg BID  ? If BG become more elevated, > 140, would start low dose correction factor Novolog insulin.  ? Discussed need for weight loss, encouraged breast feeding  ? Would be a candidate for a GLP-1, after breast feeding is complete, to help aid in weight loss          ATTESTATION    I personally performed the key portions of the E/M visit, discussed case with resident and concur with resident documentation of history, physical exam, assessment, and treatment plan unless otherwise noted.    Staff name:  Darcel Bayley, MD Date:  09/30/2016     We will sign off on this patient     Patient can follow up with her pcp      Patient seen and discussed with Dr. Barbara Cower      ______________________________________________________________________ History of Present Illness: Tonya Ortiz is a 36 y.o. female her for delivery. Had Type 2 Diabetes prior to pregnancy, was treated by PCP and well controlled with Metformin 1000 mg BID. Was on insulin during pregnancy and an insulin gtt during delivery. She's no longer requiring insulin and continues on Metformin.      Past Medical History:   Diagnosis Date   ??? Diabetes (HCC) 09/07/2014   ??? Hypertension    ??? Obesity 09/07/2014     No past surgical history on file.  Social History     Social History   ??? Marital status: Single     Spouse name: N/A   ??? Number of children: N/A   ??? Years of education: N/A     Social History Main Topics   ??? Smoking status: Former Smoker     Packs/day: 0.05     Years: 0.25     Types: Cigarettes     Quit date: 04/21/2015   ??? Smokeless tobacco: Never Used   ??? Alcohol use No   ??? Drug use: No   ??? Sexual activity: Not on file     Other Topics Concern   ??? Not on file  Social History Narrative   ??? No narrative on file     Family History   Problem Relation Age of Onset   ??? Hypertension Mother    ??? Hypertension Father    ??? High Cholesterol Father    ??? Hypertension Paternal Grandmother    ??? Diabetes Paternal Grandmother        Allergies:  Latex    Scheduled Meds:    acetaminophen (TYLENOL) tablet 650 mg 650 mg Oral Q6H*   diphtheria/tetanus/pertus(acell) booster (Tdap) (BOOSTRIX) injection 0.5 mL 0.5 mL Intramuscular ONCE   docusate (COLACE) capsule 100 mg 100 mg Oral BID   ibuprofen (MOTRIN) tablet 600 mg 600 mg Oral Q6H   labetalol (NORMODYNE) tablet 600 mg 600 mg Oral TID   metFORMIN (GLUCOPHAGE) tablet 1,000 mg 1,000 mg Oral BID w/meals   NIFEdipine XL (PROCARDIA-XL) tablet 60 mg 60 mg Oral QHS   vitamins, prenatal w/iron & folate tablet 1 tablet 1 tablet Oral QHS   Continuous Infusions:  ??? lactated ringers infusion 125 mL/hr at 09/30/16 1328   ??? magnesium sulfate  20 g/500 mL infusion 1 g/hr (09/30/16 1328)   ??? morphine PCA   55 mg/NS 55ml infusion syr (std conc)(premade) PRN and Respiratory Meds:cyclobenzaprine TID PRN, famotidine BID PRN, lanolin PRN, milk of magnesia (CONC) Q6H PRN, naloxone PRN, ondansetron (ZOFRAN) IV Q4H PRN, oxyCODONE Q4H PRN, simethicone Q6H PRN      Review of Systems:  Denies: BOB, Chest Pain, Nausea, Vomiting    Vital Signs:  Last Filed in 24 hours Vital Signs:  24 hour Range    BP: 129/68 (09/11 1227)  Temp: 36.2 ???C (97.2 ???F) (09/11 1227)  Pulse: 92 (09/11 1227)  Respirations: 25 PER MINUTE (09/11 1227)  SpO2: 97 % (09/11 1227)  O2 Delivery: None (Room Air) (09/11 1003)  SpO2 Pulse: 102 (09/11 0000) BP: (115-165)/(61-106)   Temp:  [36.2 ???C (97.2 ???F)-37 ???C (98.6 ???F)]   Pulse:  [85-112]   Respirations:  [18 PER MINUTE-28 PER MINUTE]   SpO2:  [92 %-100 %]   O2 Delivery: None (Room Air)     Physical Exam:    General:  Alert, cooperative, no distress, appears stated age  Head:  Normocephalic, without obvious abnormality, atraumatic    ENT: Moist mucous membranes  Lungs:  Clear to auscultation bilaterally  Heart:    Regular rate and rhythm  Abdomen:  Soft, non-tender, obese, hypoactive bowel sounds.   Extremities:  Extremities normal, atraumatic  Skin: No rash or lesions noted        Lab:    Recent Labs      09/29/16   1420   NA  135*   K  4.8   CL  101   CO2  25   GAP  9   BUN  13   CR  0.75   GLU  96   CA  9.1   ALBUMIN  3.2*       Recent Labs      09/29/16   1420  09/29/16   1500  09/30/16   0705   WBC  12.1*   --   8.8   HGB  9.9*   --   8.1*   HCT  30.1*   --   24.2*   PLTCT  328   --   252   PTT   --   25.1   --    AST  19   --    --  ALT  11   --    --    ALKPHOS  88   --    --       Estimated Creatinine Clearance: 178.7 mL/min (based on SCr of 0.75 mg/dL).  Vitals:    09/29/16 1158   Weight: (!) 178.7 kg (394 lb)        Glucose, POC   Date/Time Value Ref Range Status   09/30/2016 1227 114 (H) 70 - 100 MG/DL Final   45/40/9811 9147 104 (H) 70 - 100 MG/DL Final   82/95/6213 0865 107 (H) 70 - 100 MG/DL Final 78/46/9629 5284 132 (H) 70 - 100 MG/DL Final   44/01/270 5366 108 (H) 70 - 100 MG/DL Final   44/03/4740 5956 112 (H) 70 - 100 MG/DL Final   38/75/6433 2951 95 70 - 100 MG/DL Final   88/41/6606 3016 96 70 - 100 MG/DL Final       Radiology and other Diagnostics Review:   No pertinent radiology.

## 2016-10-01 ENCOUNTER — Encounter: Admit: 2016-10-01 | Discharge: 2016-10-01 | Primary: Adult Health

## 2016-10-01 DIAGNOSIS — E669 Obesity, unspecified: ICD-10-CM

## 2016-10-01 DIAGNOSIS — I10 Essential (primary) hypertension: ICD-10-CM

## 2016-10-01 DIAGNOSIS — E119 Type 2 diabetes mellitus without complications: Principal | ICD-10-CM

## 2016-10-01 LAB — POC GLUCOSE
Lab: 106 mg/dL — ABNORMAL HIGH (ref >60)
Lab: 122 mg/dL — ABNORMAL HIGH (ref 70–100)
Lab: 145 mg/dL — ABNORMAL HIGH (ref 70–100)

## 2016-10-01 MED ORDER — LABETALOL 200 MG PO TAB
200 mg | Freq: Three times a day (TID) | ORAL | 0 refills | Status: DC
Start: 2016-10-01 — End: 2016-10-02
  Administered 2016-10-01 – 2016-10-02 (×5): 200 mg via ORAL

## 2016-10-01 NOTE — Progress Notes
[employer] paged low risk ([phone]). Notified BP at 12 was 146/91 HR93 just walked into bathroom and was now sitting on bed, repeat resting blood pressure was 142/88 HR101 at 1302. Both events denied chest pain, dizziness, epigastric pain, blurry vision.

## 2016-10-01 NOTE — Progress Notes
OB NOTE POD#2      Subjective:    NAE overnight.Pain well controlled. Ambulating and voiding. Tolerating regular diet and passing flatus. Breast feeding. S/p mirena for [facility]  Denies HA, vision changes, CP, SOB, RUQ pain, N/V, LE edema, or dysuria    Objective:    Vitals:   Vitals:    09/30/16 1442 09/30/16 1642 09/30/16 2056 10/01/16 0107   BP: 113/60 118/64 120/74 139/81   Pulse: 89 71 93 92   Temp: 36.8 ???C (98.2 ???F) 36.4 ???C (97.5 ???F) 36.6 ???C (97.9 ???F) 36.8 ???C (98.2 ???F)   SpO2: 99% 97% 96% 93%   Weight:       Height:         Gen: NAD  Chest: unlabored  Abd: ND, appropriately ttp, firm fundus at umbilicus  Incision: dressing in place, no shadowing   Ext: Nttp, no edema in lower ext bilaterally    24-hour labs:    Results for orders placed or performed during the hospital encounter of 09/29/16 (from the past 24 hour(s))   CBC    Collection Time: 09/30/16  7:05 AM   Result Value Ref Range    White Blood Cells 8.8 4.5 - 11.0 K/UL    RBC 2.61 (L) 4.0 - 5.0 M/UL    Hemoglobin 8.1 (L) 12.0 - 15.0 GM/DL    Hematocrit 16.1 (L) 36 - 45 %    MCV 92.5 80 - 100 FL    MCH 30.9 26 - 34 PG    MCHC 33.4 32.0 - 36.0 G/DL    RDW 09.6 11 - 15 %    Platelet Count 252 150 - 400 K/UL    MPV 7.0 7 - 11 FL   POC GLUCOSE    Collection Time: 09/30/16  9:11 AM   Result Value Ref Range    Glucose, POC 104 (H) 70 - 100 MG/DL   POC GLUCOSE    Collection Time: 09/30/16 12:27 PM   Result Value Ref Range    Glucose, POC 114 (H) 70 - 100 MG/DL   POC GLUCOSE    Collection Time: 09/30/16  6:40 PM   Result Value Ref Range    Glucose, POC 112 (H) 70 - 100 MG/DL         Assessment:    36 y.o. [id number] POD#2 s/p primary low transverse c-section and Mirena placement  Super Imposed Preeclampsia with severe features.   Recovering well, hemodynamically stable  AP Hgb 9.9 --> PP Hgb 8.1; EBL: 1200 cc   cHTN   DM2  Fetus w/ CPAM   Anemia   BMI 65       Plan:  Super Imposed Preeclampsia with severe features  s/p postpartum magnesium PTA Labetalol 400 mg TID and Procardia 60mg  qhs ordered   Serial blood pressure monitoring     DM2  FSBG 5x daily  Metformin 1g BID ordered  Diabetic CLD  S/p Endocrinology        Continue routine postop care  D/c foley  SLIV  Morphine PCA D/C later today --> Start Oxycodone and Motrin PO  Start FeSO4 325 mg and vitamin C 500mg  daily at discharge  Encourage ambulation, with assistance initially  S/p Tdap  S/p Mirena for contraception    D/w Dr. Jake Bathe, MD  PGY-2 Obstetrics and Gynecology  Please page the low risk pager at [phone] with questions

## 2016-10-02 ENCOUNTER — Inpatient Hospital Stay: Admit: 2016-09-29 | Discharge: 2016-09-29 | Primary: Adult Health

## 2016-10-02 ENCOUNTER — Inpatient Hospital Stay: Admit: 2016-09-29 | Discharge: 2016-10-02 | Disposition: A | Discharge status: Home / Self Care | Payer: MEDICAID

## 2016-10-02 ENCOUNTER — Encounter: Admit: 2016-10-02 | Discharge: 2016-10-02 | Primary: Adult Health

## 2016-10-02 DIAGNOSIS — O99214 Obesity complicating childbirth: ICD-10-CM

## 2016-10-02 DIAGNOSIS — Z794 Long term (current) use of insulin: ICD-10-CM

## 2016-10-02 DIAGNOSIS — O114 Pre-existing hypertension with pre-eclampsia, complicating childbirth: Principal | ICD-10-CM

## 2016-10-02 DIAGNOSIS — Z7984 Long term (current) use of oral hypoglycemic drugs: ICD-10-CM

## 2016-10-02 DIAGNOSIS — O2412 Pre-existing diabetes mellitus, type 2, in childbirth: ICD-10-CM

## 2016-10-02 DIAGNOSIS — E119 Type 2 diabetes mellitus without complications: ICD-10-CM

## 2016-10-02 DIAGNOSIS — O99824 Streptococcus B carrier state complicating childbirth: ICD-10-CM

## 2016-10-02 DIAGNOSIS — O320XX0 Maternal care for unstable lie, not applicable or unspecified: ICD-10-CM

## 2016-10-02 DIAGNOSIS — Z87891 Personal history of nicotine dependence: ICD-10-CM

## 2016-10-02 DIAGNOSIS — O1092 Unspecified pre-existing hypertension complicating childbirth: ICD-10-CM

## 2016-10-02 DIAGNOSIS — D649 Anemia, unspecified: ICD-10-CM

## 2016-10-02 DIAGNOSIS — O9081 Anemia of the puerperium: ICD-10-CM

## 2016-10-02 DIAGNOSIS — Z6841 Body Mass Index (BMI) 40.0 and over, adult: ICD-10-CM

## 2016-10-02 DIAGNOSIS — Z30430 Encounter for insertion of intrauterine contraceptive device: ICD-10-CM

## 2016-10-02 DIAGNOSIS — Z3A37 37 weeks gestation of pregnancy: ICD-10-CM

## 2016-10-02 LAB — POC GLUCOSE
Lab: 113 mg/dL — ABNORMAL HIGH (ref 70–100)
Lab: 130 mg/dL — ABNORMAL HIGH (ref 70–100)

## 2016-10-02 MED ORDER — SIMETHICONE 80 MG PO CHEW
80 mg | ORAL_TABLET | ORAL | 0 refills | Status: AC | PRN
Start: 2016-10-02 — End: 2017-02-23

## 2016-10-02 MED ORDER — IBUPROFEN 600 MG PO TAB
600 mg | ORAL_TABLET | ORAL | 0 refills | Status: AC
Start: 2016-10-02 — End: 2017-02-23
  Filled 2016-10-03 (×2): qty 30, 8d supply, fill #1

## 2016-10-02 MED ORDER — METFORMIN 1,000 MG PO TAB
1000 mg | ORAL_TABLET | Freq: Two times a day (BID) | ORAL | 1 refills | Status: AC
Start: 2016-10-02 — End: ?

## 2016-10-02 MED ORDER — NIFEDIPINE 60 MG PO TR24
60 mg | ORAL_TABLET | Freq: Every evening | ORAL | 1 refills | Status: SS
Start: 2016-10-02 — End: 2016-10-19

## 2016-10-02 MED ORDER — LABETALOL 200 MG PO TAB
200 mg | ORAL_TABLET | Freq: Three times a day (TID) | ORAL | 0 refills | Status: SS
Start: 2016-10-02 — End: 2016-10-19

## 2016-10-02 MED ORDER — OXYCODONE 5 MG PO TAB
5-10 mg | ORAL_TABLET | ORAL | 0 refills | 6.00000 days | Status: AC | PRN
Start: 2016-10-02 — End: 2017-02-23
  Filled 2016-10-04 (×2): qty 30, 5d supply, fill #1

## 2016-10-02 NOTE — Progress Notes
Lactation follow up at 1130 feeding, mom attempting to provide a bottle at this time.  Babe sleepy and uninterested, though does take a few sucks once roused.      Mom states she plans to bottle feed for now, but wants to work on bringing in milk via pumping at home.  Mom has pumped here ~2x since delivery.  Education reviewed on pumping frequency and duration and steps necessary to bring the milk in and drive an adequate milk supply.  Mom voices her understanding.  She reports pumping is comfortable, denies pain or soreness in her breasts or nipples.    Reminder given to take all of her pump kit pieces with her at discharge.  Mom has received a pump through insurance.  Offered to review/demonstrate set up and use with her- mom declined.      Mom's cheeks noted to be very flushed, pale around the mouth, sweating.  Mom states she feels fine.  Mom's RN notified of mom's appearance and states she will go take vitals.

## 2016-10-02 NOTE — Discharge Instructions - Appointments
Please call [phone] to schedule a post partum visit in 1 weeks with [facility] for Blood pressure check. If you are planning on obtaining a contraceptive method at this visit (such as an intrauterine device, depo-provera, or nexplanon), please note this to the scheduler.

## 2016-10-02 NOTE — Progress Notes
OB NOTE POD#3      Subjective:    NAE overnight.Pain well controlled. Ambulating and voiding. Tolerating regular diet and passing flatus. Breast feeding. S/p mirena for [facility]  Denies HA, vision changes, CP, SOB, RUQ pain, N/V, LE edema, or dysuria    Objective:    Vitals:   Vitals:    10/01/16 1643 10/01/16 2108 10/02/16 0011 10/02/16 0505   BP: 130/68 134/77 128/82 131/80   Pulse: 100 101 100 95   Temp: 36.7 ???C (98.1 ???F) 36.6 ???C (97.9 ???F) 36.7 ???C (98.1 ???F) 36.8 ???C (98.2 ???F)   SpO2: 98% 99% 96% 98%   Weight:       Height:         Gen: NAD  Chest: unlabored  Abd: ND, appropriately ttp, firm fundus at umbilicus  Incision: c/d/i  Ext: Nttp, no edema in lower ext bilaterally    24-hour labs:    Results for orders placed or performed during the hospital encounter of 09/29/16 (from the past 24 hour(s))   POC GLUCOSE    Collection Time: 10/01/16  8:27 AM   Result Value Ref Range    Glucose, POC 106 (H) 70 - 100 MG/DL   POC GLUCOSE    Collection Time: 10/01/16 11:57 AM   Result Value Ref Range    Glucose, POC 122 (H) 70 - 100 MG/DL   POC GLUCOSE    Collection Time: 10/01/16  4:41 PM   Result Value Ref Range    Glucose, POC 145 (H) 70 - 100 MG/DL         Assessment:    36 y.o. [id number] POD#3 s/p primary low transverse c-section and Mirena placement  Super Imposed Preeclampsia with severe features.   Recovering well, hemodynamically stable  AP Hgb 9.9 --> PP Hgb 8.1; EBL: 1200 cc   cHTN   DM2  Fetus w/ CPAM   Anemia   BMI 65       Plan:  Super Imposed Preeclampsia with severe features  s/p postpartum magnesium   PTA Labetalol 200 mg TID and Procardia 60mg  qhs ordered   Serial blood pressure monitoring     DM2  FSBG 5x daily  Metformin 1g BID ordered  Diabetic CLD  S/p Endocrinology        Continue routine postop care  SLIV   Oxycodone and Motrin PO  Start FeSO4 325 mg and vitamin C 500mg  daily at discharge  Encourage ambulation, with assistance initially  S/p Tdap  S/p Mirena for contraception Discharge today, f/u in on week for BP check     D/w Dr. Jake Bathe, MD  PGY-2 Obstetrics and Gynecology  Please page the low risk pager at [phone] with questions

## 2016-10-02 NOTE — Progress Notes
2100- Pt states she continues to have headache, 9/10 pain, and nausea. Pt given zofran, ibuprofen (see MAR.) Pt refused fundal check d/t pain.    2118- Physician paged.

## 2016-10-02 NOTE — Progress Notes
1435 - Educated pt regarding discharge instructions, addressed all questions and ensured understanding.  Pt verbalized understanding of all instructions and teaching.

## 2016-10-02 NOTE — Discharge Instructions
Signs & Symptoms:  *Temperature over 101 F - Temperatura mas alta que 101 F  *Soaking more than 1 pad/hour - Empapa mas de una toalla femina por hora  *Clots larger than a quarter - Coagulos mas grandes que una moneda de $.25  *Bleeding goes back to bright red - Sangrado de color rojo fresco  *Foul smelling discharge - Flujo con mal olor  *Painful lump in breast -  Bulto que duele en el seno  *Pain meds not working - Las medicinas para el dolor no la alivian  *Incision drainage - Drenaje de la incision  *Incision red, swollen and warm - Incision esta roja, hinchada y calien  *Trouble breathing - Dificultades con la respiracion  *Chest pain - Dolor del pecho  *Calf pain with redness and warmth - Dolor en la pantorrilla con enrojecimiento y calor  *Headache accompanied by vision changes - Dolor de cabeza acompaado de cambios en la visin

## 2016-10-03 ENCOUNTER — Encounter: Admit: 2016-10-03 | Discharge: 2016-10-03 | Primary: Adult Health

## 2016-10-03 MED FILL — NIFEDIPINE 60 MG PO TR24: 60 mg | ORAL | 30 days supply | Qty: 30 | Fill #1 | Status: CP

## 2016-10-03 MED FILL — LABETALOL 200 MG PO TAB: 200 mg | ORAL | 30 days supply | Qty: 90 | Fill #1 | Status: CP

## 2016-10-03 NOTE — Discharge Instructions - Pharmacy
Physician Discharge Summary      Name: Tonya Ortiz  Medical Record Number: [id number]        Account Number:  [id number]  Date Of Birth:  November 01, 1980                         Age:  36 years   Admit date:  09/29/2016                     Discharge date:  10/02/2016    Attending Physician:  Carleene Overlie, MD               Service: Obstetrics    Physician Summary completed by: Phill Mutter, MD    Reason for hospitalization: cesarean for transverese lie in the setting of superimposed preeclampsia     Significant PMH:   Past Medical History:   Diagnosis Date   ??? Diabetes (HCC) 09/07/2014   ??? Hypertension    ??? Obesity 09/07/2014         Allergies: Latex    Admission Physical Exam notable for:    Physical Exam:  Vital Signs: Last Filed In 24 Hours                Vital Signs: 24 Hour Range   BP: 148/96 (09/10 0918)  Pulse: 118 (09/10 0918)  Height: 165.1 cm (65) (09/10 0918) BP: (148)/(96)   Pulse:  [118]    Intensity Pain Scale (Self Report): (not recorded) ???   ???  Gen: NAD  CV: Regular rate  Chest: Unlabored breathing  Abd: Gravid, nttp, obsese, Leopold's limited EFW 3100g   Ext: No LE edema, nttp LE  ???  SSE: deferred   TOCO:  0/10  FHR:  145, mod var, reactive    Admission Lab/Radiology studies notable for: A POS, Ab-, PapLSIL, RI, HIV-, HBsAg-, Syph AB-, GC/CT -/-, GBS+    Brief Hospital Course:   This 36 y.o. [id number] @ [redacted]w[redacted]d presented to labor and delivery for abdominal pain and hypertension. Patient was placed and CFM/TOCO and workup initiated for rule out superimposed preeclampsia. Patient had persistent severe range pressures which were treated with IV labetalol and she was started on Magnesium. TOCO demonstrated frequent contractions and patient reported continued abdominal pain and labs were sent for concern for abruption which were negative. SVE on admission demonstrated minimally dilated cervix 1.5/3/-3 which made some change to 1.5/1.5/-3 over 3 hours of monitoring. Fetus also had unstable lie over the last two ultrasounds and presentation was assessed by ultrasound again and found to be vertex. Fetal CPAM dimensions were again measured and relayed to NICU staff. CFM demonstrated FHT baseline 150-155 with moderate variability at times strip was appropriately reactive, however shallow decelerations late in timing were noted with some contractions and it was felt fetus would not tolerate induction of labor. The patient was recommended for a cesarean section due to a failed contraction stress test.    1) Procedure: primaru low transverse c-section, viable female  infant born on 09/29/2016  at 7:09 PM , weighing 3480 g (7 lb 10.8 oz) g, APGARs 2  /8  /N/A . Cord with 2 Vessels .   2) Post partum: pt received post partum magnesium. On POD#1, foley catheter was removed, pt was transitioned to oral pain meds and tolerated a regular diet. She received an endocrinology consult form [facility], Mirena IUD for birth control. Post partum Hb 8.1, rubella immune, blood type  A POS. Pt d/c to home on PPD#3 after meeting all d/c criteria. She will follow up in 6 weeks with [facility].    Lab Results   Component Value Date    HGB 8.1 (L) 09/30/2016     No results found for: RUBELLA      Condition at Discharge: Stable    Discharge Diagnoses:      Hospital Problems        Active Problems    Hypertension in pregnancy    Morbid obesity [facility])          Surgical Procedures: cesarean    Significant Diagnostic Studies and Procedures: noted in brief hospital course    Consults:  Endocrinology    Patient Disposition: Home       Patient instructions/medications:     Activity as Tolerated   It is important to keep increasing your activity level after you leave the hospital.  Moving around can help prevent blood clots, lung infection (pneumonia) and other problems.  Gradually increasing the number of times you are up moving around will help you return to your normal activity level more quickly.  Continue to increase the number of times you are up to the chair and walking daily to return to your normal activity level. Begin to work toward your normal activity level at discharge     Report These Signs and Symptoms   Please contact your doctor if you have any of the following symptoms: temperature higher than 100 degrees F, uncontrolled pain, persistent nausea and/or vomiting, difficulty breathing, chest pain, severe abdominal pain, headache, unable to urinate, unable to have bowel movement, drainage with a foul odor or  vaginal bleeding soaking one pad per hour for two hours     Questions About Your Stay   For questions or concerns regarding your hospital stay. Call [phone]   Discharging attending physician: Carleene Overlie [[id number]]      Regular Diet   You have no dietary restriction. Please continue with a healthy balanced diet.     Additional Discharge Instructions   Call or seek medical attention for chest pain, shortness of breath, fever greater than 101F, heavy vaginal bleeding soaking >1 pad per hour, increased cramping, uncontrollable pain, vaginal discharge with foul odor, incision drainage, incision red swollen & warm; headache accompanied by changes in vision, or emotional instability.    No sexual activity, tampons, douching or anything in the vagina for 6 weeks.    You may resume strenuous activity in 6 weeks.    You may return to work/school in 6 weeks.    Do not drive while taking narcotic pain medications.    May use colace, senna, or miralax for constipation.    May use lanolin cream for sore nipples.    If you have an incision, you may wash it with warm soapy water.  It does not need to be covered with a bandage.        Current Discharge Medication List       START taking these medications    Details   ibuprofen (MOTRIN) 600 mg tablet Take one tablet by mouth every 6 hours. Take with food.  Qty: 30 tablet, Refills: 0    PRESCRIPTION TYPE:  Normal NIFEdipine XL (PROCARDIA-XL) 60 mg tablet Take one tablet by mouth at bedtime daily.  Qty: 30 tablet, Refills: 1    PRESCRIPTION TYPE:  Normal      oxyCODONE (ROXICODONE, OXY-IR) 5 mg tablet Take 1-2 tablets by mouth  every 4 hours as needed  Qty: 30 tablet, Refills: 0    PRESCRIPTION TYPE:  Print      simethicone (MYLICON) 80 mg chew tablet Chew one tablet by mouth every 6 hours as needed for Flatulence.  Qty: 30 tablet, Refills: 0    PRESCRIPTION TYPE:  Normal          CONTINUE these medications which have been CHANGED or REFILLED    Details   labetalol (NORMODYNE) 200 mg tablet Take one tablet by mouth three times daily for 30 days.  Qty: 90 tablet, Refills: 0    PRESCRIPTION TYPE:  Normal      metFORMIN (GLUCOPHAGE) 1,000 mg tablet Take one tablet by mouth twice daily with meals.  Qty: 180 tablet, Refills: 1    PRESCRIPTION TYPE:  Normal          CONTINUE these medications which have NOT CHANGED    Details   aspirin 325 mg tablet Take 325 mg by mouth daily. Take with food.    PRESCRIPTION TYPE:  Historical Med      cyclobenzaprine (FLEXERIL) 10 mg tablet Take 1 tablet by mouth three times daily as needed for Muscle Cramps.  Qty: 30 tablet, Refills: 2    PRESCRIPTION TYPE:  Normal  Associated Diagnoses: Muscle pain      docusate (COLACE) 100 mg capsule Take 200 mg by mouth daily.    PRESCRIPTION TYPE:  Historical Med      hydrOXYzine pamoate (VISTARIL) 50 mg capsule Take 50 mg by mouth every 6 hours as needed for Itching.    PRESCRIPTION TYPE:  Historical Med      PRENATAL VIT CALC,IRON,FOLIC (PRENATAL VITAMIN PO) Take  by mouth.    PRESCRIPTION TYPE:  Historical Med          The following medications were removed from your list. This list includes medications discontinued this stay and those removed from your prior med list in our system        insulin aspart U-100 (NOVOLOG FLEXPEN U-100 INSULIN) 100 unit/mL injection PEN        insulin NPH (NOVOLIN N NPH U-100 INSULIN) 100 unit/mL injection insulin pen needles (disposable) (B-D NANO; ULTICARE MICRO) 32 gauge x 5/32 pen needle        insulin pen needles (disposable) (BD UF NANO PEN NEEDLES) 32 gauge x 5/32 pen needle        Insulin Syringe-Needle U-100 0.5 mL 31 gauge x 5/16 syrg               Scheduled appointments:    Oct 09, 2016  8:30 AM CDT  Return Patient with Irven Baltimore, MD  [facility]) [facility] Level [address]b  [address]  [place] [address]  [phone]    Additional appointment instructions:      Please call [phone] to schedule a post partum visit in 1 weeks with [facility] for Blood pressure check. If you are planning on obtaining a contraceptive method at this visit (such as an intrauterine device, depo-provera, or nexplanon), please note this to the scheduler.                      Pending items needing follow up:     Signed:  Phill Mutter, MD  10/02/2016      cc:  Primary Care Physician:  No Pcp, Na   Referring physicians:  Self, Referral  Additional provider(s):

## 2016-10-04 ENCOUNTER — Encounter: Admit: 2016-10-04 | Discharge: 2016-10-04 | Primary: Adult Health

## 2016-10-05 ENCOUNTER — Encounter: Admit: 2016-10-05 | Discharge: 2016-10-05 | Primary: Adult Health

## 2016-10-05 NOTE — Telephone Encounter
Patient call to nursing triage line complaining of back pain. Call triaged by nurses and anesthesia was asked to return call. Concern was redirected back to [facility] team. Call returned to patient with no answer [facility]  Harriet Pho M.D.  Obstetrics and Gynecology, PGY-2  Please page Low Risk Obstetrics Pager at [phone]  Discussed with [facility]

## 2016-10-09 ENCOUNTER — Encounter: Admit: 2016-10-09 | Discharge: 2016-10-09 | Primary: Adult Health

## 2016-10-09 ENCOUNTER — Ambulatory Visit: Admit: 2016-10-09 | Discharge: 2016-10-10 | Payer: MEDICAID | Primary: Adult Health

## 2016-10-09 DIAGNOSIS — T814XXA Infection following a procedure, initial encounter: Principal | ICD-10-CM

## 2016-10-09 DIAGNOSIS — E119 Type 2 diabetes mellitus without complications: Principal | ICD-10-CM

## 2016-10-09 DIAGNOSIS — I10 Essential (primary) hypertension: ICD-10-CM

## 2016-10-09 DIAGNOSIS — E669 Obesity, unspecified: ICD-10-CM

## 2016-10-09 MED ORDER — SULFAMETHOXAZOLE-TRIMETHOPRIM 800-160 MG PO TAB
1 | ORAL_TABLET | Freq: Two times a day (BID) | ORAL | 0 refills | Status: AC
Start: 2016-10-09 — End: ?
  Filled 2016-10-09: qty 14, 7d supply
  Filled 2016-10-09 (×2): qty 14, 7d supply, fill #1

## 2016-10-09 NOTE — Progress Notes
Belkis Norbeck presents for an ultrasound encounter. Past Medical, Surgical, Family & Social History; Medications & Allergies contained in the electronic record below were not reviewed today and may not be up-to-date. Please see A/S OBGYN report for all documentation related to this encounter.    10/09/2016  Butch Penny, MA

## 2016-10-09 NOTE — Progress Notes
CC - Wound Check      Subjective:     History of present illness - Tonya Ortiz is a 36 y.o. [phone] here today for incision check 1 week s/p PLTCS and placement Mirena IUD. She has baby Tonya Ortiz with her today. Asking who she should contact about follow up for the CPAM. She has had the same piece of interdry under her panniculus since she left the hospital. States she has been taking daily showers with soap. Denies fevers/chills. Hasn't noted discharge.    ROS:  Review of Systems   Constitutional: Negative for fatigue, fever and unexpected weight change.   HENT: Negative for voice change.    Respiratory: Negative for cough and shortness of breath.    Cardiovascular: Negative for chest pain and leg swelling.   Gastrointestinal: Negative for abdominal pain, blood in stool, constipation, diarrhea, nausea and vomiting.   Genitourinary: Negative for difficulty urinating, dyspareunia, dysuria, enuresis, frequency, genital sores, hematuria, menstrual problem, pelvic pain, urgency, vaginal bleeding, vaginal discharge and vaginal pain.   Musculoskeletal: Negative for arthralgias and back pain.   Skin: Negative for rash.   Neurological: Negative for light-headedness and headaches.   Hematological: Negative for adenopathy. Does not bruise/bleed easily.   Psychiatric/Behavioral: Negative for confusion. The patient is not nervous/anxious.    ???    Past obstetrical, gyn, medical, surgical, social, and family history:  Reviewed from last visit.    Allergies  Latex     Medications  Current Outpatient Prescriptions   Medication Sig Dispense Refill   ??? aspirin 325 mg tablet Take 325 mg by mouth daily. Take with food.     ??? cyclobenzaprine (FLEXERIL) 10 mg tablet Take 1 tablet by mouth three times daily as needed for Muscle Cramps. 30 tablet 2   ??? docusate (COLACE) 100 mg capsule Take 200 mg by mouth daily.     ??? hydrOXYzine pamoate (VISTARIL) 50 mg capsule Take 50 mg by mouth every 6 hours as needed for Itching. ??? ibuprofen (MOTRIN) 600 mg tablet Take one tablet by mouth every 6 hours. Take with food. 30 tablet 0   ??? labetalol (NORMODYNE) 200 mg tablet Take one tablet by mouth three times daily for 30 days. 90 tablet 0   ??? metFORMIN (GLUCOPHAGE) 1,000 mg tablet Take one tablet by mouth twice daily with meals. 180 tablet 1   ??? NIFEdipine XL (PROCARDIA-XL) 60 mg tablet Take one tablet by mouth at bedtime daily. 30 tablet 1   ??? oxyCODONE (ROXICODONE, OXY-IR) 5 mg tablet Take 1-2 tablets by mouth every 4 hours as needed 30 tablet 0   ??? PRENATAL VIT CALC,IRON,FOLIC (PRENATAL VITAMIN PO) Take  by mouth.     ??? simethicone (MYLICON) 80 mg chew tablet Chew one tablet by mouth every 6 hours as needed for Flatulence. 30 tablet 0   ??? trimethoprim/sulfamethoxazole (BACTRIM DS) 160/800 mg tablet Take one tablet by mouth twice daily for 7 days. 14 tablet 0     No current facility-administered medications for this visit.        Objective:     Physical Exam  BP 114/72  - Pulse 82  - Ht 167.6 cm (66)  - Wt (!) 156.1 kg (344 lb 3.2 oz)  - Breastfeeding? No  - BMI 55.56 kg/m???     General:  NAD, morbidly obese female  HEENT:   Normocephalic, atraumatic  Lungs:  Labored breathing due to body habitus  Heart:  Normal rate, intact pulses  Abdomen:  Wallace Cullens  strip of damp interdry removed from under panniculus which is malodorous. There is a 4 cm area of incisional separation right medial aspect of incision with granulation tissue in the center that is beginning to look more yellow, this is probed with the q-tip and has no fluctuance or drainage. Clean ABD pads placed under fold.  Extremities:  1+ edema  Neurologic:  A&O x 3, normal mood      Assessment:     36 y.o. [id number] morbidly obese woman here for incision check 1 week after PLTCS with Mirena placement. Concern for developing incisional infection. Complicating factors include hygiene and body habitus.        Plan:     Will cover with 7 days of bactrim Script given for interdry to take to pharmacy, if they do not have this, she is to get regular gauze and change out for fresh dressing every day.  Instructed to shower daily  RTC 1 week for ongoing surveillance     Next appointment: Return in about 1 week (around 10/16/2016).    Orders Placed This Encounter   ??? DRESSING   ??? trimethoprim/sulfamethoxazole (BACTRIM DS) 160/800 mg tablet     Encounter Medications   Medications   ??? trimethoprim/sulfamethoxazole (BACTRIM DS) 160/800 mg tablet     Sig: Take one tablet by mouth twice daily for 7 days.     Dispense:  14 tablet     Refill:  0       Future Appointments  Date Time Provider Department Center   10/17/2016 11:45 AM Queen Blossom, PA-C [facility]       Harriet Pho MD PGY2  Obstetrics and Gynecology  Patient was seen and discussed with Dr. Junie Bame

## 2016-10-15 NOTE — Progress Notes
Tonya Ortiz presents for an ultrasound encounter. Past Medical, Surgical, Family & Social History; Medications & Allergies contained in the electronic record below were not reviewed today and may not be up-to-date. Please see A/S OBGYN report for all documentation related to this encounter.    10/15/2016  Tonya Cappuccio, MA

## 2016-10-15 NOTE — Progress Notes
Danicia Terhaar presents for an ultrasound encounter. Past Medical, Surgical, Family & Social History; Medications & Allergies contained in the electronic record below were not reviewed today and may not be up-to-date. Please see A/S OBGYN report for all documentation related to this encounter.    10/15/2016  Kathyrn Lass, MA

## 2016-10-16 NOTE — Progress Notes
I have reviewed the case with Dr. Estrada Stephen at the time of the visit and agree with the evaluation and plan as documented by the resident.

## 2016-10-17 ENCOUNTER — Encounter: Admit: 2016-10-17 | Discharge: 2016-10-17 | Primary: Adult Health

## 2016-10-17 ENCOUNTER — Ambulatory Visit: Admit: 2016-10-17 | Discharge: 2016-10-17 | Payer: MEDICAID | Primary: Adult Health

## 2016-10-17 ENCOUNTER — Inpatient Hospital Stay: Admit: 2016-10-17 | Discharge: 2016-10-19 | Disposition: A | Discharge status: Home / Self Care | Payer: MEDICAID

## 2016-10-17 DIAGNOSIS — E118 Type 2 diabetes mellitus with unspecified complications: ICD-10-CM

## 2016-10-17 DIAGNOSIS — O1490 Unspecified pre-eclampsia, unspecified trimester: ICD-10-CM

## 2016-10-17 DIAGNOSIS — E669 Obesity, unspecified: ICD-10-CM

## 2016-10-17 DIAGNOSIS — T814XXD Infection following a procedure, subsequent encounter: Principal | ICD-10-CM

## 2016-10-17 DIAGNOSIS — I10 Essential (primary) hypertension: ICD-10-CM

## 2016-10-17 DIAGNOSIS — E119 Type 2 diabetes mellitus without complications: Principal | ICD-10-CM

## 2016-10-17 DIAGNOSIS — O1495 Unspecified pre-eclampsia, complicating the puerperium: ICD-10-CM

## 2016-10-17 LAB — URINALYSIS, MICROSCOPIC

## 2016-10-17 LAB — COMPREHENSIVE METABOLIC PANEL
Lab: 134 MMOL/L — ABNORMAL LOW (ref 137–147)
Lab: 4.4 MMOL/L — ABNORMAL LOW (ref 3.5–5.1)

## 2016-10-17 LAB — POC GLUCOSE: Lab: 104 mg/dL — ABNORMAL HIGH (ref 70–100)

## 2016-10-17 LAB — URINALYSIS DIPSTICK
Lab: NEGATIVE U/L (ref 25–110)
Lab: NEGATIVE g/dL (ref 3.5–5.0)

## 2016-10-17 LAB — MAGNESIUM: Lab: 2 mg/dL (ref 1.6–2.6)

## 2016-10-17 LAB — CBC AND DIFF: Lab: 6 10*3/uL (ref 4.5–11.0)

## 2016-10-17 MED ORDER — LABETALOL 200 MG PO TAB
200 mg | Freq: Once | ORAL | 0 refills | Status: CP
Start: 2016-10-17 — End: ?
  Administered 2016-10-17: 17:00:00 200 mg via ORAL

## 2016-10-17 MED ORDER — IMS MIXTURE TEMPLATE
90 mg | Freq: Every evening | ORAL | 0 refills | Status: DC
Start: 2016-10-17 — End: 2016-10-19
  Administered 2016-10-18 – 2016-10-19 (×3): 90 mg via ORAL

## 2016-10-17 MED ORDER — METFORMIN 500 MG PO TAB
1000 mg | Freq: Two times a day (BID) | ORAL | 0 refills | Status: DC
Start: 2016-10-17 — End: 2016-10-19
  Administered 2016-10-19 (×2): 1000 mg via ORAL

## 2016-10-17 MED ORDER — SULFAMETHOXAZOLE-TRIMETHOPRIM 800-160 MG PO TAB
1 | ORAL_TABLET | Freq: Two times a day (BID) | ORAL | 0 refills | Status: SS
Start: 2016-10-17 — End: 2016-10-19

## 2016-10-17 MED ORDER — LACTATED RINGERS IV SOLP
INTRAVENOUS | 0 refills | Status: DC
Start: 2016-10-17 — End: 2016-10-19
  Administered 2016-10-18: 08:00:00 1000.000 mL via INTRAVENOUS

## 2016-10-17 MED ORDER — NIFEDIPINE 60 MG PO TR24
60 mg | Freq: Every evening | ORAL | 0 refills | Status: DC
Start: 2016-10-17 — End: 2016-10-17

## 2016-10-17 MED ORDER — IMS MIXTURE TEMPLATE
90 mg | Freq: Every evening | ORAL | 0 refills | Status: DC
Start: 2016-10-17 — End: 2016-10-18

## 2016-10-17 MED ORDER — ACETAMINOPHEN 500 MG PO TAB
1000 mg | Freq: Once | ORAL | 0 refills | Status: CP
Start: 2016-10-17 — End: ?
  Administered 2016-10-17: 19:00:00 1000 mg via ORAL

## 2016-10-17 MED ORDER — MAGNESIUM SULFATE IN WATER 4 GRAM/50 ML (8 %) IV PGBK
4 g | Freq: Once | INTRAVENOUS | 0 refills | Status: CP
Start: 2016-10-17 — End: ?

## 2016-10-17 MED ORDER — CYCLOBENZAPRINE 10 MG PO TAB
10 mg | Freq: Three times a day (TID) | ORAL | 0 refills | Status: DC | PRN
Start: 2016-10-17 — End: 2016-10-19
  Administered 2016-10-18: 02:00:00 10 mg via ORAL

## 2016-10-17 MED ORDER — MAGNESIUM SULFATE IN WATER 20 GRAM/500 ML (4 %) IV SOLP
1 g/h | INTRAVENOUS | 0 refills | Status: DC
Start: 2016-10-17 — End: 2016-10-19

## 2016-10-17 MED ORDER — LABETALOL 200 MG PO TAB
400 mg | ORAL | 0 refills | Status: DC
Start: 2016-10-17 — End: 2016-10-19
  Administered 2016-10-17 – 2016-10-19 (×6): 400 mg via ORAL

## 2016-10-17 MED ADMIN — MAGNESIUM SULFATE IN WATER 4 GRAM/50 ML (8 %) IV PGBK [166563]: 4 g | INTRAVENOUS | @ 18:00:00 | Stop: 2016-10-17 | NDC 63323010705

## 2016-10-17 MED ADMIN — MAGNESIUM SULFATE IN WATER 20 GRAM/500 ML (4 %) IV SOLP [166579]: 1 g/h | INTRAVENOUS | @ 19:00:00 | Stop: 2016-10-17 | NDC 00409672903

## 2016-10-17 MED ADMIN — LACTATED RINGERS IV SOLP [4318]: INTRAVENOUS | @ 18:00:00 | Stop: 2016-10-17 | NDC 00338011704

## 2016-10-17 MED FILL — SULFAMETHOXAZOLE-TRIMETHOPRIM 800-160 MG PO TAB: 160/800 mg | ORAL | 7 days supply | Qty: 14 | Status: CN

## 2016-10-17 NOTE — Case Management (ED)
Case Management Progress Note    NAME:Tonya Ortiz                          MRN: [id number]              DOB:March 17, 1980          AGE: 36 y.o.  ADMISSION DATE: 10/17/2016             DAYS ADMITTED: LOS: 0 days      Today???s Date: 10/17/2016    Plan  Pt, Tonya Ortiz, cell # [phone], is 17 days post c-section and was admitted to L&D Triage today from outpt wound care clinic with elevated blood pressures. She is to remain hospitalized on magnesium for treatment of superimposed preeclampsia.      The pt's newborn, Tonya Ortiz EMR # [id number] DOB 09/[birth year], is being admitted to the Peds Unit this PM as a social admit due to lack of family support to care for baby outside the hospital or in mother's OB room during her hospitalization.      If mother's medical condition deteriorates, OB team is to notify Peds Team and page on-call SW.    Baby will likely have to be placed in Police Protective Custody if mother is unable to participate in decision-making.  Baby not able to be d/c'd to anyone else without mother's consent.    SW available to assist further as needed.     Interventions  ? Support   Support: Pt/Family Updates re:POC or DC Plan, Counseling for Adaptation to Illness, Counseling for Psychosocial issues, Patient Education     SW paged to L&D by D. Love,RN, following admission of pt for treatment of preeclampsia .  [name] brought her 51 day old baby with her and states she has no one to care for baby during her hospitalization.  It was determined by L&D staff and SW that baby would not be safe if left alone with mother while hospitalized.  Mother is on magnesium, is unable to sit up/pick up baby at this time. She is not able to independently feed/change baby and is not able to be responsible as baby's caregiver .   Staff have already had to pick up baby to hand to her, had to change baby for mother and give a bottle.  It is very concerning that mother could have have a seizure or fall asleep and drop baby out of the bed while holding her when staff is not present.     SW and nursing staff inquired about having baby's father Perlie Gold Betania Dizon)  take baby home to [facility].  [name] stated that she is no longer with husband /FOB as he is living on the streets, looks like a homeless person and is on something (drugs). SW asked for his number and the pt states he doesn't have a phone.   Ms Erno states that paternal grandmother, [name] # [phone], lives in [place] but is not willing to come to assist with baby's care as she has to work.  Mother states she has asked a friend to assist with baby's care but the friend has 3 kids and has to work Advertising account executive. They told her if she missed another day she would be fired.   Mother insists that she has sent messages to other friends through church to see if anyone can come help, her but she is not hearing anything back.    SW explained  to mother that safety of her and baby is our priority. Therefore, a plan will need to be made to provide for baby's care before 7PM today. SW stated that staff would explore the possibility of baby being allowed to be admitted to the Peds Unit. SW explained that if that happened, mother would not be able to visit while on Mag, and baby would not be able to come to her due to staffing limitations. Mother verbalized understanding.  SW explained that if baby was not able to be admitted to Peds due to staffing or other concerns, the only other option left to hospital staff would be to place baby in police custody due to mother reporting that baby would not be safe if discharged to father.    SW and Nurse Mgr, L.Hay, spoke with administration, Peds staff ( Dr. Linus Mako) ,and it was agreed that baby would be admitted as a social admit until mother is medically ready for discharge or she is able to determine an appropriate caregiver outside the hospital.   Mother verbalized agreement.  SW updated Peds SW and NCM, L&D nursing staff and Peds Resident , Dr. Mable Paris.  Nurse Mgr, Thornell Sartorius , is assisting with transport of baby to Peds Unit when nursing staff are able to accept the new admit.   Peds resident to follow up with mother in the AM.     ? Info or Referral      ? Discharge Planning      ? Medication Needs      ? Financial      ? Legal      ? Other      Disposition  ? Expected Discharge Date    Expected Discharge Date: 10/20/16  ? Transportation   Does the patient need discharge transport arranged?: Yes  Does the patient use Medicaid Transportation?: Yes        ? Discharge Disposition               Case Management Admission Assessment    NAME:Tonya Ortiz                          MRN: [id number]             DOB:12-Feb-1980          AGE: 36 y.o.  ADMISSION DATE: 10/17/2016             DAYS ADMITTED: LOS: 0 days      Today???s Date: 10/17/2016    Source of Information: Pt, EMR, nursing staff       Plan  Plan: CM Assessment, Assist PRN with SW/NCM Services, Discharge Planning for Home Anticipated, Crisis Intervention    Patient Address/Phone  [address]  [place] [address]  [phone] (home)     Emergency Contact  Extended Emergency Contact Information  Primary Emergency Contact: Lawana Chambers States  Home Phone: [phone]  Relation: Friend    Network engineer Directive: No, patient does not have a healthcare directive  Would patient like to fill out a (a new) Healthcare Directive?: No, patient declined  Psych Advance Directive (Psych unit only): No, patient does not have a Social research officer, government  Does the patient need discharge transport arranged?: Yes  Does the patient use Medicaid Transportation?: Yes    Expected Discharge Date  Expected Discharge Date: 10/20/16    Living Situation Prior to Admission  ?  Living Arrangements  Type of Residence: Home, independent Living Arrangements: Child  -  Mother states she and baby live alone.  FOB is no longer living in the home .   Who provides assistance or could if needed?: no one identified at this time, mother states all her family lives in [place], estranged husband is not appropriate and his mother (paternal grandmother) is not agreeing to come to the hospital to assist with baby due to their work schedule.   Mother reports having only another friend who might assist and she has 3 children of her own and  will be fired if she misses work Advertising account executive.   Are they in good health?: Unknown  Can support system provide 24/7 care if needed?: No  ? Level of Function   Prior level of function: Independent  ? Cognitive Abilities   Cognitive Abilities: Alert and Oriented, Participates in decision making, Recognizes impact of health condition on lifestyle    Financial Resources    ? Coverage  Primary Insurance: Medicaid  Secondary Insurance: No insurance  Additional Coverage: RX    ? Source of Income   ? 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  Mother is separated from FOB.  ? Financial Assistance Needed?  no    Psychosocial Needs  ? Mental Health  Mental Health History: No (Per review of EMR)  ? Substance Use History     ? Other  no    Current/Previous Services  ? PCP  No Pcp, Na, None, None  ? Pharmacy    [facility] [address], [place] - [address] [place] [address] [place] [address]  [place] [address]  Phone: [phone] Fax: [phone]    [facility])  [phone] [address]  [place] [address]  Phone: [phone] Fax: [phone]    ? Durable Medical Equipment      ? Home Health     ? Hemodialysis or Peritoneal Dialysis     ? Tube/Enteral Feeds     ? Infusion     ? Private Duty     ? Home and [employer]     ? [facility]     ? Hospice     ? Outpatient Therapy     ? Skilled Nursing Facility/Nursing Home     ? Inpatient Rehab ? Long-Term Acute Care Hospital     ? Acute Hospital Stay       Elmer Bales LSCSW  NICU SW Case Manager  office [phone]  pgr [phone]

## 2016-10-17 NOTE — H&P (View-Only)
Labor/Delivery   Admission History and Physical Examination      Tonya Ortiz  Admission Date: 10/17/2016                     Assessment: 36 y.o. [id number] POD#17 PLTCS and IUD placement admitted for superimposed preeclampsia  cHTN  H/o of preeclampsia in last pregnancy  H/o 6 miscarriages  DM2 on insulin  BMI 65    Plan:  PreE labs  Monitor BP  200mg  PO Labetalol once  Admit to L&D  Magnesium 4&1 for seizure ppx  Continue Nifedipine 60mg  XL  Continue Metformin  POC glucose checks q4hr  LR 27mL/hr   Labetalol 400mg  TID  Mag check q4hr   BMP BID  Foley for strict I&O's     D/w Dr. Milinda Antis, , DO  PGY1, Emergency Medicine   __________________________________________________________________________________  Chief Complaint:  Elevated blood pressure    History of Present Illness: Tonya Ortiz is a 36 y.o. [phone] @ POD#17 PLTCS and IUD placement presents for elevated blood pressures from outpatient wound care clinic.  Patient reports she has had a HA for the past week refractory to the ibuprofen she's tried taking, but that it feels like her usual migraines.  Patient reports it is 6/10, frontal, and throbbing. Patient denies abdominal pain, F/C, N/V/D, vaginal bleeding, vaginal discharge.  Patient reports she has been compliant with her medications.  Patient also reports intermittent polydipsia.     Review of Systems:  A comprehensive review of systems was performed and was negative, except as in HPI    Prenatal Care:  [facility]    OB History:  OB History   Gravida Para Term Preterm AB Living   7 1 1   6 1    SAB TAB Ectopic Multiple Live Births   6     0 1      # Outcome Date GA Lbr Len/2nd Weight Sex Delivery Anes PTL Lv   7 Term 09/29/16 [redacted]w[redacted]d  3480 g (7 lb 10.8 oz) F CS-LTranv CSE N LIV      Complications: Pre-eclampsia added to pre-existing hypertension,Chronic hypertension,Type 2 diabetes mellitus complicating pregnancy, antepartum, third trimester,Obesities, morbid (HCC) 6 SAB            5 SAB            4 SAB            3 SAB            2 SAB            1 SAB                                   Gyn History:    Pap:  Positive for LSIL   STD:  Negative     PmHx:   Past Medical History:   Diagnosis Date   ??? Diabetes (HCC) 09/07/2014   ??? Hypertension    ??? Obesity 09/07/2014       PsHx:   Past Surgical History:   Procedure Laterality Date   ??? CESAREAN SECTION Bilateral 09/29/2016    CESAREAN SECTION performed by Myrtie Hawk, MD at L&D         FmHx:   Family History   Problem Relation Age of Onset   ??? Hypertension Mother    ??? Hypertension Father    ??? High  Cholesterol Father    ??? Hypertension Paternal Grandmother    ??? Diabetes Paternal Grandmother        Social:   Denies tobacco, EtOH, drug use    Allergies:  Latex    Medications:  No current facility-administered medications on file prior to encounter.      Current Outpatient Prescriptions on File Prior to Encounter   Medication Sig Dispense Refill   ??? aspirin 325 mg tablet Take 325 mg by mouth daily. Take with food.     ??? cyclobenzaprine (FLEXERIL) 10 mg tablet Take 1 tablet by mouth three times daily as needed for Muscle Cramps. 30 tablet 2   ??? docusate (COLACE) 100 mg capsule Take 200 mg by mouth daily.     ??? hydrOXYzine pamoate (VISTARIL) 50 mg capsule Take 50 mg by mouth every 6 hours as needed for Itching.     ??? ibuprofen (MOTRIN) 600 mg tablet Take one tablet by mouth every 6 hours. Take with food. 30 tablet 0   ??? labetalol (NORMODYNE) 200 mg tablet Take one tablet by mouth three times daily for 30 days. 90 tablet 0   ??? metFORMIN (GLUCOPHAGE) 1,000 mg tablet Take one tablet by mouth twice daily with meals. 180 tablet 1   ??? NIFEdipine XL (PROCARDIA-XL) 60 mg tablet Take one tablet by mouth at bedtime daily. 30 tablet 1   ??? oxyCODONE (ROXICODONE, OXY-IR) 5 mg tablet Take 1-2 tablets by mouth every 4 hours as needed 30 tablet 0   ??? PRENATAL VIT CALC,IRON,FOLIC (PRENATAL VITAMIN PO) Take  by mouth. ??? simethicone (MYLICON) 80 mg chew tablet Chew one tablet by mouth every 6 hours as needed for Flatulence. 30 tablet 0   ??? trimethoprim/sulfamethoxazole (BACTRIM DS) 160/800 mg tablet Take one tablet by mouth twice daily. 14 tablet 0         Physical Exam:  Vital Signs: Last Filed In 24 Hours                Vital Signs: 24 Hour Range   BP: 160/110 (09/28 1245)  Pulse: 101 (09/28 1245)  SpO2: 99 % (09/28 1244)  SpO2 Pulse: 101 (09/28 1244)  Height: 167.6 cm (66) (09/28 0837) BP: (152-183)/(86-122)   Pulse:  [83-101]   SpO2:  [98 %-100 %]    Intensity Pain Scale (Self Report): (not recorded)      Gen: NAD  CV: Regular rate  Chest: Unlabored breathing  Abd: Gravid, nttp  Ext: No LE edema, nttp LE      Lab/Radiology/Other Diagnostic Tests:  A POS, Ab-, Pap LSIL, RI, HIV-, HBsAg-, Syph AB-, GC/CT -/-    24-hour labs:    Results for orders placed or performed during the hospital encounter of 10/17/16 (from the past 24 hour(s))   CBC AND DIFF    Collection Time: 10/17/16 11:21 AM   Result Value Ref Range    White Blood Cells 6.0 4.5 - 11.0 K/UL    RBC 3.61 (L) 4.0 - 5.0 M/UL    Hemoglobin 10.8 (L) 12.0 - 15.0 GM/DL    Hematocrit 13.0 (L) 36 - 45 %    MCV 88.8 80 - 100 FL    MCH 30.0 26 - 34 PG    MCHC 33.8 32.0 - 36.0 G/DL    RDW 86.5 11 - 15 %    Platelet Count 364 150 - 400 K/UL    MPV 7.7 7 - 11 FL    Neutrophils 52 41 - 77 %  Lymphocytes 39 24 - 44 %    Monocytes 7 4 - 12 %    Eosinophils 1 0 - 5 %    Basophils 1 0 - 2 %    Absolute Neutrophil Count 3.10 1.8 - 7.0 K/UL    Absolute Lymph Count 2.30 1.0 - 4.8 K/UL    Absolute Monocyte Count 0.40 0 - 0.80 K/UL    Absolute Eosinophil Count 0.10 0 - 0.45 K/UL    Absolute Basophil Count 0.00 0 - 0.20 K/UL   COMPREHENSIVE METABOLIC PANEL    Collection Time: 10/17/16 11:21 AM   Result Value Ref Range    Sodium 134 (L) 137 - 147 MMOL/L    Potassium 4.4 3.5 - 5.1 MMOL/L    Chloride 100 98 - 110 MMOL/L    Glucose 96 70 - 100 MG/DL    Blood Urea Nitrogen 22 7 - 25 MG/DL Creatinine 1.61 (H) 0.4 - 1.00 MG/DL    Calcium 9.5 8.5 - 09.6 MG/DL    Total Protein 7.9 6.0 - 8.0 G/DL    Total Bilirubin 0.3 0.3 - 1.2 MG/DL    Albumin 4.1 3.5 - 5.0 G/DL    Alk Phosphatase 86 25 - 110 U/L    AST (SGOT) 12 7 - 40 U/L    CO2 23 21 - 30 MMOL/L    ALT (SGPT) 9 7 - 56 U/L    Anion Gap 11 3 - 12    eGFR Non African American 41 (L) >60 mL/min    eGFR African American 50 (L) >60 mL/min   URINALYSIS DIPSTICK    Collection Time: 10/17/16 11:27 AM   Result Value Ref Range    Color,UA YELLOW     Turbidity,UA CLEAR CLEAR-CLEAR    Specific Gravity-Urine 1.015 1.003 - 1.035    pH,UA 5.0 5.0 - 8.0    Protein,UA NEG NEG-NEG    Glucose,UA NEG NEG-NEG    Ketones,UA NEG NEG-NEG    Bilirubin,UA NEG NEG-NEG    Blood,UA NEG NEG-NEG    Urobilinogen,UA NORMAL NORM-NORMAL    Nitrite,UA NEG NEG-NEG    Leukocytes,UA NEG NEG-NEG    Urine Ascorbic Acid, UA NEG NEG-NEG   URINALYSIS, MICROSCOPIC    Collection Time: 10/17/16 11:27 AM   Result Value Ref Range    WBCs,UA 0-2 0 - 2 /HPF    RBCs,UA 0-2 0 - 3 /HPF    MucousUA TRACE     Squamous Epithelial Cells 2-5 0 - 5       Point of Care Testing:  Glucose: 96 (10/17/16 1121)

## 2016-10-17 NOTE — Progress Notes
Positive MOEWS score  Parameters that triggered a positive result: systolic greater that 170   Time discovered: Throughout traige stay  Time physician notified: Dr. Belia Heman, Dr. Betti Cruz   Time physician at the bedside: Dr. Betti Cruz and Dr. Belia Heman   Interventions: Admitted and started [facility]

## 2016-10-17 NOTE — Progress Notes
Date of Service: 10/17/2016    Subjective:             Tonya Ortiz is a 36 y.o. female.    History of Present Illness    Tonya Ortiz is a 36 y.o. [phone] here today for incision check 2 wks s/p PLTCS 09/29/16 & placement Mirena IUD. She has [name] with her today.  Pt. states she completed Bactrim DS this a.m..  Caused generalized mild itching.  No rash.  Was told to continue antibiotics.  Has changed DSD/interdry daily after showering/uses soap.  States she feels better.  Wound under panniculus is slightly tender near midpoint. Has small amount of bloody discharge with slight odor.  Has not checked temp.  No vaginal bleeding or discharge.  Denies pelvic pain or urinary symptoms.    Taking all meds as directed/listed below.       Review of Systems   Constitutional: Negative for fatigue, fever and unexpected weight change.   HENT: Negative for voice change.    Respiratory: Negative for cough and shortness of breath.    Cardiovascular: Negative for chest pain and leg swelling.   Gastrointestinal: Negative for abdominal pain, blood in stool, constipation, diarrhea, nausea and vomiting.   Genitourinary: Negative for difficulty urinating, dyspareunia, dysuria, enuresis, frequency, genital sores, hematuria, menstrual problem, pelvic pain, urgency, vaginal bleeding, vaginal discharge and vaginal pain.   Musculoskeletal: Negative for arthralgias and back pain.   Skin: Negative for rash.   Neurological: Negative for light-headedness and headaches.   Hematological: Negative for adenopathy. Does not bruise/bleed easily.   Psychiatric/Behavioral: Negative for confusion. The patient is not nervous/anxious.      Past Medical History:   Diagnosis Date   ??? Diabetes (HCC) 09/07/2014   ??? Hypertension    ??? Obesity 09/07/2014     Past Surgical History:   Procedure Laterality Date   ??? CESAREAN SECTION Bilateral 09/29/2016    CESAREAN SECTION performed by Myrtie Hawk, MD at L&D Family/social history unchanged.    Objective:         ??? aspirin 325 mg tablet Take 325 mg by mouth daily. Take with food.   ??? cyclobenzaprine (FLEXERIL) 10 mg tablet Take 1 tablet by mouth three times daily as needed for Muscle Cramps.   ??? docusate (COLACE) 100 mg capsule Take 200 mg by mouth daily.   ??? hydrOXYzine pamoate (VISTARIL) 50 mg capsule Take 50 mg by mouth every 6 hours as needed for Itching.   ??? ibuprofen (MOTRIN) 600 mg tablet Take one tablet by mouth every 6 hours. Take with food.   ??? labetalol (NORMODYNE) 200 mg tablet Take one tablet by mouth three times daily for 30 days.   ??? metFORMIN (GLUCOPHAGE) 1,000 mg tablet Take one tablet by mouth twice daily with meals.   ??? NIFEdipine XL (PROCARDIA-XL) 60 mg tablet Take one tablet by mouth at bedtime daily.   ??? oxyCODONE (ROXICODONE, OXY-IR) 5 mg tablet Take 1-2 tablets by mouth every 4 hours as needed   ??? PRENATAL VIT CALC,IRON,FOLIC (PRENATAL VITAMIN PO) Take  by mouth.   ??? simethicone (MYLICON) 80 mg chew tablet Chew one tablet by mouth every 6 hours as needed for Flatulence.     Vitals:    10/17/16 0837   Weight: (!) 156.4 kg (344 lb 12.8 oz)   Height: 167.6 cm (66)     Body mass index is 55.65 kg/m???.     Physical Exam   Constitutional: She is oriented  to person, place, and time. She appears well-developed and well-nourished. No distress.   Morbidly obese.  Cooperative, pleasant.     HENT:   Head: Normocephalic and atraumatic.   Neck: No thyromegaly present.   Cardiovascular: Normal rate.    Pulmonary/Chest: Effort normal. No respiratory distress.   Abdominal: Soft. She exhibits no mass. There is no tenderness.   Slightly yellowed strip of DSD removed from under panniculus, malodorous.  Minimal discharge.   Wound is well healed except for a 5mm area of incisional separation to right of midline.  Slightly tender.  Minimal induration.  Unable to probe.  Small amount of blood expressed.  Clean ABD pads placed under fold.   Genitourinary: Genitourinary Comments: Pelvic/bimanual exam not performed   Neurological: She is alert and oriented to person, place, and time.   Skin: Skin is warm and dry.   Psychiatric: She has a normal mood and affect. Her behavior is normal. Thought content normal.            Assessment and Plan:  36 y.o. [id number] morbidly obese woman here for incision check 2 weeks after PLTCS with Mirena placement.   Wound healing.  Resolving incisional infection.  Continued concern for hygiene & body habitus.     --Keep wound clean dry with dressing change daily after showering.  Call prn increased pain, fever, or discharge.  --Continue Bactrim DS x  7 more days.  Rx sent.  Prefer to continue for MRSA coverage.  Call prn should generalized itching increase or rash develop.  --Uncontrolled Hypertension--Discussed with pt.  Need to be seen/evaluated @ ED.  Pt. agrees.  Will transport.    Discussed in detail with Dr. Albesa Seen.    Queen Blossom, PA-C

## 2016-10-18 ENCOUNTER — Encounter: Admit: 2016-10-18 | Discharge: 2016-10-18 | Primary: Adult Health

## 2016-10-18 LAB — MAGNESIUM
Lab: 3.8 mg/dL — ABNORMAL HIGH (ref 1.6–2.6)
Lab: 4.4 mg/dL — ABNORMAL HIGH (ref 1.6–2.6)
Lab: 4.7 mg/dL — ABNORMAL HIGH (ref 1.6–2.6)
Lab: 4.8 mg/dL — ABNORMAL HIGH (ref 1.6–2.6)

## 2016-10-18 LAB — COMPREHENSIVE METABOLIC PANEL
Lab: 136 MMOL/L — ABNORMAL LOW (ref 137–147)
Lab: 49 mL/min — ABNORMAL LOW (ref >60)
Lab: 59 mL/min — ABNORMAL LOW (ref >60)
Lab: 0.3 mg/dL (ref 0.3–1.2)
Lab: 1.2 mg/dL — ABNORMAL HIGH (ref 0.4–1.00)
Lab: 102 MMOL/L (ref 98–110)
Lab: 120 mg/dL — ABNORMAL HIGH (ref 70–100)
Lab: 13 U/L (ref 7–56)
Lab: 136 MMOL/L — ABNORMAL LOW (ref 137–147)
Lab: 16 U/L (ref 7–40)
Lab: 18 mg/dL (ref 7–25)
Lab: 26 MMOL/L (ref 21–30)
Lab: 3.7 g/dL (ref 3.5–5.0)
Lab: 4.3 MMOL/L (ref 3.5–5.1)
Lab: 48 mL/min — ABNORMAL LOW (ref >60)
Lab: 58 mL/min — ABNORMAL LOW (ref >60)
Lab: 6.8 g/dL (ref 6.0–8.0)
Lab: 74 U/L (ref 25–110)
Lab: 8.6 mg/dL (ref 8.5–10.6)
Lab: 8 (ref 3–12)

## 2016-10-18 LAB — CBC
Lab: 12 % (ref 11–15)
Lab: 2.9 M/UL — ABNORMAL LOW (ref 4.0–5.0)
Lab: 226 10*3/uL (ref 150–400)
Lab: 26 % — ABNORMAL LOW (ref 36–45)
Lab: 29 pg (ref 26–34)
Lab: 32 g/dL (ref 32.0–36.0)
Lab: 5.5 10*3/uL (ref 4.5–11.0)
Lab: 7.6 FL (ref 7–11)
Lab: 8.7 g/dL — ABNORMAL LOW (ref 12.0–15.0)
Lab: 90 FL (ref 80–100)

## 2016-10-18 LAB — CULTURE-URINE W/SENSITIVITY

## 2016-10-18 LAB — BASIC METABOLIC PANEL
Lab: 135 MMOL/L — ABNORMAL LOW (ref 137–147)
Lab: 22 mg/dL (ref 7–25)
Lab: 24 MMOL/L (ref 21–30)
Lab: 4.2 MMOL/L (ref 3.5–5.1)
Lab: 56 mL/min — ABNORMAL LOW (ref >60)
Lab: 9 (ref 3–12)
Lab: 9.1 mg/dL (ref 8.5–10.6)
Lab: 93 mg/dL (ref 70–100)

## 2016-10-18 LAB — POC GLUCOSE
Lab: 102 mg/dL — ABNORMAL HIGH (ref 70–100)
Lab: 124 mg/dL — ABNORMAL HIGH (ref 70–100)
Lab: 104 mg/dL — ABNORMAL HIGH (ref 70–100)
Lab: 178 mg/dL — ABNORMAL HIGH (ref 70–100)

## 2016-10-18 MED ORDER — ACETAMINOPHEN 325 MG PO TAB
650 mg | ORAL | 0 refills | Status: DC | PRN
Start: 2016-10-18 — End: 2016-10-19
  Administered 2016-10-19: 02:00:00 650 mg via ORAL

## 2016-10-18 NOTE — Progress Notes
IVT for labs

## 2016-10-18 NOTE — Med Student Progress Note
OB Postpartum Magnesium Note    Subjective:    Patient reports a HA, 10/10, which was present before starting magnesium but has gotten worse overnight.  Denies chest pain, shortness of breath, blurry vision, abdominal pain.        Objective:    Physical Exam:    Vitals:  Vitals:    10/17/16 2100 10/17/16 2101 10/17/16 2115 10/17/16 2315   BP:  154/89 (!) 150/91 139/88   Pulse:  81 80 83   Temp:   36.4 C (97.6 F) 36.7 C (98.1 F)   SpO2: 99%  100% 99%   Weight:       Height:           General:  No acute distress. Patient asleep upon entering room.  Heart:  Regular rate and rhythm.  Lungs: Clear to auscultation bilaterally.  Abdomen:  Soft, non-tender to palpation. Non-distended.   Extremities: No edema BL LE.  DTR 1+ upper and lower extremities.        Lab Review:  Urine Output over 24hrs: 2150 ml  UO over last 3 hours: 475 ml, 300 ml, 150 ml  BUN 22, Cr 1.32      Assessment:    36 y.o. G7 [id number] POD 17 s/c C-section, with elevated pressures. Exam reveals no signs suggestive of Mg toxicity.      Plan:    1.Continue magnesium infusion @1g /hr, recheck for signs of Mg toxicity at 0400.  2. Continue NPO, strict I/O, IVF, BP checks, preeclamptic labs    D/w Dr. Rayvon Char.      .me

## 2016-10-18 NOTE — Progress Notes
OB  Progress Note      Hospital Day 2       Subjective     Complains of mild headache but she does have headaches often for which she takes oxycodone. No blurry vision or epigastric pain.          Objective     Patient Vitals for the past 24 hrs:   BP Temp Pulse SpO2 Height Weight   10/18/16 0903 128/78 36.3 ???C (97.3 ???F) 89 97 % - -   10/18/16 0758 128/75 36.4 ???C (97.5 ???F) 90 97 % - -   10/18/16 0722 123/75 36.5 ???C (97.7 ???F) 95 98 % - -   10/18/16 0515 125/73 36.8 ???C (98.2 ???F) 86 97 % - -   10/18/16 0315 131/76 36.6 ???C (97.9 ???F) 93 95 % - -   10/18/16 0215 - - 86 - - -   10/18/16 0115 135/77 36.5 ???C (97.7 ???F) 84 98 % - -   10/17/16 2315 139/88 36.7 ???C (98.1 ???F) 83 99 % - -   10/17/16 2115 (!) 150/91 36.4 ???C (97.6 ???F) 80 100 % - -   10/17/16 2101 154/89 - 81 - - -   10/17/16 2100 - - - 99 % - -   10/17/16 2050 - - - 99 % - -   10/17/16 2046 (!) 157/98 - 71 - - -   10/17/16 2045 - - - 100 % - -   10/17/16 2040 - - - 99 % - -   10/17/16 2035 - - - 98 % - -   10/17/16 2031 (!) 155/96 - 78 - - -   10/17/16 2030 - - - 100 % - -   10/17/16 2001 (!) 152/96 - 79 - - -   10/17/16 2000 - - - 100 % - -   10/17/16 1955 - - - 99 % - -   10/17/16 1950 - - - 100 % - -   10/17/16 1946 (!) 154/96 - 80 - - -   10/17/16 1945 - - - 99 % - -   10/17/16 1940 - - - 100 % - -   10/17/16 1935 - - - 99 % - -   10/17/16 1931 (!) 152/94 - 80 - - -   10/17/16 1930 - 36.6 ???C (97.9 ???F) - 99 % - -   10/17/16 1925 - - - 99 % - -   10/17/16 1920 - - - 98 % - -   10/17/16 1915 (!) 147/93 - 77 - - -   10/17/16 1914 - - - 98 % - -   10/17/16 1900 148/83 - 76 - - -   10/17/16 1859 - - - 99 % - -   10/17/16 1845 (!) 155/101 - 85 - - -   10/17/16 1844 - - - 100 % - -   10/17/16 1830 (!) 160/103 - 87 - - -   10/17/16 1829 - - - 99 % - -   10/17/16 1815 155/90 - 75 - - -   10/17/16 1814 - - - 98 % - -   10/17/16 1800 (!) 159/103 - 82 - - -   10/17/16 1759 - - - 99 % - -   10/17/16 1750 - - - 99 % - -   10/17/16 1746 (!) 153/105 - 86 - - - 10/17/16 1745 - - - 99 % - -   10/17/16 1731 (!) 174/98 - 80 - - -  10/17/16 1730 - - - 99 % - -   10/17/16 1715 (!) 170/102 - 70 - - -   10/17/16 1714 - - - 99 % - -   10/17/16 1700 (!) 162/101 - 78 - - -   10/17/16 1659 - - - 99 % - -   10/17/16 1645 (!) 160/91 - 79 - - -   10/17/16 1644 - - - 99 % - -   10/17/16 1635 - - - 98 % - -   10/17/16 1631 161/84 - 77 - - -   10/17/16 1630 - 36.9 ???C (98.4 ???F) - 97 % - -   10/17/16 1625 - - - 98 % - -   10/17/16 1620 - - - 97 % - -   10/17/16 1616 163/90 - 76 - - -   10/17/16 1615 - - - 97 % - -   10/17/16 1600 (!) 156/95 - 84 - - -   10/17/16 1559 - - - 99 % - -   10/17/16 1550 - - - 98 % - -   10/17/16 1546 (!) 141/103 - 94 - - -   10/17/16 1545 - - - 99 % - -   10/17/16 1531 (!) 137/99 - 84 - - -   10/17/16 1530 - - - 98 % - -   10/17/16 1525 - - - 98 % - -   10/17/16 1520 - - - 99 % - -   10/17/16 1516 105/82 - 91 - - -   10/17/16 1500 149/82 - 88 - - -   10/17/16 1459 - - - 98 % - -   10/17/16 1446 145/79 - 88 - - -   10/17/16 1444 - - - 99 % - -   10/17/16 1416 (!) 150/94 - 85 - - -   10/17/16 1415 - - - 97 % - -   10/17/16 1410 - - - 99 % - -   10/17/16 1405 - - - 96 % - -   10/17/16 1401 152/90 - 81 - - -   10/17/16 1350 - - - - 167.6 cm (66) (!) 155.6 kg (343 lb)   10/17/16 1346 (!) 157/94 - 83 - - -   10/17/16 1345 - - - 95 % - -   10/17/16 1335 - - - 98 % - -   10/17/16 1331 145/78 - 92 - - -   10/17/16 1316 152/80 - 91 - - -   10/17/16 1315 - 36.7 ???C (98.1 ???F) - 98 % - -   10/17/16 1310 - - - 99 % - -   10/17/16 1301 146/86 - 99 - - -   10/17/16 1245 (!) 160/110 - 101 - - -   10/17/16 1244 - - - 99 % - -   10/17/16 1230 (!) 165/96 - 93 - - -   10/17/16 1229 - - - 99 % - -   10/17/16 1150 - - - 100 % - -   10/17/16 1146 (!) 183/107 - 83 - - -   10/17/16 1145 - - - 98 % - -   10/17/16 1135 - - - 99 % - -   10/17/16 1131 (!) 171/92 - 87 - - -   10/17/16 1130 - - - 99 % - -   10/17/16 1101 152/86 - 89 - - -   10/17/16 1100 - - - 99 % - - 10/17/16 1050 - - - 99 % - -  10/17/16 1045 (!) 153/110 - 89 - - -   10/17/16 1044 - - - 99 % - -         Physical Exam:  General:  No acute distress.  Heart:  Regular rate and rhythm.  Lungs: Clear to auscultation bilaterally.  Abdomen:  Soft, non-tender to palpation. Non-distended. Positive bowel sounds.  Extremities: No edema BL LE.  Negative Homan's sign bilaterally.      Lab Review:  24-hour labs:    Results for orders placed or performed during the hospital encounter of 10/17/16 (from the past 24 hour(s))   CBC AND DIFF    Collection Time: 10/17/16 11:21 AM   Result Value Ref Range    White Blood Cells 6.0 4.5 - 11.0 K/UL    RBC 3.61 (L) 4.0 - 5.0 M/UL    Hemoglobin 10.8 (L) 12.0 - 15.0 GM/DL    Hematocrit 16.1 (L) 36 - 45 %    MCV 88.8 80 - 100 FL    MCH 30.0 26 - 34 PG    MCHC 33.8 32.0 - 36.0 G/DL    RDW 09.6 11 - 15 %    Platelet Count 364 150 - 400 K/UL    MPV 7.7 7 - 11 FL    Neutrophils 52 41 - 77 %    Lymphocytes 39 24 - 44 %    Monocytes 7 4 - 12 %    Eosinophils 1 0 - 5 %    Basophils 1 0 - 2 %    Absolute Neutrophil Count 3.10 1.8 - 7.0 K/UL    Absolute Lymph Count 2.30 1.0 - 4.8 K/UL    Absolute Monocyte Count 0.40 0 - 0.80 K/UL    Absolute Eosinophil Count 0.10 0 - 0.45 K/UL    Absolute Basophil Count 0.00 0 - 0.20 K/UL   COMPREHENSIVE METABOLIC PANEL    Collection Time: 10/17/16 11:21 AM   Result Value Ref Range    Sodium 134 (L) 137 - 147 MMOL/L    Potassium 4.4 3.5 - 5.1 MMOL/L    Chloride 100 98 - 110 MMOL/L    Glucose 96 70 - 100 MG/DL    Blood Urea Nitrogen 22 7 - 25 MG/DL    Creatinine 0.45 (H) 0.4 - 1.00 MG/DL    Calcium 9.5 8.5 - 40.9 MG/DL    Total Protein 7.9 6.0 - 8.0 G/DL    Total Bilirubin 0.3 0.3 - 1.2 MG/DL    Albumin 4.1 3.5 - 5.0 G/DL    Alk Phosphatase 86 25 - 110 U/L    AST (SGOT) 12 7 - 40 U/L    CO2 23 21 - 30 MMOL/L    ALT (SGPT) 9 7 - 56 U/L    Anion Gap 11 3 - 12    eGFR Non African American 41 (L) >60 mL/min    eGFR African American 50 (L) >60 mL/min   MAGNESIUM Collection Time: 10/17/16 11:21 AM   Result Value Ref Range    Magnesium 2.0 1.6 - 2.6 mg/dL   URINALYSIS DIPSTICK    Collection Time: 10/17/16 11:27 AM   Result Value Ref Range    Color,UA YELLOW     Turbidity,UA CLEAR CLEAR-CLEAR    Specific Gravity-Urine 1.015 1.003 - 1.035    pH,UA 5.0 5.0 - 8.0    Protein,UA NEG NEG-NEG    Glucose,UA NEG NEG-NEG    Ketones,UA NEG NEG-NEG    Bilirubin,UA NEG NEG-NEG    Blood,UA NEG NEG-NEG  Urobilinogen,UA NORMAL NORM-NORMAL    Nitrite,UA NEG NEG-NEG    Leukocytes,UA NEG NEG-NEG    Urine Ascorbic Acid, UA NEG NEG-NEG   URINALYSIS, MICROSCOPIC    Collection Time: 10/17/16 11:27 AM   Result Value Ref Range    WBCs,UA 0-2 0 - 2 /HPF    RBCs,UA 0-2 0 - 3 /HPF    MucousUA TRACE     Squamous Epithelial Cells 2-5 0 - 5   CULTURE-URINE W/SENSITIVITY    Collection Time: 10/17/16 11:30 AM   Result Value Ref Range    Battery Name URINE CULTURE     Specimen Description URINE, STRAIGHT CATH     Special Requests NONE     Culture NO GROWTH     Report Status FINAL  10/18/2016      POC GLUCOSE    Collection Time: 10/17/16  5:24 PM   Result Value Ref Range    Glucose, POC 104 (H) 70 - 100 MG/DL   BASIC METABOLIC PANEL    Collection Time: 10/17/16  6:22 PM   Result Value Ref Range    Sodium 135 (L) 137 - 147 MMOL/L    Potassium 4.2 3.5 - 5.1 MMOL/L    Chloride 102 98 - 110 MMOL/L    CO2 24 21 - 30 MMOL/L    Anion Gap 9 3 - 12    Glucose 93 70 - 100 MG/DL    Blood Urea Nitrogen 22 7 - 25 MG/DL    Creatinine 9.56 (H) 0.4 - 1.00 MG/DL    Calcium 9.1 8.5 - 21.3 MG/DL    eGFR Non African American 46 (L) >60 mL/min    eGFR African American 56 (L) >60 mL/min   MAGNESIUM    Collection Time: 10/17/16  6:22 PM   Result Value Ref Range    Magnesium 3.8 (H) 1.6 - 2.6 mg/dL   POC GLUCOSE    Collection Time: 10/18/16 12:09 AM   Result Value Ref Range    Glucose, POC 102 (H) 70 - 100 MG/DL   MAGNESIUM    Collection Time: 10/18/16 12:49 AM   Result Value Ref Range    Magnesium 4.4 (H) 1.6 - 2.6 mg/dL POC GLUCOSE    Collection Time: 10/18/16  4:20 AM   Result Value Ref Range    Glucose, POC 124 (H) 70 - 100 MG/DL   MAGNESIUM    Collection Time: 10/18/16  5:27 AM   Result Value Ref Range    Magnesium 4.7 (H) 1.6 - 2.6 mg/dL   COMPREHENSIVE METABOLIC PANEL    Collection Time: 10/18/16  5:27 AM   Result Value Ref Range    Sodium 136 (L) 137 - 147 MMOL/L    Potassium 4.3 3.5 - 5.1 MMOL/L    Chloride 102 98 - 110 MMOL/L    Glucose 120 (H) 70 - 100 MG/DL    Blood Urea Nitrogen 18 7 - 25 MG/DL    Creatinine 0.86 (H) 0.4 - 1.00 MG/DL    Calcium 8.6 8.5 - 57.8 MG/DL    Total Protein 6.8 6.0 - 8.0 G/DL    Total Bilirubin 0.3 0.3 - 1.2 MG/DL    Albumin 3.7 3.5 - 5.0 G/DL    Alk Phosphatase 74 25 - 110 U/L    AST (SGOT) 16 7 - 40 U/L    CO2 26 21 - 30 MMOL/L    ALT (SGPT) 13 7 - 56 U/L    Anion Gap 8 3 - 12  eGFR Non African American 48 (L) >60 mL/min    eGFR African American 58 (L) >60 mL/min   COMPREHENSIVE METABOLIC PANEL    Collection Time: 10/18/16  5:42 AM   Result Value Ref Range    Sodium 136 (L) 137 - 147 MMOL/L    Potassium 4.4 3.5 - 5.1 MMOL/L    Chloride 103 98 - 110 MMOL/L    Glucose 119 (H) 70 - 100 MG/DL    Blood Urea Nitrogen 19 7 - 25 MG/DL    Creatinine 1.91 (H) 0.4 - 1.00 MG/DL    Calcium 8.7 8.5 - 47.8 MG/DL    Total Protein 6.9 6.0 - 8.0 G/DL    Total Bilirubin 0.3 0.3 - 1.2 MG/DL    Albumin 3.5 3.5 - 5.0 G/DL    Alk Phosphatase 76 25 - 110 U/L    AST (SGOT) 15 7 - 40 U/L    CO2 24 21 - 30 MMOL/L    ALT (SGPT) 10 7 - 56 U/L    Anion Gap 9 3 - 12    eGFR Non African American 49 (L) >60 mL/min    eGFR African American 59 (L) >60 mL/min       Point of Care Testing:  (Last 24 hours):  Glucose: (!) 119 (10/18/16 0542)  POC Glucose (Download): (!) 124 (10/18/16 0420)           Assessment     35yo [id number] s/p PLTCS 9/10 admitted for superimposed preeclampsia    Comorbids: cHTN, DMII           Plan     Continue magnesium for 24 hours.  Discontinue at noon today BP controlled with labetalol 400 q 8 hrs and nifedipine XL 90 nightly  AM labs: plt 364, Cr 1.26 (improved from 1.45-->1.32)  UO- ~150 cc/hr  Magnesium q 4 hours   Diabetic diet  Continue metformin 1000 mg BID  POC glucose    D/W Dr. Joella Prince, MD  [phone]

## 2016-10-18 NOTE — Med Student Progress Note
OB Postpartum Magnesium Note    Subjective:    Patient continues to have HA, 5/10, but states that is is much better than what is was earlier in the night.  Denies chest pain, shortness of breath, blurry vision, abdominal pain.      Objective:    Physical Exam:    Vitals:  Vitals:    10/18/16 0115 10/18/16 0215 10/18/16 0315 10/18/16 0515   BP: 135/77  131/76 125/73   Pulse: 84 86 93 86   Temp: 36.5 C (97.7 F)  36.6 C (97.9 F) 36.8 C (98.2 F)   SpO2: 98%  95% 97%   Weight:       Height:           General:  No acute distress.  Heart:  Regular rate and rhythm.  Lungs: Clear to auscultation bilaterally.  Abdomen:  Soft, non-tender to palpation. Non-distended.   Extremities: no edema BL LE.  DTR +2/4 upper and lower extremities.        Lab Review:  Urine output over last 24hrs: 3475 ml  UO over last 3 hours: 250 ml, 175 ml, 150 ml  BUN 19, Cr 1.26    Assessment:    35 y.o. G7 P1061 POD#17, with improving HA and BPs trending in the 120s/70s. Exam unremarkable for signs of Mg toxicity.    Plan:    1.Continue magnesium infusion 1g/hr, re-check for signs of Mg toxicity at 11:30.  2. Continue NPO, strict I/O, IVF, BP checks, preeclamptic labs.  3. Magnesium off at 1300.    D/w Dr. Albertina Senegal      .me

## 2016-10-18 NOTE — Med Student Progress Note
OB Postpartum Magnesium Note    Subjective:    Patient states she continues to have HA, but it is "not nearly as bad as was" at previous check.  Denies chest pain, shortness of breath, headache, blurry vision, abdominal pain.      Objective:    Physical Exam:    Vitals:  Vitals:    10/17/16 2315 10/18/16 0115 10/18/16 0215 10/18/16 0315   BP: 139/88 135/77  131/76   Pulse: 83 84 86 93   Temp: 36.7 C (98.1 F) 36.5 C (97.7 F)  36.6 C (97.9 F)   SpO2: 99% 98%  95%   Weight:       Height:         General:  No acute distress.  Heart:  Regular rate and rhythm.  Lungs: Clear to auscultation bilaterally.  Abdomen:  Soft, non-tender to palpation. Non-distended.   Extremities: no edema BL LE.  DTR 1+ upper and lower extremities.    Lab Review:  Urine output over 24hrs: 3150  UO over last 3 hours: 400 ml, 350 ml, 0 ml  BUN 22, Cr 1.32      Assessment:    36 y.o. G7 P1061 POD#17 s/p Cesarean section with improving headache. Exam findings unremarkable for signs of Mg toxicity. Good urine output and BPs trending in the 130s/70s.    Plan:    1.Continue magnesium infusion @1g /hr. Recheck for signs of Mg toxicity at 0730.  2. Continue NPO, strict I/O, IVF, BP checks, preeclamptic labs.    D/w Dr. Albertina Senegal,    .me

## 2016-10-19 ENCOUNTER — Encounter: Admit: 2016-10-19 | Discharge: 2016-10-19 | Primary: Adult Health

## 2016-10-19 DIAGNOSIS — O99215 Obesity complicating the puerperium: ICD-10-CM

## 2016-10-19 DIAGNOSIS — G43909 Migraine, unspecified, not intractable, without status migrainosus: ICD-10-CM

## 2016-10-19 DIAGNOSIS — O1003 Pre-existing essential hypertension complicating the puerperium: ICD-10-CM

## 2016-10-19 DIAGNOSIS — O2413 Pre-existing diabetes mellitus, type 2, in the puerperium: ICD-10-CM

## 2016-10-19 DIAGNOSIS — E119 Type 2 diabetes mellitus without complications: ICD-10-CM

## 2016-10-19 DIAGNOSIS — E669 Obesity, unspecified: ICD-10-CM

## 2016-10-19 DIAGNOSIS — Z6841 Body Mass Index (BMI) 40.0 and over, adult: ICD-10-CM

## 2016-10-19 DIAGNOSIS — Z7984 Long term (current) use of oral hypoglycemic drugs: ICD-10-CM

## 2016-10-19 DIAGNOSIS — I160 Hypertensive urgency: ICD-10-CM

## 2016-10-19 DIAGNOSIS — O115 Pre-existing hypertension with pre-eclampsia, complicating the puerperium: Principal | ICD-10-CM

## 2016-10-19 DIAGNOSIS — R631 Polydipsia: ICD-10-CM

## 2016-10-19 LAB — COMPREHENSIVE METABOLIC PANEL
Lab: 0.2 mg/dL — ABNORMAL LOW (ref 0.3–1.2)
Lab: 1.4 mg/dL — ABNORMAL HIGH (ref 0.4–1.00)
Lab: 102 MMOL/L — ABNORMAL LOW (ref 98–110)
Lab: 11 U/L (ref 7–56)
Lab: 12 U/L (ref 7–40)
Lab: 131 mg/dL — ABNORMAL HIGH (ref 70–100)
Lab: 134 MMOL/L — ABNORMAL LOW (ref 137–147)
Lab: 25 MMOL/L (ref 21–30)
Lab: 27 mg/dL — ABNORMAL HIGH (ref 7–25)
Lab: 3.6 g/dL (ref 3.5–5.0)
Lab: 4.1 MMOL/L — ABNORMAL LOW (ref 3.5–5.1)
Lab: 40 mL/min — ABNORMAL LOW (ref >60)
Lab: 49 mL/min — ABNORMAL LOW (ref >60)
Lab: 6.7 g/dL (ref 6.0–8.0)
Lab: 67 U/L (ref 25–110)
Lab: 8.4 mg/dL — ABNORMAL LOW (ref 8.5–10.6)
Lab: 7 (ref 3–12)

## 2016-10-19 LAB — POC GLUCOSE
Lab: 152 mg/dL — ABNORMAL HIGH (ref 70–100)
Lab: 139 mg/dL — ABNORMAL HIGH (ref 70–100)

## 2016-10-19 LAB — CBC: Lab: 6.5 10*3/uL (ref 4.5–11.0)

## 2016-10-19 MED ORDER — NIFEDIPINE 90 MG PO TR24
90 mg | ORAL_TABLET | Freq: Every evening | ORAL | 1 refills | 30.00000 days | Status: AC
Start: 2016-10-19 — End: ?

## 2016-10-19 MED ORDER — LABETALOL 200 MG PO TAB
400 mg | ORAL_TABLET | ORAL | 1 refills | 60.00000 days | Status: AC
Start: 2016-10-19 — End: ?

## 2016-10-19 NOTE — Discharge Instructions
Bathing instructions:  SHOWERS ONLY FOR 6 WEEK.

## 2016-10-19 NOTE — Progress Notes
1911: Report received from K. Freida Busman, RN. Bedside safety check completed at this time.   2030: Assessment completed: see flowsheet for details. Dr. Beatrix Fetters notified of patient's headache. Orders for tylenol: see MAR for details.

## 2016-10-19 NOTE — Discharge Instructions
Signs & Symptoms:  *Temperature over 101 F F  *Soaking more than 1 pad/hour   *Clots larger than a quarter   *Foul smelling discharge   *Painful lump in breast  *Pain meds not working   *Incision drainage   *Incision red, swollen and warm   *Trouble breathing   *Chest pain   Calf pain with redness and warmth  Depression  Difficulty urinating or having a bowel movment  Headache accompanied by vision changes      FOR QUESTIONS DURING OFFICE HOURS PLEASE CALL THE [facility] AT [phone].   AFTER HOURS YOU MAY CALL [phone] AND ASK FOR THE OB RESIDENT ON-CALL.

## 2016-10-19 NOTE — Progress Notes
OB  Progress Note      Hospital Day 3       Subjective   No complaints this morning.  No blurry vision or epigastric pain. Denies fever/chills.         Objective     Patient Vitals for the past 24 hrs:   BP Temp Pulse SpO2   10/19/16 0352 115/62 36.8 ???C (98.2 ???F) 86 96 %   10/19/16 0000 102/59 36.4 ???C (97.5 ???F) 81 97 %   10/18/16 2018 118/59 36.5 ???C (97.7 ???F) 83 100 %   10/18/16 1603 126/76 36.4 ???C (97.5 ???F) 87 99 %   10/18/16 1257 124/72 36.5 ???C (97.7 ???F) 88 98 %   10/18/16 1100 127/73 36.5 ???C (97.7 ???F) 90 98 %   10/18/16 0903 128/78 36.3 ???C (97.3 ???F) 89 97 %   10/18/16 0758 128/75 36.4 ???C (97.5 ???F) 90 97 %   10/18/16 0722 123/75 36.5 ???C (97.7 ???F) 95 98 %         Physical Exam:  General:  No acute distress.  Heart:  Regular rate and rhythm.  Lungs: Clear to auscultation bilaterally.  Abdomen:  Soft, non-tender to palpation. Non-distended. Positive bowel sounds.  Incision: intact with some thickened tissue on left side of incision. No drainage.   Extremities: No edema BL LE.  Negative Homan's sign bilaterally.      Lab Review:  24-hour labs:    Results for orders placed or performed during the hospital encounter of 10/17/16 (from the past 24 hour(s))   MAGNESIUM    Collection Time: 10/18/16  5:27 AM   Result Value Ref Range    Magnesium 4.7 (H) 1.6 - 2.6 mg/dL   COMPREHENSIVE METABOLIC PANEL    Collection Time: 10/18/16  5:27 AM   Result Value Ref Range    Sodium 136 (L) 137 - 147 MMOL/L    Potassium 4.3 3.5 - 5.1 MMOL/L    Chloride 102 98 - 110 MMOL/L    Glucose 120 (H) 70 - 100 MG/DL    Blood Urea Nitrogen 18 7 - 25 MG/DL    Creatinine 0.98 (H) 0.4 - 1.00 MG/DL    Calcium 8.6 8.5 - 11.9 MG/DL    Total Protein 6.8 6.0 - 8.0 G/DL    Total Bilirubin 0.3 0.3 - 1.2 MG/DL    Albumin 3.7 3.5 - 5.0 G/DL    Alk Phosphatase 74 25 - 110 U/L    AST (SGOT) 16 7 - 40 U/L    CO2 26 21 - 30 MMOL/L    ALT (SGPT) 13 7 - 56 U/L    Anion Gap 8 3 - 12    eGFR Non African American 48 (L) >60 mL/min eGFR African American 58 (L) >60 mL/min   COMPREHENSIVE METABOLIC PANEL    Collection Time: 10/18/16  5:42 AM   Result Value Ref Range    Sodium 136 (L) 137 - 147 MMOL/L    Potassium 4.4 3.5 - 5.1 MMOL/L    Chloride 103 98 - 110 MMOL/L    Glucose 119 (H) 70 - 100 MG/DL    Blood Urea Nitrogen 19 7 - 25 MG/DL    Creatinine 1.47 (H) 0.4 - 1.00 MG/DL    Calcium 8.7 8.5 - 82.9 MG/DL    Total Protein 6.9 6.0 - 8.0 G/DL    Total Bilirubin 0.3 0.3 - 1.2 MG/DL    Albumin 3.5 3.5 - 5.0 G/DL    Alk Phosphatase 76 25 -  110 U/L    AST (SGOT) 15 7 - 40 U/L    CO2 24 21 - 30 MMOL/L    ALT (SGPT) 10 7 - 56 U/L    Anion Gap 9 3 - 12    eGFR Non African American 49 (L) >60 mL/min    eGFR African American 59 (L) >60 mL/min   MAGNESIUM    Collection Time: 10/18/16 11:00 AM   Result Value Ref Range    Magnesium 4.8 (H) 1.6 - 2.6 mg/dL   CBC    Collection Time: 10/18/16 11:00 AM   Result Value Ref Range    White Blood Cells 5.5 4.5 - 11.0 K/UL    RBC 2.96 (L) 4.0 - 5.0 M/UL    Hemoglobin 8.7 (L) 12.0 - 15.0 GM/DL    Hematocrit 16.1 (L) 36 - 45 %    MCV 90.5 80 - 100 FL    MCH 29.4 26 - 34 PG    MCHC 32.5 32.0 - 36.0 G/DL    RDW 09.6 11 - 15 %    Platelet Count 226 150 - 400 K/UL    MPV 7.6 7 - 11 FL   POC GLUCOSE    Collection Time: 10/18/16 12:46 PM   Result Value Ref Range    Glucose, POC 104 (H) 70 - 100 MG/DL   POC GLUCOSE    Collection Time: 10/18/16  3:41 PM   Result Value Ref Range    Glucose, POC 178 (H) 70 - 100 MG/DL   POC GLUCOSE    Collection Time: 10/18/16 11:06 PM   Result Value Ref Range    Glucose, POC 152 (H) 70 - 100 MG/DL       Point of Care Testing:  (Last 24 hours):  Glucose: (!) 119 (10/18/16 0542)  POC Glucose (Download): (!) 152 (10/18/16 2306)           Assessment     35yo [id number] s/p PLTCS 9/10 admitted for superimposed preeclampsia, HD#3    Comorbids: cHTN, DMII           Plan     s/p magnesium for 24 hours.    BP controlled with labetalol 400 q 8 hrs and nifedipine XL 90 nightly  AM CBC wnl Liver enzymes wnl  Cr  1.45-->1.32-->1.26-->1.48.   Diabetic diet  Continue metformin 1000 mg BID  POC glucose 2 hr post prandial    Return in one week for wound check, BP check, and repeat BMP to evaluate Cr    D/W Dr. Joella Prince, MD  [phone]

## 2016-10-19 NOTE — Discharge Instructions - Pharmacy
Physician Discharge Summary      Name: Tonya Ortiz  Medical Record Number: [id number]        Account Number:  [id number]  Date Of Birth:  09/29/1980                         Age:  35 years   Admit date:  10/17/2016                     Discharge date:  10/19/2016    Attending Physician:  Dr. Gita Kudo                Service: Obstetrics    Physician Summary completed by: Jettie Pagan, MD    Reason for hospitalization: Postpartum superimposed preeclampsia    Significant PMH:   Past Medical History:   Diagnosis Date   ??? Diabetes (HCC) 09/07/2014   ??? Hypertension    ??? Obesity 09/07/2014         Allergies: Latex    Admission Physical Exam notable for:      Vital Signs: Last Filed In 24 Hours                Vital Signs: 24 Hour Range   BP: 160/110 (09/28 1245)  Pulse: 101 (09/28 1245)  SpO2: 99 % (09/28 1244)  SpO2 Pulse: 101 (09/28 1244)  Height: 167.6 cm (66) (09/28 0837) BP: (152-183)/(86-122)   Pulse:  [83-101]   SpO2:  [98 %-100 %]    Intensity Pain Scale (Self Report): (not recorded) ???   ???  Gen: NAD  CV: Regular rate  Chest: Unlabored breathing  Abd: Gravid, nttp  Ext: No LE edema, nttp LE      Admission Lab/Radiology studies notable for:   A POS, Ab-, Pap LSIL, RI, HIV-, HBsAg-, Syph AB-, GC/CT -/-  ???  24-hour labs:          Results for orders placed or performed during the hospital encounter of 10/17/16 (from the past 24 hour(s))   CBC AND DIFF   ??? Collection Time: 10/17/16 11:21 AM   Result Value Ref Range   ??? White Blood Cells 6.0 4.5 - 11.0 K/UL   ??? RBC 3.61 (L) 4.0 - 5.0 M/UL   ??? Hemoglobin 10.8 (L) 12.0 - 15.0 GM/DL   ??? Hematocrit 32.0 (L) 36 - 45 %   ??? MCV 88.8 80 - 100 FL   ??? MCH 30.0 26 - 34 PG   ??? MCHC 33.8 32.0 - 36.0 G/DL   ??? RDW 13.2 11 - 15 %   ??? Platelet Count 364 150 - 400 K/UL   ??? MPV 7.7 7 - 11 FL   ??? Neutrophils 52 41 - 77 %   ??? Lymphocytes 39 24 - 44 %   ??? Monocytes 7 4 - 12 %   ??? Eosinophils 1 0 - 5 %   ??? Basophils 1 0 - 2 %   ??? Absolute Neutrophil Count 3.10 1.8 - 7.0 K/UL ??? Absolute Lymph Count 2.30 1.0 - 4.8 K/UL   ??? Absolute Monocyte Count 0.40 0 - 0.80 K/UL   ??? Absolute Eosinophil Count 0.10 0 - 0.45 K/UL   ??? Absolute Basophil Count 0.00 0 - 0.20 K/UL   COMPREHENSIVE METABOLIC PANEL   ??? Collection Time: 10/17/16 11:21 AM   Result Value Ref Range   ??? Sodium 134 (L) 137 - 147 MMOL/L   ??? Potassium 4.4 3.5 -  5.1 MMOL/L   ??? Chloride 100 98 - 110 MMOL/L   ??? Glucose 96 70 - 100 MG/DL   ??? Blood Urea Nitrogen 22 7 - 25 MG/DL   ??? Creatinine 1.45 (H) 0.4 - 1.00 MG/DL   ??? Calcium 9.5 8.5 - 10.6 MG/DL   ??? Total Protein 7.9 6.0 - 8.0 G/DL   ??? Total Bilirubin 0.3 0.3 - 1.2 MG/DL   ??? Albumin 4.1 3.5 - 5.0 G/DL   ??? Alk Phosphatase 86 25 - 110 U/L   ??? AST (SGOT) 12 7 - 40 U/L   ??? CO2 23 21 - 30 MMOL/L   ??? ALT (SGPT) 9 7 - 56 U/L   ??? Anion Gap 11 3 - 12   ??? eGFR Non African American 41 (L) >60 mL/min   ??? eGFR African American 50 (L) >60 mL/min   URINALYSIS DIPSTICK   ??? Collection Time: 10/17/16 11:27 AM   Result Value Ref Range   ??? Color,UA YELLOW ???   ??? Turbidity,UA CLEAR CLEAR-CLEAR   ??? Specific Gravity-Urine 1.015 1.003 - 1.035   ??? pH,UA 5.0 5.0 - 8.0   ??? Protein,UA NEG NEG-NEG   ??? Glucose,UA NEG NEG-NEG   ??? Ketones,UA NEG NEG-NEG   ??? Bilirubin,UA NEG NEG-NEG   ??? Blood,UA NEG NEG-NEG   ??? Urobilinogen,UA NORMAL NORM-NORMAL   ??? Nitrite,UA NEG NEG-NEG   ??? Leukocytes,UA NEG NEG-NEG   ??? Urine Ascorbic Acid, UA NEG NEG-NEG   URINALYSIS, MICROSCOPIC   ??? Collection Time: 10/17/16 11:27 AM   Result Value Ref Range   ??? WBCs,UA 0-2 0 - 2 /HPF   ??? RBCs,UA 0-2 0 - 3 /HPF   ??? MucousUA TRACE ???   ??? Squamous Epithelial Cells 2-5 0 - 5   ???  ???  Point of Care Testing:  Glucose: 96 (10/17/16 1121)      Brief Hospital Course:  The patient was admitted and the following issues were addressed during this hospitalization: (with pertinent details).      Patient presented to triage POD#17 with elevated BPs and headache refractory to ibuprofen. Labs revealed elevated creatinine. She was admitted for postpartum superimposed pre eclampsia. She received Magnesium therapy x 24 hours. Her labetalol was increased as was her Nifedipine. BMPs were drawn BID to follow her creatinine which trended down. Her headache improved on HD#2. On HD#3 after continued down trending of her creatinine and improvement of her BPs, she was discharged home with return precautions and quick follow up.       Condition at Discharge: Stable    Discharge Diagnoses:      Hospital Problems        Active Problems    Preeclampsia          Surgical Procedures: None    Significant Diagnostic Studies and Procedures: noted in brief hospital course    Consults:  None    Patient Disposition: Home       Patient instructions/medications:     Activity as Tolerated   It is important to keep increasing your activity level after you leave the hospital.  Moving around can help prevent blood clots, lung infection (pneumonia) and other problems.  Gradually increasing the number of times you are up moving around will help you return to your normal activity level more quickly.  Continue to increase the number of times you are up to the chair and walking daily to return to your normal activity level. Begin to work toward your normal  activity level at discharge     Report These Signs and Symptoms   Please contact your doctor if you have any of the following symptoms: temperature higher than 100 degrees F, uncontrolled pain, persistent nausea and/or vomiting, headache, unable to urinate, unable to have bowel movement, drainage with a foul odor or vaginal bleeding soaking more than 1 pad an hour for 2 or more hours.     Questions About Your Stay   For questions or concerns regarding your hospital stay. Call [phone]   Discharging attending physician: Marda Stalker [[id number]]      Regular Diet   You have no dietary restriction. Please continue with a healthy balanced diet.     Return Appointment 10/23/2016  9:45 AM    Monard, Phoebe Perch, MD      [facility]     Additional Discharge Instructions   Call or seek medical attention for chest pain, shortness of breath, fever greater than 101F, heavy vaginal bleeding soaking >1 pad per hour, passing clots larger than a quarter, increased cramping, uncontrollable pain, vaginal discharge with foul odor, incision drainage, incision red swollen & warm; headache accompanied by changes in vision, or emotional instability.    No sexual activity, tampons, douching or anything in the vagina for 6 weeks.    You may resume strenuous activity in 6 weeks.    You may return to work/school in 6 weeks.    Do not drive while taking narcotic pain medications.    May use colace, senna, or miralax for constipation.    If you have had a Cesarean Delivery, you may alternate using Tylenol, Ibuprofen, and your Oxycodone or other narcotic pain medication for pain control.     If you had a vaginal delivery, you may alternate Ibuprofen and the Tylenol for pain control     May use lanolin cream for sore nipples.    If you have an incision, you may wash it with warm soapy water.  It does not need to be covered with a bandage.        Current Discharge Medication List       CONTINUE these medications which have been CHANGED or REFILLED    Details   labetalol (NORMODYNE) 200 mg tablet Take two tablets by mouth every 8 hours for 60 days.  Qty: 180 tablet, Refills: 1    PRESCRIPTION TYPE:  Normal      NIFEdipine XL (PROCARDIA-XL) 90 mg tablet Take one tablet by mouth at bedtime daily for 60 days.  Qty: 30 tablet, Refills: 1    PRESCRIPTION TYPE:  Normal          CONTINUE these medications which have NOT CHANGED    Details   aspirin 325 mg tablet Take 325 mg by mouth daily. Take with food.    PRESCRIPTION TYPE:  Historical Med      cyclobenzaprine (FLEXERIL) 10 mg tablet Take 1 tablet by mouth three times daily as needed for Muscle Cramps.  Qty: 30 tablet, Refills: 2 PRESCRIPTION TYPE:  Normal  Associated Diagnoses: Muscle pain      hydrOXYzine pamoate (VISTARIL) 50 mg capsule Take 50 mg by mouth every 6 hours as needed for Itching.    PRESCRIPTION TYPE:  Historical Med      ibuprofen (MOTRIN) 600 mg tablet Take one tablet by mouth every 6 hours. Take with food.  Qty: 30 tablet, Refills: 0    PRESCRIPTION TYPE:  Normal  metFORMIN (GLUCOPHAGE) 1,000 mg tablet Take one tablet by mouth twice daily with meals.  Qty: 180 tablet, Refills: 1    PRESCRIPTION TYPE:  Normal      oxyCODONE (ROXICODONE, OXY-IR) 5 mg tablet Take 1-2 tablets by mouth every 4 hours as needed  Qty: 30 tablet, Refills: 0    PRESCRIPTION TYPE:  Print      PRENATAL VIT CALC,IRON,FOLIC (PRENATAL VITAMIN PO) Take 1 tablet by mouth daily.    PRESCRIPTION TYPE:  Historical Med      simethicone (MYLICON) 80 mg chew tablet Chew one tablet by mouth every 6 hours as needed for Flatulence.  Qty: 30 tablet, Refills: 0    PRESCRIPTION TYPE:  Normal          The following medications were removed from your list. This list includes medications discontinued this stay and those removed from your prior med list in our system        docusate (COLACE) 100 mg capsule        insulin aspart U-100 (NOVOLOG FLEXPEN U-100 INSULIN) 100 unit/mL injection PEN        insulin NPH (NOVOLIN N NPH U-100 INSULIN) 100 unit/mL injection        trimethoprim/sulfamethoxazole (BACTRIM DS) 160/800 mg tablet               Scheduled appointments:    Oct 23, 2016  9:45 AM CDT  Return Patient with Phill Mutter, MD  [facility] - [facility]) [facility] [address]  [address]  [place] [address]  [phone]          Pending items needing follow up: none    Signed:  Jettie Pagan, MD  10/19/2016      cc:  Primary Care Physician:  No Pcp, Na   No PCP  Referring physicians:  Self, Referral   Additional provider(s):

## 2016-10-19 NOTE — Progress Notes
Tonya Ortiz discharged on 10/19/2016 at 1039 from [address] in stable condition. Pt to peds after discharge to get her baby.   .  Discharge instructions reviewed with patient  Valuables returned: NA  Personal Items / Valuables: Cell Phone, Clothing  Where Are Valuables Stored?: WITH PATIENT.  Home medications: NA   .  Functional assessment at discharge complete: Yes

## 2016-10-20 MED ORDER — SULFAMETHOXAZOLE-TRIMETHOPRIM 800-160 MG PO TAB
1 | ORAL_TABLET | Freq: Two times a day (BID) | ORAL | 0 refills
Start: 2016-10-20 — End: ?

## 2016-10-20 NOTE — Telephone Encounter
Patient should have completed 7 day course of Bactrim. If another provider recommended a repeat course please route prescription to them.

## 2016-10-21 ENCOUNTER — Encounter: Admit: 2016-10-21 | Discharge: 2016-10-21 | Primary: Adult Health

## 2016-10-21 NOTE — Telephone Encounter
[facility] Case Management Note    Plan: 24-48 hour post discharge follow up    Intervention: [facility] case manager contacted patient by phone for 24-48 hour post discharge follow up.     Signs/Symptoms:  Patient reported that she is doing "good" at this time.  Patient reported no new signs/symptoms.  SWCM reviewed ongoing symptoms to monitor for per her discharge instructions and instructed patient to contact [place] if these symptoms occur.  Patient voiced her understanding.     Medication Questions or Concerns Addressed:  Patient was able to pick up her medications post discharge.  Per patient, her insurance covered the medications.     Dietary Information:  No dietary restrictions in place.       Follow up appointments:  Patient is not interested with establishing a PCP at this time.  SWCM to offer again upon next contact.      Future Appointments  Date Time Provider Department Center   10/23/2016 9:45 AM Monard, Phoebe Perch, MD [facility]       Transportation Verified:  Patient scheduled transport with [facility] for her 10/23/2016 appointment.     Plan of Care: SWCM to continue 30 day follow up to monitor for post discharge needs.       Margaretmary Eddy, LMSW  [phone]

## 2016-10-23 ENCOUNTER — Encounter: Admit: 2016-10-23 | Discharge: 2016-10-23 | Primary: Adult Health

## 2016-10-23 ENCOUNTER — Ambulatory Visit: Admit: 2016-10-23 | Discharge: 2016-10-23 | Payer: MEDICAID | Primary: Adult Health

## 2016-10-23 DIAGNOSIS — E119 Type 2 diabetes mellitus without complications: Principal | ICD-10-CM

## 2016-10-23 DIAGNOSIS — O114 Pre-existing hypertension with pre-eclampsia, complicating childbirth: Principal | ICD-10-CM

## 2016-10-23 DIAGNOSIS — Z23 Encounter for immunization: Secondary | ICD-10-CM

## 2016-10-23 DIAGNOSIS — E669 Obesity, unspecified: ICD-10-CM

## 2016-10-23 DIAGNOSIS — O1092 Unspecified pre-existing hypertension complicating childbirth: ICD-10-CM

## 2016-10-23 DIAGNOSIS — I10 Essential (primary) hypertension: ICD-10-CM

## 2016-10-23 LAB — COMPREHENSIVE METABOLIC PANEL
Lab: 0.3 mg/dL (ref 0.3–1.2)
Lab: 1 mg/dL — ABNORMAL HIGH (ref 0.4–1.00)
Lab: 103 MMOL/L (ref 98–110)
Lab: 11 U/L (ref 7–40)
Lab: 11 U/L (ref 7–56)
Lab: 136 MMOL/L — ABNORMAL LOW (ref 137–147)
Lab: 18 mg/dL (ref 7–25)
Lab: 25 MMOL/L (ref 21–30)
Lab: 4.2 g/dL (ref <5.7)
Lab: 4.5 MMOL/L (ref 3.5–5.1)
Lab: 57 mL/min — ABNORMAL LOW (ref >60)
Lab: 7.6 g/dL (ref 6.0–8.0)
Lab: 74 U/L (ref 25–110)
Lab: 8 (ref 3–12)
Lab: 84 mg/dL (ref 70–100)
Lab: 9.5 mg/dL (ref 8.5–10.6)
Lab: >60 mL/min (ref >60)

## 2016-10-23 NOTE — Progress Notes
Postpartum Visit    Subjective:     Tonya Ortiz is a 36 y.o.. [id number]    She presents 3 weeks postpartum following a primary Cesarean section at 37wod for superimposed pre-eclampsia and transverse lie .     Anesthesia: Spinal.    Labor: none    Delivery complications: severe superimosed preeclampsia     Postpartum course has been complicated by readmission to [facility] for refaractory headache and severe range blood pressures. Had eleavted Cr. She was treased with MgSO4 for 24 hours. Cr Downtrended. She was put on labetalol 400mg  TID and Procardia 90mg  XL, Reports compliance .     Baby is feeding bottle .    Bleeding staining only.     Patient is sexually active.    Postpartum depression screening: normal.  Baby doing well.      Prenatal care provider: Dr. Carolin Coy and [facility]  Delivery date:  10/09/16  Delivery physician:  Dr. Carolin Coy   Antepartum complications:  CHTN, DM    Gynecology history:  Menses:   Menstrual History   ??? Age at menarche     ??? Duration of menses     ??? Length of Cycle     ??? Menopause?     ??? Age at menopause        Last Pap:  was abnormal: LSIL  History of abnormal paps:    STD history: no past history  History   Sexual Activity   ??? Sexual activity: Not on file       Review of Systems   Constitutional: Negative for fever, fatigue and unexpected weight change.   HENT: Negative for voice change.   Respiratory: Negative for cough and shortness of breath.   Cardiovascular: Negative for chest pain and leg swelling.   Gastrointestinal: Negative for nausea, vomiting, abdominal pain, diarrhea, constipation and blood in stool.   Genitourinary: Negative for dysuria, urgency, frequency, hematuria, vaginal bleeding, vaginal discharge, enuresis, difficulty urinating, genital sores, vaginal pain, menstrual problem, pelvic pain and dyspareunia.   Musculoskeletal: Negative for back pain and arthralgias.   Skin: Negative for rash.   Neurological: Negative for light-headedness and headaches. Hematological: Negative for adenopathy. Does not bruise/bleed easily.   Psychiatric/Behavioral: Negative for confusion. The patient is not nervous/anxious.     Past Medical History:   Diagnosis Date   ??? Diabetes (HCC) 09/07/2014   ??? Hypertension    ??? Obesity 09/07/2014       Past Surgical History:   Procedure Laterality Date   ??? CESAREAN SECTION Bilateral 09/29/2016    CESAREAN SECTION performed by Myrtie Hawk, MD at [facility]       Family History   Problem Relation Age of Onset   ??? Hypertension Mother    ??? Hypertension Father    ??? High Cholesterol Father    ??? Hypertension Paternal Grandmother    ??? Diabetes Paternal Grandmother        Social History   Substance Use Topics   ??? Smoking status: Former Smoker     Packs/day: 0.05     Years: 0.25     Types: Cigarettes     Quit date: 04/21/2015   ??? Smokeless tobacco: Never Used   ??? Alcohol use No       Allergies  Latex     Medications  Current Outpatient Prescriptions   Medication Sig Dispense Refill   ??? aspirin 325 mg tablet Take 325 mg by mouth daily. Take with food.     ???  cyclobenzaprine (FLEXERIL) 10 mg tablet Take 1 tablet by mouth three times daily as needed for Muscle Cramps. 30 tablet 2   ??? hydrOXYzine pamoate (VISTARIL) 50 mg capsule Take 50 mg by mouth every 6 hours as needed for Itching.     ??? ibuprofen (MOTRIN) 600 mg tablet Take one tablet by mouth every 6 hours. Take with food. 30 tablet 0   ??? labetalol (NORMODYNE) 200 mg tablet Take two tablets by mouth every 8 hours for 60 days. 180 tablet 1   ??? metFORMIN (GLUCOPHAGE) 1,000 mg tablet Take one tablet by mouth twice daily with meals. 180 tablet 1   ??? NIFEdipine XL (PROCARDIA-XL) 90 mg tablet Take one tablet by mouth at bedtime daily for 60 days. 30 tablet 1   ??? oxyCODONE (ROXICODONE, OXY-IR) 5 mg tablet Take 1-2 tablets by mouth every 4 hours as needed 30 tablet 0   ??? PRENATAL VIT CALC,IRON,FOLIC (PRENATAL VITAMIN PO) Take 1 tablet by mouth daily. ??? simethicone (MYLICON) 80 mg chew tablet Chew one tablet by mouth every 6 hours as needed for Flatulence. 30 tablet 0     No current facility-administered medications for this visit.          Objective:     Physical Exam  BP 132/78  - Pulse 93  - Ht 167.6 cm (66)  - Wt (!) 157.9 kg (348 lb)  - BMI 56.17 kg/m???     Gen: NAD  CV: RRR, no appreciable murmurs  Chest: Unlabored, CTAB  Abd: Soft, nontender, nondistended, obese   Incision: nontender, well-approximated, no erythema  Ext: No LE edema, nttp LE  GU: normal appearing external genitalia     Postpartum Hgb  Lab Results   Component Value Date    HGB 9.5 (L) 10/19/2016       Assessment:      36 yo G7P1061 3 weeks s/p PLTCS for transverse lie and superimposed preeclampsia, doing well today    Visit diagnoses  1. Pre-eclampsia superimposed on chronic hypertension, delivered  COMPREHENSIVE METABOLIC PANEL   2. Postpartum care following cesarean delivery     3. Encounter for immunization     4. Routine postpartum follow-up     5. Need for vaccination  FLU VACCINE =>6 MONTHS QUADRIVALENT PF       Plan:   -BP WNL, CMP ordered  - Continue labetalol 400 mg TID and Procardia to 90 mg XL. ???  -Contraception - Nuva ring  -Pap - LSIL, f/u with PCP  -Discussed symptoms of postpartum depression, birth spacing and reproductive plans, and  implications of c-section on future pregnancies and deliveries.   -Discussed weight loss. Current weight 348lbs  -Flu Vaccine today   -Pt sees Carolin Coy for primary care, will follow up for care PRN or as clinically indicated.    Orders Placed This Encounter   ??? FLU VACCINE =>6 MONTHS QUADRIVALENT PF   ??? COMPREHENSIVE METABOLIC PANEL       Silvio Clayman, MD  PGY-2 Obsterics and Gynecology

## 2016-10-28 ENCOUNTER — Encounter: Admit: 2016-10-28 | Discharge: 2016-10-28 | Primary: Adult Health

## 2016-10-29 ENCOUNTER — Encounter: Admit: 2016-10-29 | Discharge: 2016-10-29 | Primary: Adult Health

## 2016-10-29 NOTE — Telephone Encounter
[facility] Case Management Note    Plan: 7-10 day post discharge follow up    Intervention: Continuum of care case manager contacted patient by phone for 7-10 day post discharge follow up.     Signs/Symptoms:  No new concerns or discomfort reported by patient.     Medication Questions or Concerns Addressed:  No new concerns.    Dietary Information:  No restrictions    Follow up appointments:  SWCM offered to assist patient with establishing a PCP and scheduling an appointment, but patient declined this offer at this time.       Future Appointments  Date Time Provider Department Center   12/04/2016 10:00 AM Aggie Moats, MD [facility]       Transportation Verified:  Patient to arrange.     Plan of Care: SWCM to continue 30 day follow up to further monitor for post discharge needs.       Margaretmary Eddy, LMSW  [phone]

## 2016-11-03 NOTE — Progress Notes
I have reviewed the case with Dr. Monard at the time of the visit and agree with the evaluation and plan as documented by the resident.

## 2016-11-04 ENCOUNTER — Encounter: Admit: 2016-11-04 | Discharge: 2016-11-04 | Primary: Adult Health

## 2016-11-04 NOTE — Telephone Encounter
Pt called c/o back spasm that radiate up her back to her neck. On a pain scale 1-10, with 10 being the worst, pt stated when she has the spasm it's at a 10. She stated to the point that she would scare she would drop the baby. When asked she stated that she has headaches on and off. Neither the back pain or headaches or relieved with Tylenol or Ibuprofen. Pt had no s/s of a UTI, which could be related to the back pain. I told her I would send a msg to the doctor. Pt was advised to go too the ER. She stated she has and they just look at her like she is crazy. We will call her back with any recommendations.

## 2016-12-05 ENCOUNTER — Encounter: Admit: 2016-12-05 | Discharge: 2016-12-05 | Primary: Adult Health

## 2016-12-05 ENCOUNTER — Ambulatory Visit: Admit: 2016-12-05 | Discharge: 2016-12-05 | Payer: MEDICAID | Primary: Adult Health

## 2016-12-05 DIAGNOSIS — E119 Type 2 diabetes mellitus without complications: Principal | ICD-10-CM

## 2016-12-05 DIAGNOSIS — E669 Obesity, unspecified: ICD-10-CM

## 2016-12-05 DIAGNOSIS — I10 Essential (primary) hypertension: ICD-10-CM

## 2016-12-05 NOTE — Progress Notes
Date of Service: 12/05/2016    Subjective:             Tonya Ortiz is a 36 y.o. female here for postpartum follow up.     History of Present Illness  Doing well since delivery. She is eager to return to work. Mood is great, no SI/HI. Scored zero on Edinburgh scoring system. Her PCP put her on a muscle relaxer which helped with her back spasms. Is formula feeding. He rincision is well healed and she is not having any discharge of feeling any pain there e       Review of Systems   Constitutional: Negative for fatigue, fever and unexpected weight change.   HENT: Negative for voice change.    Respiratory: Negative for cough and shortness of breath.    Cardiovascular: Negative for chest pain and leg swelling.   Gastrointestinal: Negative for abdominal pain, blood in stool, constipation, diarrhea, nausea and vomiting.   Genitourinary: Negative for difficulty urinating, dyspareunia, dysuria, enuresis, frequency, genital sores, hematuria, menstrual problem, pelvic pain, urgency, vaginal bleeding, vaginal discharge and vaginal pain.   Musculoskeletal: Negative for arthralgias and back pain.   Skin: Negative for rash.   Neurological: Negative for light-headedness and headaches.   Hematological: Negative for adenopathy. Does not bruise/bleed easily.   Psychiatric/Behavioral: Negative for confusion. The patient is not nervous/anxious.          Objective:         ??? aspirin 325 mg tablet Take 325 mg by mouth daily. Take with food.   ??? clotrimazole-betamethasone (LOTRISONE) 1-0.05 % topical cream    ??? cyclobenzaprine (FLEXERIL) 10 mg tablet Take 1 tablet by mouth three times daily as needed for Muscle Cramps.   ??? hydrOXYzine pamoate (VISTARIL) 50 mg capsule Take 50 mg by mouth every 6 hours as needed for Itching.   ??? ibuprofen (MOTRIN) 600 mg tablet Take one tablet by mouth every 6 hours. Take with food.   ??? labetalol (NORMODYNE) 200 mg tablet Take two tablets by mouth every 8 hours for 60 days. ??? lisinopril (PRINIVIL; ZESTRIL) 10 mg tablet    ??? metFORMIN (GLUCOPHAGE) 1,000 mg tablet Take one tablet by mouth twice daily with meals.   ??? NIFEdipine XL (PROCARDIA-XL) 90 mg tablet Take one tablet by mouth at bedtime daily for 60 days.   ??? oxyCODONE (ROXICODONE, OXY-IR) 5 mg tablet Take 1-2 tablets by mouth every 4 hours as needed   ??? PRENATAL VIT CALC,IRON,FOLIC (PRENATAL VITAMIN PO) Take 1 tablet by mouth daily.   ??? simethicone (MYLICON) 80 mg chew tablet Chew one tablet by mouth every 6 hours as needed for Flatulence.     Vitals:    12/05/16 1018   BP: (!) 144/105   Pulse: 96   Weight: (!) 164.5 kg (362 lb 9.6 oz)   Height: 167.6 cm (66)     Body mass index is 58.53 kg/m???.     Physical Exam   Constitutional: She appears well-developed.   Obese    HENT:   Head: Normocephalic.   Cardiovascular: Normal rate.    Pulmonary/Chest: Effort normal.   Abdominal: Soft. She exhibits no distension. There is no tenderness.   Well healed pfannenstiel  incision             Assessment and Plan:  36 yo G7P1061 well here 9 wks after cesarean section. Doing very well.   -Ok to return to work with no restrictions   -chronic HTN: Bp elevated today. On  repeat 132/86 has not been taking labetalol or procardia as prescribed. Discussed taking these. Has appt with her PCP next week. Plans to return to prepregnancy regimen.   -PCP Jaclyn Prime in [place]. Will follow up PRN    -Has mirena IUD in place for contraception. NOT nuvaring as previously documented     D/w Dr. Gwinda Passe, MD  PGY-3 Obsterics and Gynecology

## 2016-12-18 NOTE — Progress Notes
I have reviewed the case with Dr. McLaren at the time of the visit and agree with the evaluation and plan as documented by the resident.

## 2017-01-15 ENCOUNTER — Ambulatory Visit: Admit: 2017-01-15 | Discharge: 2017-01-15 | Payer: MEDICAID | Primary: Adult Health

## 2017-01-15 ENCOUNTER — Encounter: Admit: 2017-01-15 | Discharge: 2017-01-15 | Primary: Adult Health

## 2017-01-15 DIAGNOSIS — E669 Obesity, unspecified: ICD-10-CM

## 2017-01-15 DIAGNOSIS — I10 Essential (primary) hypertension: ICD-10-CM

## 2017-01-15 DIAGNOSIS — Z30431 Encounter for routine checking of intrauterine contraceptive device: Principal | ICD-10-CM

## 2017-01-15 DIAGNOSIS — E119 Type 2 diabetes mellitus without complications: Principal | ICD-10-CM

## 2017-01-15 NOTE — Progress Notes
Date of Service: 01/15/2017    Subjective:             Tonya Ortiz is a 36 y.o. female here for IUD check    History of Present Illness  Doing well since delivery.   She is not sure her IUD is in anymore. States that she could feel it before but no longer feels it. Has not had menses since delivery. Has had unprotected sex since delivery. Wonders if she could be pregnant.        Review of Systems   Constitutional: Negative for fatigue, fever and unexpected weight change.   HENT: Negative for voice change.    Respiratory: Negative for cough and shortness of breath.    Cardiovascular: Negative for chest pain and leg swelling.   Gastrointestinal: Negative for abdominal pain, blood in stool, constipation, diarrhea, nausea and vomiting.   Genitourinary: Negative for difficulty urinating, dyspareunia, dysuria, enuresis, frequency, genital sores, hematuria, menstrual problem, pelvic pain, urgency, vaginal bleeding, vaginal discharge and vaginal pain.   Musculoskeletal: Negative for arthralgias and back pain.   Skin: Negative for rash.   Neurological: Negative for light-headedness and headaches.   Hematological: Negative for adenopathy. Does not bruise/bleed easily.   Psychiatric/Behavioral: Negative for confusion. The patient is not nervous/anxious.          Objective:         ??? aspirin 325 mg tablet Take 325 mg by mouth daily. Take with food.   ??? clotrimazole-betamethasone (LOTRISONE) 1-0.05 % topical cream    ??? cyclobenzaprine (FLEXERIL) 10 mg tablet Take 1 tablet by mouth three times daily as needed for Muscle Cramps.   ??? hydrOXYzine pamoate (VISTARIL) 50 mg capsule Take 50 mg by mouth every 6 hours as needed for Itching.   ??? ibuprofen (MOTRIN) 600 mg tablet Take one tablet by mouth every 6 hours. Take with food.   ??? lisinopril (PRINIVIL; ZESTRIL) 10 mg tablet    ??? metFORMIN (GLUCOPHAGE) 1,000 mg tablet Take one tablet by mouth twice daily with meals. ??? oxyCODONE (ROXICODONE, OXY-IR) 5 mg tablet Take 1-2 tablets by mouth every 4 hours as needed   ??? PRENATAL VIT CALC,IRON,FOLIC (PRENATAL VITAMIN PO) Take 1 tablet by mouth daily.   ??? simethicone (MYLICON) 80 mg chew tablet Chew one tablet by mouth every 6 hours as needed for Flatulence.     Vitals:    01/15/17 0952   BP: 132/87   Pulse: 99   Weight: (!) 167.5 kg (369 lb 3.2 oz)   Height: 167.6 cm (66)     Body mass index is 59.59 kg/m???.     Physical Exam   Constitutional: She appears well-developed.   Obese    HENT:   Head: Normocephalic.   Cardiovascular: Normal rate.   Pulmonary/Chest: Effort normal.   Abdominal: Soft. She exhibits no distension. There is no tenderness.   Well healed pfannenstiel  incision    GU: small cervix appears closed, no strings visualized          Assessment and Plan:  36 yo G7P1061 well here 9 wks after cesarean section presenting for IUD check   -PCP Jaclyn Prime in [place]. Will follow up PRN    -Has mirena IUD in place for contraception. IUD strings not visualized  -TVUS and urine pregnancy test ordered  RTC PRN    D/w Dr.Ault       Silvio Clayman, MD  Obstetrics and Gynecology, PGY2

## 2017-01-15 NOTE — Progress Notes
I have reviewed the case with Dr. Monard at the time of the visit and agree with the evaluation and plan as documented by the resident.

## 2017-01-16 ENCOUNTER — Encounter: Admit: 2017-01-16 | Discharge: 2017-01-16 | Primary: Adult Health

## 2017-01-16 NOTE — Telephone Encounter
Pt called wanting to know the results of her urine pregnancy test. I told her it was neg. She asked, why she feel sick to her stomach, body feels hot and cold, and heartburn. I told her I didn't know, maybe she's coming down with a bug. Pt was advised to monitor s/s, pt had no c/o vomiting. I told her I would notify the doctor. If she has any answers, we will call her back.

## 2017-01-22 ENCOUNTER — Encounter: Admit: 2017-01-22 | Discharge: 2017-01-22 | Primary: Adult Health

## 2017-01-22 ENCOUNTER — Ambulatory Visit: Admit: 2017-01-22 | Discharge: 2017-01-22 | Payer: MEDICAID | Primary: Adult Health

## 2017-01-22 DIAGNOSIS — Z30431 Encounter for routine checking of intrauterine contraceptive device: Principal | ICD-10-CM

## 2017-01-22 DIAGNOSIS — O24112 Pre-existing diabetes mellitus, type 2, in pregnancy, second trimester: Principal | ICD-10-CM

## 2017-01-22 MED ORDER — INSULIN ASPART 100 UNIT/ML SC FLEXPEN
6 refills
Start: 2017-01-22 — End: ?

## 2017-01-22 MED ORDER — INSULIN NPH ISOPH U-100 HUMAN 100 UNIT/ML SC SUSP
11 refills
Start: 2017-01-22 — End: ?

## 2017-01-22 MED ORDER — PEN NEEDLE, DIABETIC 32 GAUGE X 5/32" MISC NDLE
Freq: Three times a day (TID) | 3 refills | PRN
Start: 2017-01-22 — End: ?

## 2017-01-31 ENCOUNTER — Encounter: Admit: 2017-01-31 | Discharge: 2017-01-31 | Primary: Adult Health

## 2017-02-02 ENCOUNTER — Encounter: Admit: 2017-02-02 | Discharge: 2017-02-02 | Primary: Adult Health

## 2017-02-02 ENCOUNTER — Ambulatory Visit: Admit: 2017-02-02 | Discharge: 2017-02-02 | Payer: MEDICAID | Primary: Adult Health

## 2017-02-02 DIAGNOSIS — E669 Obesity, unspecified: ICD-10-CM

## 2017-02-02 DIAGNOSIS — E119 Type 2 diabetes mellitus without complications: Principal | ICD-10-CM

## 2017-02-02 DIAGNOSIS — T839XXS Unspecified complication of genitourinary prosthetic device, implant and graft, sequela: Principal | ICD-10-CM

## 2017-02-02 DIAGNOSIS — I10 Essential (primary) hypertension: ICD-10-CM

## 2017-02-03 ENCOUNTER — Encounter: Admit: 2017-02-03 | Discharge: 2017-02-03 | Primary: Adult Health

## 2017-02-05 ENCOUNTER — Encounter: Admit: 2017-02-05 | Discharge: 2017-02-05 | Primary: Adult Health

## 2017-02-10 ENCOUNTER — Encounter: Admit: 2017-02-10 | Discharge: 2017-02-10 | Primary: Adult Health

## 2017-02-10 DIAGNOSIS — R87618 Other abnormal cytological findings on specimens from cervix uteri: Principal | ICD-10-CM

## 2017-02-10 DIAGNOSIS — T8332XS Displacement of intrauterine contraceptive device, sequela: ICD-10-CM

## 2017-02-11 ENCOUNTER — Encounter: Admit: 2017-02-11 | Discharge: 2017-02-11 | Primary: Adult Health

## 2017-02-11 ENCOUNTER — Ambulatory Visit: Admit: 2017-03-04 | Discharge: 2017-03-05 | Primary: Adult Health

## 2017-02-11 DIAGNOSIS — T8332XS Displacement of intrauterine contraceptive device, sequela: Principal | ICD-10-CM

## 2017-02-11 DIAGNOSIS — R87619 Unspecified abnormal cytological findings in specimens from cervix uteri: ICD-10-CM

## 2017-02-23 ENCOUNTER — Encounter: Admit: 2017-02-23 | Discharge: 2017-02-23 | Primary: Adult Health

## 2017-02-23 ENCOUNTER — Ambulatory Visit: Admit: 2017-02-23 | Discharge: 2017-02-24 | Primary: Adult Health

## 2017-02-23 ENCOUNTER — Ambulatory Visit: Admit: 2017-02-23 | Discharge: 2017-02-23 | Primary: Adult Health

## 2017-02-23 DIAGNOSIS — I10 Essential (primary) hypertension: ICD-10-CM

## 2017-02-23 DIAGNOSIS — E119 Type 2 diabetes mellitus without complications: Principal | ICD-10-CM

## 2017-02-23 DIAGNOSIS — E669 Obesity, unspecified: ICD-10-CM

## 2017-02-23 DIAGNOSIS — Z01818 Encounter for other preprocedural examination: Secondary | ICD-10-CM

## 2017-02-23 DIAGNOSIS — T8332XS Displacement of intrauterine contraceptive device, sequela: Principal | ICD-10-CM

## 2017-02-23 LAB — BASIC METABOLIC PANEL
Lab: 102 MMOL/L — ABNORMAL LOW (ref 98–110)
Lab: 138 MMOL/L — ABNORMAL LOW (ref 137–147)
Lab: 159 mg/dL — ABNORMAL HIGH (ref 70–100)
Lab: 28 MMOL/L (ref 21–30)
Lab: 4.2 MMOL/L — ABNORMAL LOW (ref 3.5–5.1)
Lab: 8 mg/dL (ref 3–12)

## 2017-02-23 LAB — CBC: Lab: 7.6 10*3/uL (ref 4.5–11.0)

## 2017-02-24 DIAGNOSIS — R000 Tachycardia, unspecified: ICD-10-CM

## 2017-02-24 DIAGNOSIS — Z01810 Encounter for preprocedural cardiovascular examination: Principal | ICD-10-CM

## 2017-03-04 ENCOUNTER — Encounter: Admit: 2017-03-04 | Discharge: 2017-03-04 | Primary: Adult Health

## 2017-03-04 DIAGNOSIS — I10 Essential (primary) hypertension: ICD-10-CM

## 2017-03-04 DIAGNOSIS — E119 Type 2 diabetes mellitus without complications: Principal | ICD-10-CM

## 2017-03-04 DIAGNOSIS — E669 Obesity, unspecified: ICD-10-CM

## 2017-03-04 LAB — POC GLUCOSE
Lab: 119 mg/dL — ABNORMAL HIGH (ref 70–100)
Lab: 123 mg/dL — ABNORMAL HIGH (ref 70–100)

## 2017-03-04 LAB — PREGNANCY TEST-URINE
Lab: 1
Lab: NEGATIVE

## 2017-03-04 MED ORDER — HALOPERIDOL LACTATE 5 MG/ML IJ SOLN
1 mg | Freq: Once | INTRAVENOUS | 0 refills | Status: CP | PRN
Start: 2017-03-04 — End: ?
  Administered 2017-03-04: 22:00:00 1 mg via INTRAVENOUS

## 2017-03-04 MED ORDER — FENTANYL CITRATE (PF) 50 MCG/ML IJ SOLN
50 ug | INTRAVENOUS | 0 refills | Status: DC | PRN
Start: 2017-03-04 — End: 2017-03-05

## 2017-03-04 MED ORDER — HYDROCODONE-ACETAMINOPHEN 5-325 MG PO TAB
1-2 | Freq: Once | ORAL | 0 refills | Status: CP
Start: 2017-03-04 — End: ?
  Administered 2017-03-04: 23:00:00 2 via ORAL

## 2017-03-04 MED ORDER — MIDAZOLAM 1 MG/ML IJ SOLN
INTRAVENOUS | 0 refills | Status: DC
Start: 2017-03-04 — End: 2017-03-04
  Administered 2017-03-04: 20:00:00 2 mg via INTRAVENOUS

## 2017-03-04 MED ORDER — DEXTRAN 70-HYPROMELLOSE (PF) 0.1-0.3 % OP DPET
0 refills | Status: DC
Start: 2017-03-04 — End: 2017-03-04
  Administered 2017-03-04: 20:00:00 2 [drp] via OPHTHALMIC

## 2017-03-04 MED ORDER — DEXAMETHASONE SODIUM PHOSPHATE 4 MG/ML IJ SOLN
INTRAVENOUS | 0 refills | Status: DC
Start: 2017-03-04 — End: 2017-03-04
  Administered 2017-03-04: 20:00:00 4 mg via INTRAVENOUS

## 2017-03-04 MED ORDER — SUCCINYLCHOLINE CHLORIDE 20 MG/ML IJ SOLN
INTRAVENOUS | 0 refills | Status: DC
Start: 2017-03-04 — End: 2017-03-04
  Administered 2017-03-04: 20:00:00 140 mg via INTRAVENOUS

## 2017-03-04 MED ORDER — PROPOFOL INJ 10 MG/ML IV VIAL
0 refills | Status: DC
Start: 2017-03-04 — End: 2017-03-04
  Administered 2017-03-04: 20:00:00 200 mg via INTRAVENOUS
  Administered 2017-03-04: 20:00:00 90 mg via INTRAVENOUS

## 2017-03-04 MED ORDER — NORETHINDRONE (CONTRACEPTIVE) 0.35 MG PO TAB
1 | ORAL_TABLET | Freq: Every day | ORAL | 3 refills | 45.00000 days | Status: AC
Start: 2017-03-04 — End: ?

## 2017-03-04 MED ORDER — ONDANSETRON HCL (PF) 4 MG/2 ML IJ SOLN
INTRAVENOUS | 0 refills | Status: DC
Start: 2017-03-04 — End: 2017-03-04
  Administered 2017-03-04: 20:00:00 4 mg via INTRAVENOUS

## 2017-03-04 MED ORDER — ACETIC ACID (BULK) 5 % MISC LIQD
0 refills | Status: DC
Start: 2017-03-04 — End: 2017-03-04
  Administered 2017-03-04: 20:00:00 1

## 2017-03-04 MED ORDER — LIDOCAINE (PF) 10 MG/ML (1 %) IJ SOLN
.1-2 mL | INTRAMUSCULAR | 0 refills | Status: DC | PRN
Start: 2017-03-04 — End: 2017-03-05

## 2017-03-04 MED ORDER — HYDROCODONE-ACETAMINOPHEN 5-325 MG PO TAB
1-2 | ORAL_TABLET | ORAL | 0 refills | 15.00000 days | Status: AC | PRN
Start: 2017-03-04 — End: ?
  Filled 2017-03-05 (×2): qty 4, 1d supply, fill #1

## 2017-03-04 MED ORDER — LIDOCAINE (PF) 200 MG/10 ML (2 %) IJ SYRG
0 refills | Status: DC
Start: 2017-03-04 — End: 2017-03-04
  Administered 2017-03-04 (×2): 100 mg via INTRAVENOUS

## 2017-03-04 MED ORDER — FENTANYL CITRATE (PF) 50 MCG/ML IJ SOLN
25 ug | INTRAVENOUS | 0 refills | Status: DC | PRN
Start: 2017-03-04 — End: 2017-03-05

## 2017-03-04 MED ORDER — LACTATED RINGERS IV SOLP
1000 mL | INTRAVENOUS | 0 refills | Status: DC
Start: 2017-03-04 — End: 2017-03-05
  Administered 2017-03-04 (×2): 1000 mL via INTRAVENOUS
  Administered 2017-03-04: 20:00:00 1000.000 mL via INTRAVENOUS

## 2017-03-04 MED ORDER — FENTANYL CITRATE (PF) 50 MCG/ML IJ SOLN
0 refills | Status: DC
Start: 2017-03-04 — End: 2017-03-04
  Administered 2017-03-04 (×2): 50 ug via INTRAVENOUS

## 2017-03-04 MED ORDER — ONDANSETRON HCL (PF) 4 MG/2 ML IJ SOLN
4 mg | Freq: Once | INTRAVENOUS | 0 refills | Status: DC | PRN
Start: 2017-03-04 — End: 2017-03-05

## 2017-03-04 MED ORDER — PHENYLEPHRINE IN 0.9% NACL(PF) 1 MG/10 ML (100 MCG/ML) IV SYRG
INTRAVENOUS | 0 refills | Status: DC
Start: 2017-03-04 — End: 2017-03-04
  Administered 2017-03-04: 21:00:00 100 ug via INTRAVENOUS
  Administered 2017-03-04: 21:00:00 200 ug via INTRAVENOUS
  Administered 2017-03-04: 21:00:00 100 ug via INTRAVENOUS

## 2017-03-05 ENCOUNTER — Ambulatory Visit: Admit: 2017-03-04 | Discharge: 2017-03-04 | Primary: Adult Health

## 2017-03-05 ENCOUNTER — Encounter: Admit: 2017-03-05 | Discharge: 2017-03-05 | Primary: Adult Health

## 2017-03-05 DIAGNOSIS — T8332XS Displacement of intrauterine contraceptive device, sequela: Principal | ICD-10-CM

## 2017-03-05 DIAGNOSIS — R102 Pelvic and perineal pain: ICD-10-CM

## 2017-03-05 DIAGNOSIS — E119 Type 2 diabetes mellitus without complications: ICD-10-CM

## 2017-03-05 DIAGNOSIS — I10 Essential (primary) hypertension: ICD-10-CM

## 2017-03-05 DIAGNOSIS — R87618 Other abnormal cytological findings on specimens from cervix uteri: ICD-10-CM

## 2017-03-05 DIAGNOSIS — Z7984 Long term (current) use of oral hypoglycemic drugs: ICD-10-CM

## 2017-03-05 DIAGNOSIS — Z87891 Personal history of nicotine dependence: ICD-10-CM

## 2017-03-06 ENCOUNTER — Encounter: Admit: 2017-03-06 | Discharge: 2017-03-06 | Primary: Adult Health

## 2017-03-06 DIAGNOSIS — E669 Obesity, unspecified: ICD-10-CM

## 2017-03-06 DIAGNOSIS — I10 Essential (primary) hypertension: ICD-10-CM

## 2017-03-06 DIAGNOSIS — E119 Type 2 diabetes mellitus without complications: Principal | ICD-10-CM

## 2017-03-17 ENCOUNTER — Encounter: Admit: 2017-03-17 | Discharge: 2017-03-17 | Primary: Adult Health

## 2017-03-17 MED ORDER — DOXYCYCLINE HYCLATE 100 MG PO TAB
100 mg | ORAL_TABLET | Freq: Two times a day (BID) | ORAL | 0 refills | 8.00000 days | Status: AC
Start: 2017-03-17 — End: ?
  Filled 2017-03-17 (×2): qty 14, 7d supply, fill #1

## 2017-04-23 ENCOUNTER — Encounter: Admit: 2017-04-23 | Discharge: 2017-04-23 | Primary: Adult Health

## 2017-04-23 DIAGNOSIS — I10 Essential (primary) hypertension: ICD-10-CM

## 2017-04-23 DIAGNOSIS — E119 Type 2 diabetes mellitus without complications: Principal | ICD-10-CM

## 2017-04-23 DIAGNOSIS — E669 Obesity, unspecified: ICD-10-CM

## 2017-12-24 NOTE — Other (Unsigned)
Patient Acct Nbr: [id number]   Primary AUTH/CERT:   Primary Insurance Company Name: [employer]  Primary Insurance Plan name: [facility]  Primary Insurance Group Number: [id number]  Primary Insurance Plan Type: Health  Primary Insurance Policy Number: [id number]

## 2017-12-25 NOTE — Telephone Encounter (Signed)
Name of Caller: Sierra [name]    Contact phone number: [phone]    Relationship to Patient: patient    Provider: Dr Loraine LericheMark    Practice:  Derm    Chief Complaint/Reason for Call: Pt is calling in to schedule a new patient appt and states a referral was sent to the office yesterday.  Pt states that she is trying to get in within the week- she states that this is urgent.  She states that she has cellulitis, a yeast infection, a skin infection, and a fungal infection and has already been to the ER for it.  Please call pt back to discuss.    Best time of day caller can be reached: Any       Patient advised that office/PCP has 24-48 business hours to return their call: No

## 2017-12-28 NOTE — Telephone Encounter (Signed)
Called PT and LDVM #1 regarding message below. Provided all office information to return call.

## 2017-12-29 NOTE — Telephone Encounter (Signed)
Pt states she is going to call whoever referred her so they can send over the urgent referral so we can schedule her appt and she will call us back.

## 2018-01-05 ENCOUNTER — Ambulatory Visit
Admit: 2018-01-05 | Discharge: 2018-01-05 | Payer: PRIVATE HEALTH INSURANCE | Attending: Physician Assistant | Primary: Student in an Organized Health Care Education/Training Program

## 2018-01-05 DIAGNOSIS — E65 Localized adiposity: Secondary | ICD-10-CM

## 2018-01-05 NOTE — Patient Instructions (Addendum)
Patient advised to be seen in the nearest emergency department for further evaluation and treatment. It may be necessary for patient to have inpatient treatment to get infection under control, then continue are with ID/Wound/Derm for maintenance.     I consulted the referring physician, Dr. Maryagnes AmosLeone (wound), and he is agreeable with this plan.        Montel Culverlivia J Myers, PA-C  1:49 PM         Patient Instructions:  Once treated through ED, patient to begin cleansing the skin with J&J Baby Shampoo. Dry area THOROUGHLY, pat skin dry, and pull excess skin back allowing skin to have air circulation to dry the skin completely.

## 2018-01-05 NOTE — Progress Notes (Signed)
Dermatology  [address], [address]  [place], [place] [address]      DATE OF SERVICE: 01/05/2018  PATIENT NAME:  Sierra [name]  DOB:  1980/12/21  AGE:  37 y.o.  CLINIC NUMBER:  [id number]      No chief complaint on file.      Subjective   HISTORY OF PRESENT ILLNESS:  This is a 37 y.o. female who presents for evaluation of yeast infection, fungal infection and cellulitis of lower legs and groin x 5 months. Referred by Dr. Florian Buff, [facility].     She is getting blisters and severe irritation on the pannus and thighs.  Using Nystatin Powder. Water burns the skin.  Did better when she was on fluconazole, doxycycline and cefadroxil, but worsened when these were stopped.     Pt states she was last hospitalized at hospital in [facility] in Nov 2019. She was given IV fluids and antibiotics-Pt states this is the only thing that has helped her.  Pt states she has also been on numerous Rx oral ABs, cream and powders with minimal improvement. Pt states the upper legs are very swollen, draining, and painful. Pt states it is hard to walk and do daily activities; she is unable to care for her 67 mo old child. She cannot go up/down steps. Pain when walking. Pt states she needs this cleared up or she is going to lose her job.    Patient has seen a dermatologist in the past--2-3 months ago saw Wille Glaser CNP with [facility]  in [place].      Are you pregnant, trying to become pregnant or breastfeeding? no  History of pacemaker/ defibrillator? no  History of HIV/ Hep C? no  Allergies to Lidocaine, Epinephrine, Latex or Adhesive? no    Social History     Socioeconomic History   ??? Marital status: Divorced     Spouse name: Not on file   ??? Number of children: Not on file   ??? Years of education: Not on file   ??? Highest education level: Not on file   Occupational History   ??? Not on file   Social Needs   ??? Financial resource strain: Not on file   ??? Food insecurity:     Worry: Not on file     Inability: Not on file   ??? Transportation  needs:     Medical: Not on file     Non-medical: Not on file   Tobacco Use   ??? Smoking status: Not on file   Substance and Sexual Activity   ??? Alcohol use: Not on file   ??? Drug use: Not on file   ??? Sexual activity: Not on file   Lifestyle   ??? Physical activity:     Days per week: Not on file     Minutes per session: Not on file   ??? Stress: Not on file   Relationships   ??? Social connections:     Talks on phone: Not on file     Gets together: Not on file     Attends religious service: Not on file     Active member of club or organization: Not on file     Attends meetings of clubs or organizations: Not on file     Relationship status: Not on file   ??? Intimate partner violence:     Fear of current or ex partner: Not on file     Emotionally  abused: Not on file     Physically abused: Not on file     Forced sexual activity: Not on file   Other Topics Concern   ??? Not on file   Social History Narrative   ??? Not on file       No past medical history on file.    No past surgical history on file.    No family history on file.    Current Outpatient Medications   Medication Sig Dispense Refill   ??? ranitidine (ZANTAC) 150 MG tablet Take 150 mg by mouth     ??? oxaprozin (DAYPRO) 600 MG tablet Take 600 mg by mouth     ??? omeprazole (PRILOSEC) 40 MG delayed release capsule TAKE 1 CAPSULE BY MOUTH ONCE DAILY 30 MINUTES BEFORE MORNING MEAL     ??? methocarbamol (ROBAXIN) 750 MG tablet      ??? metFORMIN (GLUCOPHAGE) 1000 MG tablet      ??? lisinopril (PRINIVIL;ZESTRIL) 5 MG tablet Take 5 mg by mouth     ??? lisinopril (PRINIVIL;ZESTRIL) 20 MG tablet Take 20 mg by mouth     ??? carvedilol (COREG) 25 MG tablet Take 25 mg by mouth     ??? amLODIPine (NORVASC) 10 MG tablet Take 10 mg by mouth       No current facility-administered medications for this visit.        No Known Allergies      REVIEW OF SYSTEMS:   General/Constitutional   Feels well; denies h/o fatigue, weight change, night sweats, fevers or chills.  Lymphatic  Denies swollen lymph  nodes.  Dermatologic  As per HPI; denies new itching, hair loss, skin or nail changes. +skin rash and pain    Objective     Vitals:    01/05/18 1318   BP: 115/75   Pulse: 93   Temp: 99 ??F (37.2 ??C)       PHYSICAL EXAM:   GENERAL APPEARANCE: Alert & oriented x3, pleasant. Well developed, morbidly obese. Presents today alone. Patient has an antalgic gait.   PSYCH: appropriate mood and affect    DERM: (all measurements are in cm, unless otherwise noted)  General: Patient has large and deep skin folds of the abdomen. There is dusky erythematous skin with studded with nodules with malodorous, glistening yellow-white exudate involving the lower abdomen, pannus, pubic region and upper inner thighs.  There is maceration and breakdown in these skin folds. Skin of pannus is tender to palpation and indurated.    Assessment/Plan      Diagnosis Orders   1. Abdominal panniculus, symptomatic  [facility]   2. Cellulitis and abscess of trunk  [facility]   3. Candidiasis, cutaneous  [facility]        1. Abdominal panniculus, symptomatic  - [facility]  I have consulted with Dr. Maryagnes AmosLeone (referring provider, wound care) and he is agreeable that patient likely needs inpatient intervention. Patient has been advised to go to the nearest ED, preferably a location with infectious disease care. I have [facility]. Patient is agreeable with plan.     2. Cellulitis and abscess of trunk  - [facility]  See #1  Recommend patient start cleansing the skin with baby shampoo and thoroughly drying the skin folds. She will likely need to continue care with dermatology or wound care. If this is ongoing,  patient is likely a candidate for bariatric surgery.     3. Candidiasis, cutaneous  - [facility]  See #1       Orders Placed This Encounter   Procedures   ??? [facility]       Follow up: Return if symptoms worsen or fail to improve. Pt advised to seek immediate medical attention at the ED. Follow up here or with wound care once treated.                  Montel Culver, PA-C  4:12 PM  01/05/18    (Electronically Signed)

## 2018-01-12 NOTE — Telephone Encounter (Signed)
Name of Caller: Deloria LairSamantha    Contact phone number: [phone]    Relationship to Patient: patient    Provider: Izola PriceMyers    Practice:  Dermatology    Chief Complaint/Reason for Call: Pt called regarding her referral for Infectious Disease. Pt needs referral to be faxed to [facility], fax [phone].     Best time of day caller can be reached: Any       Patient advised that office/PCP has 24-48 business hours to return their call: Yes

## 2018-01-12 NOTE — Telephone Encounter (Signed)
Referral faxed.

## 2018-02-03 ENCOUNTER — Encounter: Admit: 2018-02-03 | Discharge: 2018-02-03 | Primary: Adult Health

## 2018-07-06 IMAGING — CR CHEST
2 series · 2 of 2 positions shown · non-contrast
Comparison: none

[chest pa x-wise]
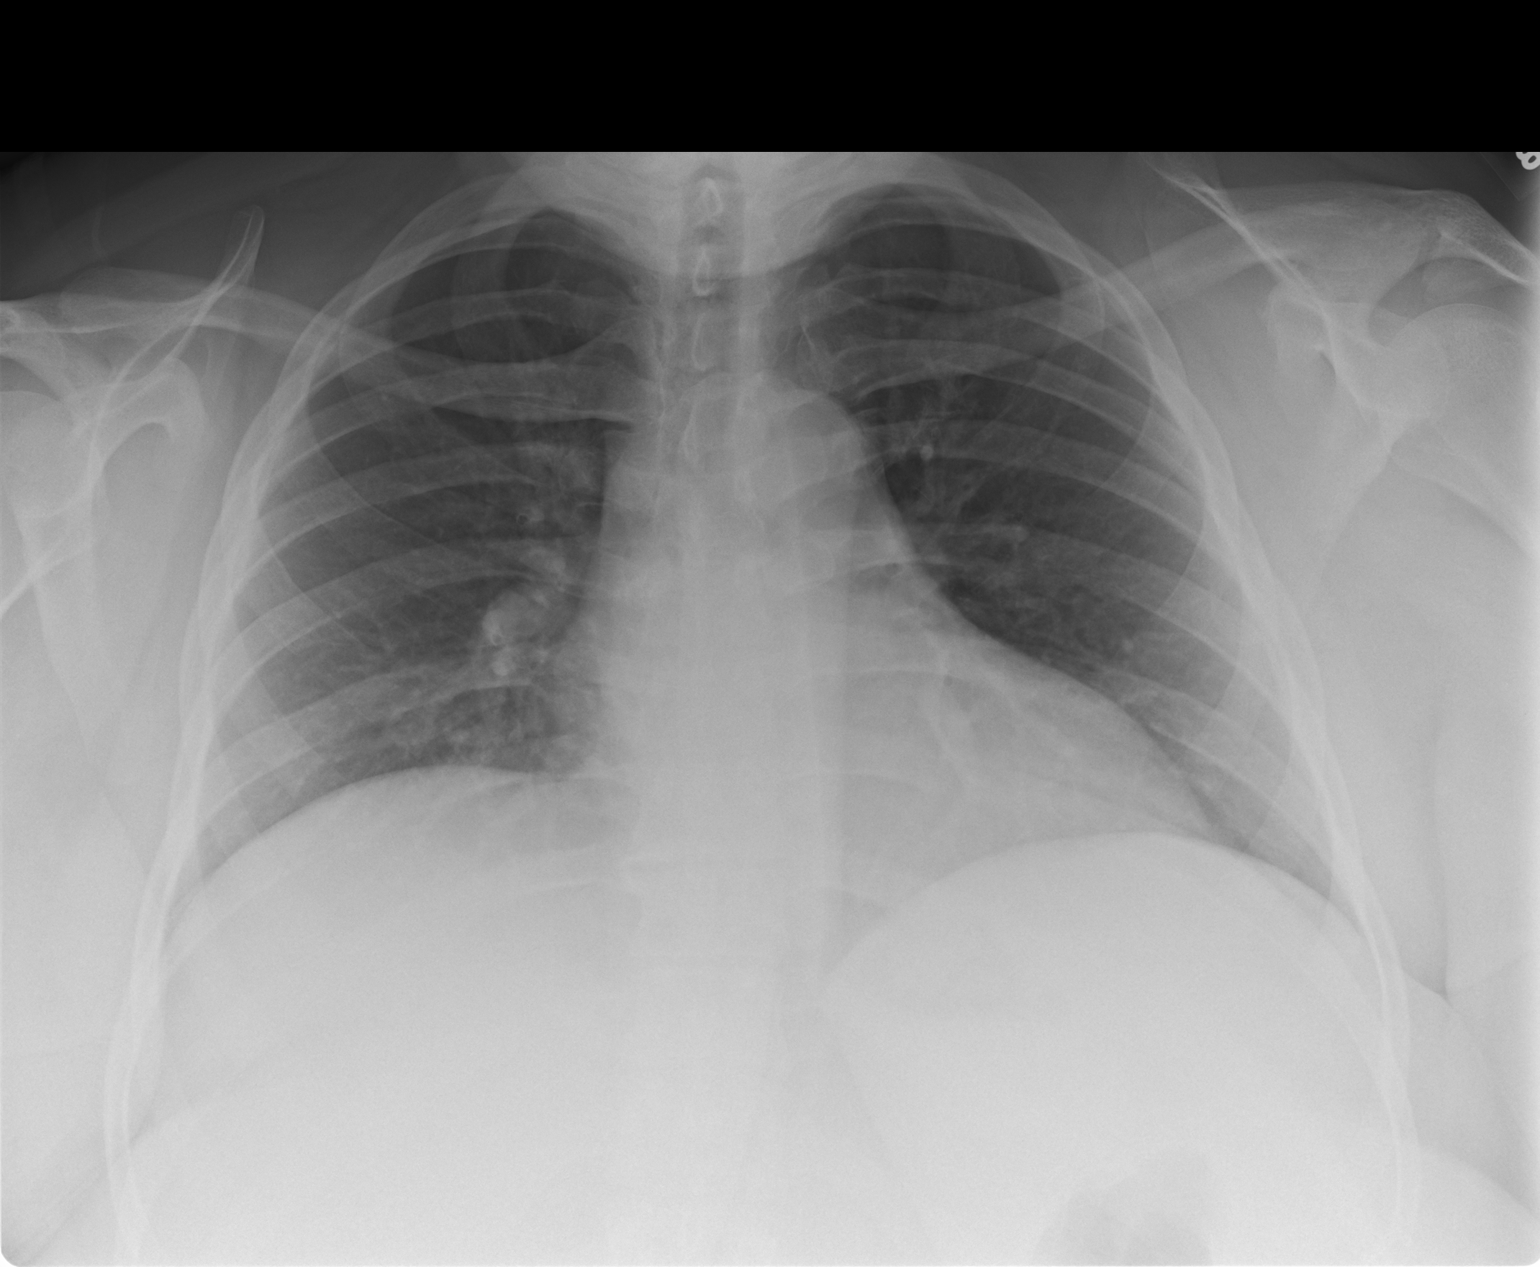

[chest lat]
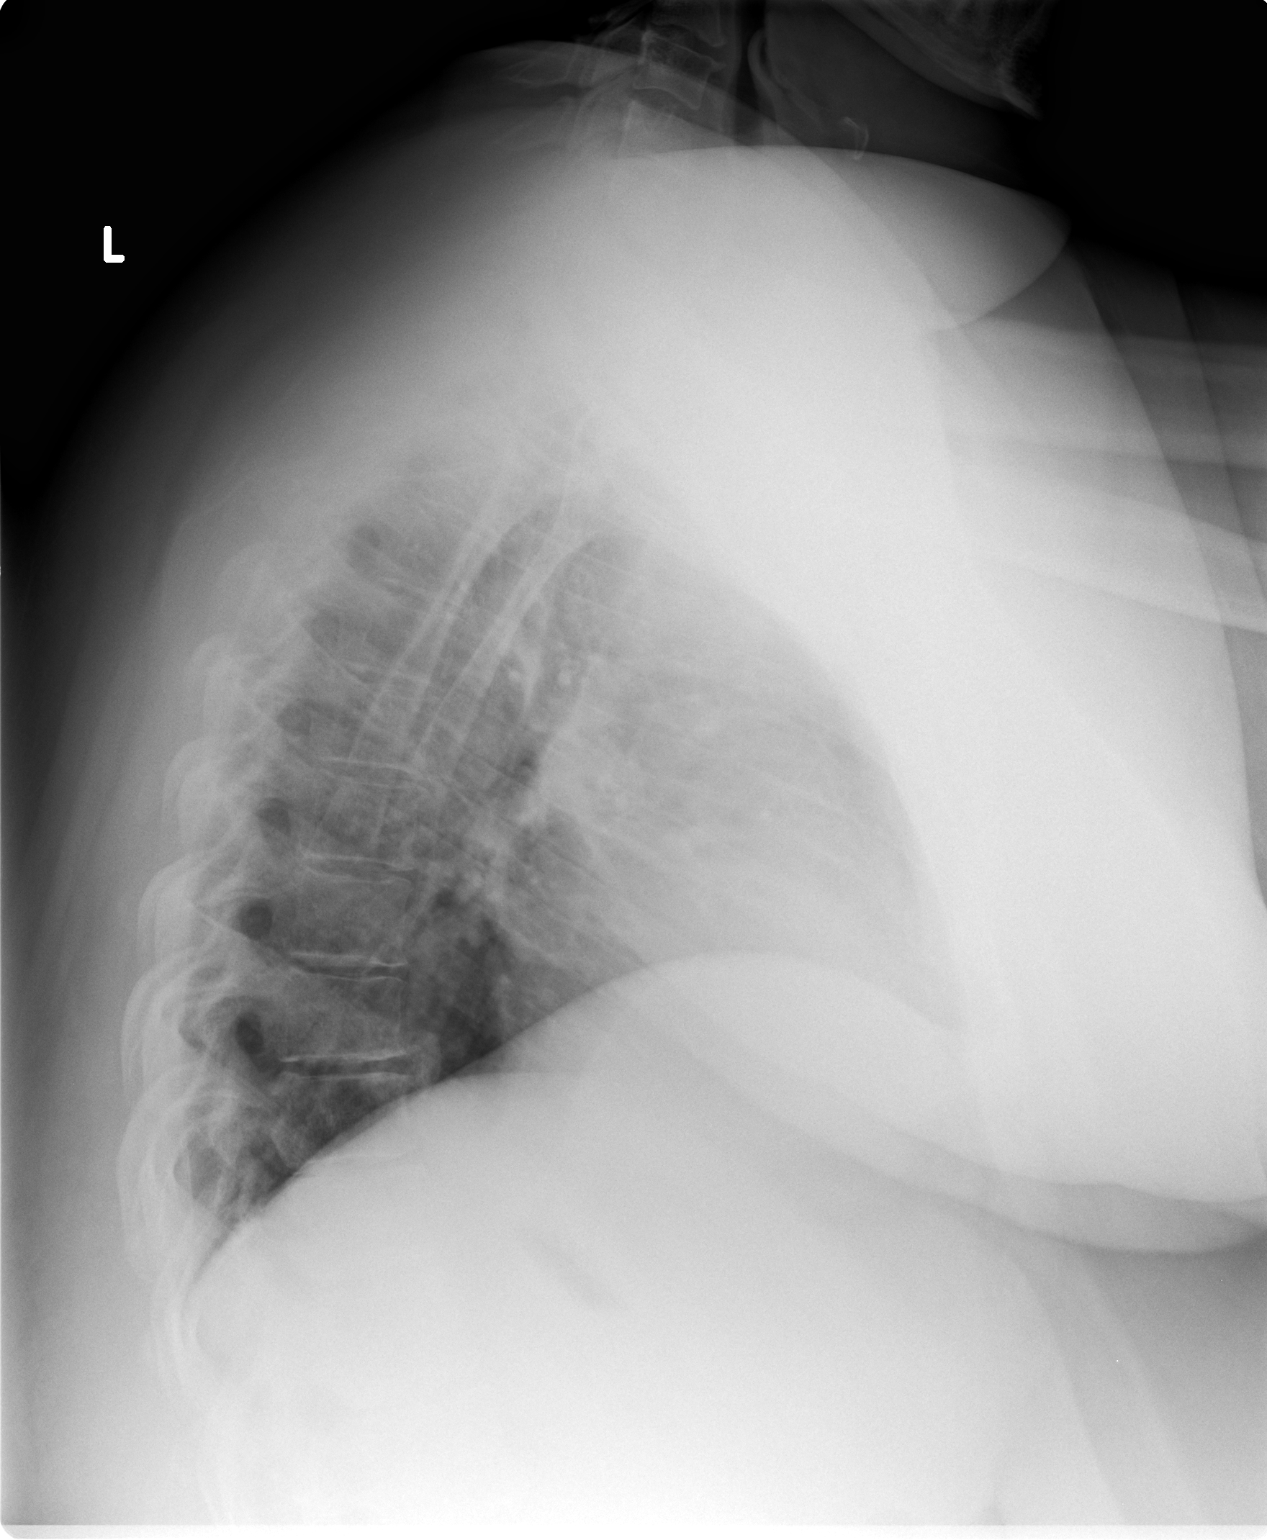

[2 of 2 positions shown; findings below may reference images not displayed]

EXAM

Chest x-ray.

INDICATION

fever
PT STATES FEVER X [id number]. COUGH X 1DAY. HX OF HYPERTENSION. DENIED
PREGNANCY. SHIELDED. SB

FINDINGS

Two views of the chest were obtained.

The lungs are clear. The heart size and pulmonary vascularity are normal.

IMPRESSION

Normal radiographic appearance of the chest.

## 2018-09-24 ENCOUNTER — Inpatient Hospital Stay
Admit: 2018-09-24 | Discharge: 2018-09-24 | Disposition: A | Discharge status: Home / Self Care | Payer: PRIVATE HEALTH INSURANCE | Attending: Emergency Medicine

## 2018-09-24 DIAGNOSIS — I160 Hypertensive urgency: Secondary | ICD-10-CM

## 2018-09-24 LAB — BASIC METABOLIC PANEL
Anion Gap: 8 NA
BUN: 19 mg/dL (ref 7–20)
CO2: 23 mmol/L (ref 22–30)
Calcium: 9.1 mg/dL (ref 8.4–10.4)
Chloride: 103 mmol/L (ref 98–107)
Creatinine: 0.83 mg/dL (ref 0.52–1.25)
EGFR IF NonAfrican American: >90 mL/min (ref >60)
Glucose: 126 mg/dL — ABNORMAL HIGH (ref 70–100)
Potassium: 4.4 mmol/L (ref 3.5–5.1)
Sodium: 134 mmol/L — ABNORMAL LOW (ref 135–145)
eGFR African American: >90 mL/min (ref >60)

## 2018-09-24 LAB — CBC WITH AUTO DIFFERENTIAL
Absolute Baso #: 0.1 10*3/uL (ref 0.0–0.2)
Absolute Baso #: 0.1 10*3/uL (ref 0.0–0.2)
Absolute Eos #: 0 10*3/uL (ref 0.0–0.5)
Absolute Eos #: 0 10*3/uL (ref 0.0–0.5)
Absolute Lymph #: 2.6 10*3/uL (ref 1.0–4.3)
Absolute Lymph #: 1.3 10*3/uL (ref 1.0–4.3)
Absolute Mono #: 0.5 10*3/uL (ref 0.0–0.8)
Absolute Mono #: 0.2 10*3/uL (ref 0.0–0.8)
Absolute Neut #: 6.1 10*3/uL (ref 1.8–7.0)
Absolute Neut #: 6.9 10*3/uL (ref 1.8–7.0)
Basophils: 0.7 % (ref 0.0–2.0)
Basophils: 0.6 % (ref 0.0–2.0)
Eosinophils: 0.2 % — ABNORMAL LOW (ref 1.0–6.0)
Eosinophils: 0.3 % — ABNORMAL LOW (ref 1.0–6.0)
Granulocytes %: 66 % (ref 40.0–80.0)
Granulocytes %: 81.4 % — ABNORMAL HIGH (ref 40.0–80.0)
Hematocrit: 39.1 % (ref 35.0–47.0)
Hematocrit: 37.3 % (ref 35.0–47.0)
Hemoglobin: 13.3 g/dL (ref 11.7–16.0)
Hemoglobin: 12.6 g/dL (ref 11.7–16.0)
Lymphocyte %: 27.9 % (ref 20.0–40.0)
Lymphocyte %: 15.2 % — ABNORMAL LOW (ref 20.0–40.0)
MCH: 28.9 pg (ref 26.0–34.0)
MCH: 29.1 pg (ref 26.0–34.0)
MCHC: 34 % (ref 32.0–36.0)
MCHC: 33.9 % (ref 32.0–36.0)
MCV: 84.8 fL (ref 79.0–98.0)
MCV: 86 fL (ref 79.0–98.0)
MPV: 8.4 fL (ref 7.4–10.4)
MPV: 8.3 fL (ref 7.4–10.4)
Monocytes: 5.2 % (ref 2.0–10.0)
Monocytes: 2.5 % (ref 2.0–10.0)
Platelets: 277 10*3/uL (ref 140–440)
Platelets: 258 10*3/uL (ref 140–440)
RBC: 4.61 10*6/uL (ref 3.80–5.20)
RBC: 4.33 10*6/uL (ref 3.80–5.20)
RDW: 13.6 % (ref 11.5–14.5)
RDW: 13.8 % (ref 11.5–14.5)
WBC: 9.2 10*3/uL (ref 3.6–10.7)
WBC: 8.5 10*3/uL (ref 3.6–10.7)

## 2018-09-24 LAB — URINALYSIS
Bacteria, UA: NEGATIVE /HPF
Bilirubin Urine: NEGATIVE mg/dL
Glucose, Ur: NORMAL mg/dL (ref <70)
Hyaline Casts, UA: NEGATIVE /LPF
Ketones, Urine: NEGATIVE mg/dL
LEUKOCYTES, UA: NEGATIVE Leu/uL
Nitrite, Urine: NEGATIVE NA
Occult Blood,Urine: 0.1 mg/dL — AB
Specific Gravity, Urine: >1.03 NA — AB (ref 1.005–1.030)
Total Protein, Urine: 100 mg/dL — AB
Urobilinogen, Urine: NORMAL mg/dL (ref 0–1)
pH, Urine: 6 NA (ref 5.0–8.0)

## 2018-09-24 LAB — POCT GLUCOSE
POC Glucose: 222 mg/dL — ABNORMAL HIGH (ref 70–100)
POC Glucose: 246 mg/dL — ABNORMAL HIGH (ref 70–100)
POC Glucose: 100 mg/dL (ref 70–100)

## 2018-09-24 LAB — COMPREHENSIVE METABOLIC PANEL
ALT: 31 U/L (ref 0–34)
AST: 36 U/L (ref 15–46)
Albumin,Serum: 4.5 g/dL (ref 3.5–5.0)
Alkaline Phosphatase: 101 U/L (ref 38–126)
Anion Gap: 5 NA
BUN: 20 mg/dL (ref 7–20)
CO2: 26 mmol/L (ref 22–30)
Calcium: 9.3 mg/dL (ref 8.4–10.4)
Chloride: 103 mmol/L (ref 98–107)
Creatinine: 0.83 mg/dL (ref 0.52–1.25)
EGFR IF NonAfrican American: >90 mL/min (ref >60)
Glucose: 103 mg/dL — ABNORMAL HIGH (ref 70–100)
Potassium: 4.1 mmol/L (ref 3.5–5.1)
Sodium: 134 mmol/L — ABNORMAL LOW (ref 135–145)
Total Bilirubin: 0.2 mg/dL (ref 0.2–1.3)
Total Protein: 8.1 g/dL (ref 6.3–8.2)
eGFR African American: >90 mL/min (ref >60)

## 2018-09-24 LAB — URINE DRUG SCREEN
Amphetamines, urine: NEGATIVE NA
Barbiturates, Urine: NEGATIVE NA
Benzodiazepine Ur Qual: NEGATIVE NA
Cocaine Metabolites, Ur: NEGATIVE NA
Methadone, Urine: NEGATIVE NA
Opiates, Urine: NEGATIVE NA
Oxycodone Screen, Ur: NEGATIVE NA
PCP, Urine: NEGATIVE NA

## 2018-09-24 LAB — CORTISOL TOTAL: Cortisol: 6.8 ug/dL

## 2018-09-24 LAB — TROPONIN
Troponin I: 0.036 ng/mL — ABNORMAL HIGH (ref 0.000–0.034)
Troponin I: 0.044 ng/mL — ABNORMAL HIGH (ref 0.000–0.034)
Troponin I: 0.045 ng/mL — ABNORMAL HIGH (ref 0.000–0.034)
Troponin I: 0.026 ng/mL (ref 0.000–0.034)
Troponin I: 0.027 ng/mL (ref 0.000–0.034)

## 2018-09-24 LAB — TSH: TSH: 1.402 u[IU]/mL (ref 0.465–4.680)

## 2018-09-24 LAB — ADD ON LAB TEST

## 2018-09-24 LAB — LIPASE: Lipase: 192 U/L (ref 23–300)

## 2018-09-24 LAB — D-DIMER, QUANTITATIVE: D-Dimer, Quant: 0.54 mg/L — ABNORMAL HIGH (ref 0.00–0.50)

## 2018-09-24 MED ORDER — PANTOPRAZOLE SODIUM 40 MG PO TBEC
40 MG | Freq: Every day | ORAL | Status: DC
Start: 2018-09-24 — End: 2018-09-26
  Administered 2018-09-24 – 2018-09-26 (×3): 40 mg via ORAL

## 2018-09-24 MED ORDER — CARVEDILOL 25 MG PO TABS
25 MG | Freq: Two times a day (BID) | ORAL | Status: DC
Start: 2018-09-24 — End: 2018-09-26
  Administered 2018-09-24 – 2018-09-26 (×5): 25 mg via ORAL

## 2018-09-24 MED ORDER — NORMAL SALINE FLUSH 0.9 % IV SOLN
0.9 | INTRAVENOUS | Status: DC | PRN
Start: 2018-09-24 — End: 2018-09-26

## 2018-09-24 MED ORDER — LABETALOL HCL 5 MG/ML IV SOLN
5 MG/ML | Freq: Once | INTRAVENOUS | Status: AC
Start: 2018-09-24 — End: 2018-09-24
  Administered 2018-09-24: 08:00:00 20 mg via INTRAVENOUS

## 2018-09-24 MED ORDER — LABETALOL HCL 5 MG/ML IV SOLN
5 MG/ML | Freq: Four times a day (QID) | INTRAVENOUS | Status: DC | PRN
Start: 2018-09-24 — End: 2018-09-25
  Administered 2018-09-24: 11:00:00 10 mg via INTRAVENOUS

## 2018-09-24 MED ORDER — ACETAMINOPHEN 325 MG PO TABS
325 MG | Freq: Four times a day (QID) | ORAL | Status: DC | PRN
Start: 2018-09-24 — End: 2018-09-26
  Administered 2018-09-24 – 2018-09-26 (×5): 650 mg via ORAL

## 2018-09-24 MED ORDER — POLYETHYLENE GLYCOL 3350 17 G PO PACK
17 g | Freq: Every day | ORAL | Status: DC | PRN
Start: 2018-09-24 — End: 2018-09-26

## 2018-09-24 MED ORDER — AMLODIPINE BESYLATE 5 MG PO TABS
5 MG | Freq: Every day | ORAL | Status: DC
Start: 2018-09-24 — End: 2018-09-26
  Administered 2018-09-24 – 2018-09-26 (×3): 10 mg via ORAL

## 2018-09-24 MED ORDER — LISINOPRIL 20 MG PO TABS
20 MG | Freq: Every day | ORAL | Status: DC
Start: 2018-09-24 — End: 2018-09-26
  Administered 2018-09-24 – 2018-09-26 (×3): 20 mg via ORAL

## 2018-09-24 MED ORDER — PREDNISONE 20 MG PO TABS
20 MG | Freq: Once | ORAL | Status: AC
Start: 2018-09-24 — End: 2018-09-24
  Administered 2018-09-24: 07:00:00 40 mg via ORAL

## 2018-09-24 MED ORDER — ENOXAPARIN SODIUM 40 MG/0.4ML SC SOLN
40 | Freq: Every day | SUBCUTANEOUS | Status: DC
Start: 2018-09-24 — End: 2018-09-26
  Administered 2018-09-24 – 2018-09-26 (×3): 40 mg via SUBCUTANEOUS

## 2018-09-24 MED ORDER — GLUCOSE 40 % PO GEL
40 % | ORAL | Status: DC | PRN
Start: 2018-09-24 — End: 2018-09-26

## 2018-09-24 MED ORDER — DEXTROSE 5 % IV SOLN
5 | INTRAVENOUS | Status: DC | PRN
Start: 2018-09-24 — End: 2018-09-26

## 2018-09-24 MED ORDER — INSULIN NPH ISOPHANE & REGULAR (70-30) 100 UNIT/ML SC SUSP
Freq: Two times a day (BID) | SUBCUTANEOUS | Status: DC
Start: 2018-09-24 — End: 2018-09-26
  Administered 2018-09-24 – 2018-09-26 (×3): 100 [IU] via SUBCUTANEOUS

## 2018-09-24 MED ORDER — NORMAL SALINE FLUSH 0.9 % IV SOLN
0.9 % | INTRAVENOUS | Status: DC | PRN
Start: 2018-09-24 — End: 2018-09-26

## 2018-09-24 MED ORDER — GLUCAGON HCL RDNA (DIAGNOSTIC) 1 MG IJ SOLR
1 MG | INTRAMUSCULAR | Status: DC | PRN
Start: 2018-09-24 — End: 2018-09-26

## 2018-09-24 MED ORDER — ACETAMINOPHEN 650 MG RE SUPP
650 MG | Freq: Four times a day (QID) | RECTAL | Status: DC | PRN
Start: 2018-09-24 — End: 2018-09-26

## 2018-09-24 MED ORDER — NORMAL SALINE FLUSH 0.9 % IV SOLN
0.9 | Freq: Two times a day (BID) | INTRAVENOUS | Status: DC
Start: 2018-09-24 — End: 2018-09-26
  Administered 2018-09-24 – 2018-09-26 (×3): 10 mL via INTRAVENOUS

## 2018-09-24 MED ORDER — INSULIN LISPRO 100 UNIT/ML SC SOLN
100 UNIT/ML | Freq: Three times a day (TID) | SUBCUTANEOUS | Status: DC
Start: 2018-09-24 — End: 2018-09-26
  Administered 2018-09-24 – 2018-09-26 (×4): 25 [IU] via SUBCUTANEOUS

## 2018-09-24 MED ORDER — DEXTROSE 50 % IV SOLN
50 % | INTRAVENOUS | Status: DC | PRN
Start: 2018-09-24 — End: 2018-09-26

## 2018-09-24 MED ORDER — PERFLUTREN LIPID MICROSPHERE IV SUSP
Freq: Once | INTRAVENOUS | Status: DC | PRN
Start: 2018-09-24 — End: 2018-09-26

## 2018-09-24 MED ORDER — DIPHENHYDRAMINE HCL 25 MG PO TABS
25 MG | Freq: Once | ORAL | Status: AC | PRN
Start: 2018-09-24 — End: 2018-09-24
  Administered 2018-09-24: 07:00:00 50 mg via ORAL

## 2018-09-24 MED ORDER — HYDRALAZINE HCL 20 MG/ML IJ SOLN
20 MG/ML | Freq: Four times a day (QID) | INTRAMUSCULAR | Status: DC | PRN
Start: 2018-09-24 — End: 2018-09-26
  Administered 2018-09-24 – 2018-09-25 (×2): 10 mg via INTRAVENOUS

## 2018-09-24 MED FILL — ACETAMINOPHEN 325 MG PO TABS: 325 MG | ORAL | Qty: 2

## 2018-09-24 MED FILL — AMLODIPINE BESYLATE 5 MG PO TABS: 5 mg | ORAL | Qty: 2

## 2018-09-24 MED FILL — CARVEDILOL 25 MG PO TABS: 25 mg | ORAL | Qty: 1

## 2018-09-24 MED FILL — LISINOPRIL 20 MG PO TABS: 20 mg | ORAL | Qty: 1

## 2018-09-24 MED FILL — LABETALOL HCL 5 MG/ML IV SOLN: 5 MG/ML | INTRAVENOUS | Qty: 20

## 2018-09-24 MED FILL — PANTOPRAZOLE SODIUM 40 MG PO TBEC: 40 MG | ORAL | Qty: 1

## 2018-09-24 MED FILL — BANOPHEN 25 MG PO TABS: 25 mg | ORAL | Qty: 2

## 2018-09-24 MED FILL — LABETALOL HCL 5 MG/ML IV SOLN: 5 mg/mL | INTRAVENOUS | Qty: 4

## 2018-09-24 MED FILL — LABETALOL HCL 5 MG/ML IV SOLN: 5 mg/mL | INTRAVENOUS | Qty: 20

## 2018-09-24 MED FILL — HYDRALAZINE HCL 20 MG/ML IJ SOLN: 20 MG/ML | INTRAMUSCULAR | Qty: 1

## 2018-09-24 MED FILL — PREDNISONE 20 MG PO TABS: 20 mg | ORAL | Qty: 2

## 2018-09-24 MED FILL — ENOXAPARIN SODIUM 40 MG/0.4ML SC SOLN: 40 MG/0.4ML | SUBCUTANEOUS | Qty: 0.4

## 2018-09-24 NOTE — Care Coordination-Inpatient (Signed)
Discharge Planning  Current Residence: Private Residence  Living Arrangements: Spouse/Significant Other, Children  Support Systems: Spouse/Significant Other, Parent, Family Members, Friends/Neighbors  Current Services Prior To Admission: None  Potential Assistance Needed: N/A  Potential Assistance Purchasing Medications: No  Meds-to-Beds: Does the patient want to have any new prescriptions delivered to bedside prior to discharge?: Yes  Type of Home Care Services: None  Patient expects to be discharged to:: Home  Expected Discharge Date: 09/25/18  Follow Up Appointment: Best Day/Time : (any)      Emergency Contacts  Contact 1: Name: Pervis Hocking  Contact 1: Number: [phone]  Contact 1: Relationship: Dad    Social/Functional History  Lives With: Significant other, Daughter  Type of Home: Apartment  Home Layout: Multi-level  Home Access: Stairs to enter with rails  Entrance Stairs - Number of Steps: 5 flights of steps to get to apartment  Bathroom Shower/Tub: Medical sales representative: Midwife: (n/a)  Bathroom Accessibility: Accessible  Home Equipment: (n/a)  Receives Help From: Family, Friend(s)  ADL Assistance: Independent  Homemaking Assistance: Independent  Homemaking Responsibilities: Yes  Ambulation Assistance: Independent  Transfer Assistance: Independent  Active Driver: Yes  Mode of Transportation: Car  Occupation: On disability  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  Discharge preparation checklist given and reviewed.  Pt is OBS explained. Pt came to ER with c/o shortness of breath.  Took her migraine medication and her face became flush, short of breath, H/A and sweaty.  Needs an echo and renal artery duplex.  Pt has insurance with prescription coverage.  Has not been in a facility in past 14 days.  Plan is home, will have a ride.  Will follow while in hospital. .Electronically signed by Loran Senters, RN on  09/24/2018 at 11:52 AM

## 2018-09-24 NOTE — ED Provider Notes (Signed)
[facility] EMERGENCY DEPT  EMERGENCY DEPARTMENT ENCOUNTER      Pt Name: Sierra [name]  MRN: [id number]  Birthdate [birth year]  Date of evaluation: 09/24/2018  Provider: Lamonte Sakai, MD     CHIEF COMPLAINT       Chief Complaint   Patient presents with   . Shortness of Breath     pt took migraine medicine, sumatriptan, 1 hour ago adn pt become red in teh face, SOB, HA, and sweaty. pt denies medication allergies but has seasonal allergies. denies chest pain.          HISTORY OF PRESENT ILLNESS   (Location/Symptom, Timing/Onset,Context/Setting, Quality, Duration, Modifying Factors, Severity) Note limiting factors.   HPI    [name] is a 38 y.o. female who presents to the emergency department for the evaluation of shortness of breath.  Patient she is having sensation of tightness in the throat and scratchiness.  States that she took a sumatriptan pill one hour ago and became red in the face felt a headache and felt sweaty.  History of migraines.  No history of blood clots.  She has a severe history of hypertension for which she is on 3 agents for blood pressure control.    She did report some chest tightness sensation like someone was sitting across her chest though did deny chest pain for triage.      Nursing Notes were reviewed.    REVIEW OF SYSTEMS    (2+ for level 4; 10+ for level 5)   Review of Systems  10 system review of systems negative except as perhistory of present illness.    PAST MEDICAL HISTORY   History reviewed. No pertinent past medical history.    SURGICAL HISTORY     History reviewed. No pertinent surgical history.    CURRENT MEDICATIONS       Previous Medications    AMLODIPINE (NORVASC) 10 MG TABLET    Take 10 mg by mouth    CARVEDILOL (COREG) 25 MG TABLET    Take 25 mg by mouth    LISINOPRIL (PRINIVIL;ZESTRIL) 20 MG TABLET    Take 20 mg by mouth    LISINOPRIL (PRINIVIL;ZESTRIL) 5 MG TABLET    Take 5 mg by mouth    METFORMIN (GLUCOPHAGE) 1000 MG TABLET        METHOCARBAMOL (ROBAXIN) 750 MG  TABLET        OMEPRAZOLE (PRILOSEC) 40 MG DELAYED RELEASE CAPSULE    TAKE 1 CAPSULE BY MOUTH ONCE DAILY 30 MINUTES BEFORE MORNING MEAL    OXAPROZIN (DAYPRO) 600 MG TABLET    Take 600 mg by mouth    RANITIDINE (ZANTAC) 150 MG TABLET    Take 150 mg by mouth       ALLERGIES     Patient has no known allergies.    FAMILY HISTORY     History reviewed. No pertinent family history.     SOCIALHISTORY       Social History     Socioeconomic History   . Marital status: Divorced     Spouse name: None   . Number of children: None   . Years of education: None   . Highest education level: None   Occupational History   . None   Social Needs   . Financial resource strain: None   . Food insecurity     Worry: None     Inability: None   . Transportation needs     Medical: None     Non-medical:  None   Tobacco Use   . Smoking status: None   Substance and Sexual Activity   . Alcohol use: None   . Drug use: None   . Sexual activity: None   Lifestyle   . Physical activity     Days per week: None     Minutes per session: None   . Stress: None   Relationships   . Social Wellsite geologist on phone: None     Gets together: None     Attends religious service: None     Active member of club or organization: None     Attends meetings of clubs or organizations: None     Relationship status: None   . Intimate partner violence     Fear of current or ex partner: None     Emotionally abused: None     Physically abused: None     Forced sexual activity: None   Other Topics Concern   . None   Social History Narrative   . None       SCREENINGS           PHYSICAL EXAM    (up to 7 for level 4, 8 or more for level 5)     ED Triage Vitals [09/24/18 0130]   BP Temp Temp Source Pulse Resp SpO2 Height Weight   (!) 252/154 97.3 F (36.3 C) Oral 112 21 96 % 5\' 6"  (1.676 m) (!) 375 lb (170.1 kg)       Physical Exam    General Appearance:  Alert, cooperative, no distress, appears stated age.   Head:  Normocephalic, without obvious abnormality, atraumatic.   Eyes:   conjunctiva/corneas clear, EOM's intact.  Sclera anicteric.   ENT: Mucous membranes moist.   Neck: Supple, symmetrical, trachea midline,  No jugular venous distention.     Lungs:   No Respiratory Distress.   Chest Wall:  Nontender    Heart:  RRR   Abdomen:   Soft, non-distended, non-tender   Extremities:  Full range of motion.   Pulses: Equal bilaterally in bilateral upper extremities    Skin:  No rashes or lesions to exposed skin.   Neurologic: Alert and oriented X 4/4.   Motor grossly normal.  Speech clear.          DIAGNOSTIC RESULTS     EKG (Per Emergency Physician):   12-lead, sinus tachycardia, heart rate 109.  No ST segment changes or T-wave inversions suggestive of acute ischemia.    RADIOLOGY (Per EmergencyPhysician):       Interpretation per the Radiologist below, if available at the time of this note:  Cta Chest W Wo  (pe Study)    Result Date: 09/24/2018  Patient Name:  DYLYN, DREWEK MRN:  [id number] FIN:  [id number] ---CT--- Exam Date/Time        09/24/2018 04:21:43 EDT                              Exam                  CTA Chest w/ + w/o Contrast                          Ordering Physician    [phone] [facility], Joseff Luckman  Accession Number      [id number]                                        CPT4 Codes 98119 (),  [phone] (CT ISOVUE 370MG /ML&[id number]&ML&1) Reason For Exam shortness of breath, chest pain, elevated d dimer Report Clinical indication:  Chest pain, shortness of breath and elevated d-dimer Contrast: 75 mL Isovue-370 intravenously Radiation dose:  DLP 783 mGycm Multidetector CT was performed with rapid bolus intravenous administration of contrast material.  Axial and coronal reformatted images were evaluated.  I performed 3-D reconstruction at the workstation. Contrast bolus is relatively delineated and not ideal and the pulmonary arteries but adequate for evaluation of central vessels. There are no focal filling defects noted within the opacified pulmonary  arterial system, with peripheral branch vessels limited in evaluation by the diluted contrast bolus.  No abrupt termination is noted in the contrast column within the pulmonary arteries. No pathologically enlarged mediastinal or hilar lymph nodes are identified.  No mediastinal masses are identified.  Aorta is normal in size and the lumen enhances uniformly. The lungs contain no focal consolidative pneumonic infiltrates. There is some mosaic density consistent with hypoventilation and the lung volumes are shallow.  No pleural effusions are present. No focal masses are identified within the visualized portions of the liver and spleen and the visualized portions of the adrenal glands appear normal in size. Bridging endplate osteophytes are noted in mid and lower thoracic spine. Impression:  No CT evidence of pulmonary embolism. Report Dictated on Workstation: [id number] ---** Final ---** Dictated: 09/24/2018 4:30 am Dictating Physician: Lennice Sites, MD, DIANE Signed Date and Time: 09/24/2018 4:33 am Signed by: Lennice Sites, MD, DIANE Transcribed Date and Time: 09/24/2018 4:30    Xr Chest Portable    Result Date: 09/24/2018  Patient Name:  GEORGETTA, SIMAR MRN:  [id number] FIN:  [id number] ---Diagnostic Radiology--- Exam Date/Time        09/24/2018 02:55:59 EDT                             Exam                  CR Chest Portable                                   Ordering Physician    [phone] [facility], Aiman Sonn                              Accession Number      [id number]                                       CPT4 Codes 44034 () Reason For Exam shortness of breath Report PORTABLE CHEST  Clinical indication:  Shortness of breath Comparison:  None The cardiac silhouette and mediastinal contours are not enlarged.  The lungs show no edema or focal acute infiltrates.  No pleural effusions are identified. IMPRESSION: NO ACUTE RADIOGRAPHIC ABNORMALITY IS NOTED IN THE CHEST. Report Dictated on Workstation: [id number] ---** Final ---**  Dictated: 09/24/2018 2:54 am Dictating Physician: Lennice Sites, MD, DIANE Signed Date and Time: 09/24/2018 2:54 am Signed  by: Lennice Sites, MD, DIANE Transcribed Date and Time: 09/24/2018 2:54      ED BEDSIDE ULTRASOUND:   Performed by ED Physician - none    LABS:  Labs Reviewed   CBC WITH AUTO DIFFERENTIAL - Abnormal; Notable for the following components:       Result Value    Eosinophils 0.2 (*)     All other components within normal limits    Narrative:     Test Performed by [facility], [address] [place], [place]  [address]   COMPREHENSIVE METABOLIC PANEL - Abnormal; Notable for the following components:    Sodium 134 (*)     Glucose 103 (*)     All other components within normal limits    Narrative:     Test Performed by [facility], [address], [place], [place]  [address]   TROPONIN - Abnormal; Notable for the following components:    Troponin I 0.036 (*)     All other components within normal limits    Narrative:     Test Performed by [facility], [address], [place], [place]  [address]   D-DIMER, QUANTITATIVE - Abnormal; Notable for the following components:    D-Dimer, Quant 0.54 (*)     All other components within normal limits    Narrative:     Test Performed by [facility] [address], [place], [place]  [address]   LIPASE    Narrative:     Test Performed by [facility], [address], [place], [place]  [address]   TROPONIN        All otherlabs were within normal range or not returned as of this dictation.    EMERGENCY DEPARTMENT COURSE and DIFFERENTIAL DIAGNOSIS/MDM:   Vitals:    Vitals:    09/24/18 0130 09/24/18 0319 09/24/18 0429 09/24/18 0508   BP: (!) 252/154 (!) 210/139 (!) 207/126 (!) 193/120   Pulse: 112 102 95 94   Resp: 21 18 18 18    Temp: 97.3 F (36.3 C)      TempSrc: Oral      SpO2: 96% 97% 96% 96%   Weight: (!) 170.1 kg (375 lb)      Height: 5\' 6"  (1.676 m)          Medications   predniSONE (DELTASONE) tablet 40 mg (40 mg Oral Given 09/24/18 0233)   diphenhydrAMINE (BENADRYL)  tablet 50 mg (50 mg Oral Given 09/24/18 0238)   labetalol (NORMODYNE;TRANDATE) injection 20 mg (20 mg Intravenous Given 09/24/18 0428)       MDM.  D-dimer was mildly elevated.  This was checked because she had the chest tightness and a sinus tachycardia of 109.  She was given Benadryl and did appear to have symptomatic relief with that.  CT PE study shows no acute findings.  Troponin was very mildly elevated however.  Given her initial blood pressures in the 200s systolic, concern for hypertensive emergency with end organ dysfunction.  For this reason, 20 mg of labetalol was given IV.  Blood pressure did improve into the 190s systolic.  She is not having any chest discomfort at this time.  Shortness of breath improved.  Heart rate is down to 94.  I believe the patient is stable for a observation admission for hypertension management.  Troponin recheck.    REVAL:          CRITICAL CARE TIME   Total Critical Care time was 0 minutes,  excluding separately reportable procedures.  There was a high probability of clinically significant/life threatening deterioration in thepatient's condition which required my urgent intervention.     CONSULTS:  None    PROCEDURES:  Unless otherwise noted below, none    Procedures    PPE  Surgical mask with a shield used at all times by myself, patient was in a dust mask or surgical mask throughout entire evaluation.     FINAL IMPRESSION      1. Hypertensive urgency          DISPOSITION/PLAN   DISPOSITION Admitted 09/24/2018 05:30:32 AM      PATIENT REFERRED TO:  Anson Oregon, DO  [address] [place] [address]  [phone]            DISCHARGE MEDICATIONS:  New Prescriptions    No medications on file          (Please note:  Portions of this note were completed with a voicerecognition program.  Efforts were made to edit the dictations but occasionally words and phrases are mis-transcribed.)  Form [id number]    Lamonte Sakai, MD (electronically signed)  Emergency Medicine  Provider       Lamonte Sakai, MD  09/24/18 [phone]

## 2018-09-24 NOTE — ED Notes (Signed)
Pt denies questions regarding admission. Pt with mask for transport. Pt alert NAD. Pt placed on cardiac monitoring for transport     Janene Madeira, RN  09/24/18 [phone]

## 2018-09-24 NOTE — Plan of Care (Signed)
Problem: Falls - Risk of:  Goal: Will remain free from falls  Description: Will remain free from falls  09/24/2018 2151 by Suzan Slick. Sakia Schrimpf, RN  Outcome: Ongoing  09/24/2018 0911 by Antony Haste, RN  Outcome: Ongoing  Goal: Absence of physical injury  Description: Absence of physical injury  09/24/2018 2151 by Suzan Slick. Artis Beggs, RN  Outcome: Ongoing  09/24/2018 0911 by Antony Haste, RN  Outcome: Ongoing     Problem: Pain:  Goal: Pain level will decrease  Description: Pain level will decrease  09/24/2018 2151 by Suzan Slick. Talise Sligh, RN  Outcome: Ongoing  09/24/2018 0911 by Antony Haste, RN  Outcome: Ongoing  Goal: Control of acute pain  Description: Control of acute pain  09/24/2018 2151 by Suzan Slick. Iris Hairston, RN  Outcome: Ongoing  09/24/2018 0911 by Antony Haste, RN  Outcome: Ongoing  Goal: Control of chronic pain  Description: Control of chronic pain  09/24/2018 2151 by Suzan Slick. Musab Wingard, RN  Outcome: Ongoing  09/24/2018 0911 by Antony Haste, RN  Outcome: Ongoing

## 2018-09-24 NOTE — H&P (Addendum)
Attending History and Physical    Admit Date: 09/24/2018  PCP: Azalee Course, DO    CHIEF COMPLAINT:  Chest discomfort, shortness of breath.    Reason for Admission:  Hypertensive emergency    History Obtained From:  patient, electronic medical record  I was wearing N95 mask throughout the encounter.  HISTORY OF PRESENT ILLNESS:    Sierra [name] is a 38 y.o. female with past medical history of Obesity, type 2 diabetes, hypertension, migraine headaches who presented to the Emergency Department complaining of sudden onset of shortness of breath, discomfort in the center of the chest 30-45 minutes after she took sumatriptan(describes as heaviness), facial flushing,  Denies  abdominal pain, nausea, vomiting, diarrhea, constipation, fevers, or chills.  Work up in the ED:  Blood pressure was elevated to 52/154, tachycardic at 112, afebrile  troponins elevated at 0.036.    She was given 20 mg of labetalol in the Emergency Department which brought the blood pressure down to 190s.  She was given 1 more dose of labetalol 10 mg, hydralazine 10 mg, blood pressure down to 140/100's    Currently she feels better, still has slight headache, denies any chest pain, shortness of breath.  Past Medical History:    Obesity, type 2 diabetes, hypertension  Past Surgical History:    History reviewed. No pertinent surgical history.  Social History:   Social History     Socioeconomic History   ??? Marital status: Divorced     Spouse name: Not on file   ??? Number of children: Not on file   ??? Years of education: Not on file   ??? Highest education level: Not on file   Occupational History   ??? Not on file   Social Needs   ??? Financial resource strain: Not on file   ??? Food insecurity     Worry: Not on file     Inability: Not on file   ??? Transportation needs     Medical: Not on file     Non-medical: Not on file   Tobacco Use   ??? Smoking status: Former Smoker   ??? Smokeless tobacco: Never Used   Substance and Sexual Activity   ??? Alcohol use: Not on file   ???  Drug use: Not on file   ??? Sexual activity: Not on file   Lifestyle   ??? Physical activity     Days per week: Not on file     Minutes per session: Not on file   ??? Stress: Not on file   Relationships   ??? Social Product manager on phone: Not on file     Gets together: Not on file     Attends religious service: Not on file     Active member of club or organization: Not on file     Attends meetings of clubs or organizations: Not on file     Relationship status: Not on file   ??? Intimate partner violence     Fear of current or ex partner: Not on file     Emotionally abused: Not on file     Physically abused: Not on file     Forced sexual activity: Not on file   Other Topics Concern   ??? Not on file   Social History Narrative   ??? Not on file     Family History:   History reviewed. No pertinent family history.  Medications Prior to Admission:    Medications Prior to  Admission: ranitidine (ZANTAC) 150 MG tablet, Take 150 mg by mouth  oxaprozin (DAYPRO) 600 MG tablet, Take 600 mg by mouth  omeprazole (PRILOSEC) 40 MG delayed release capsule, TAKE 1 CAPSULE BY MOUTH ONCE DAILY 30 MINUTES BEFORE MORNING MEAL  lisinopril (PRINIVIL;ZESTRIL) 20 MG tablet, Take 20 mg by mouth  carvedilol (COREG) 25 MG tablet, Take 25 mg by mouth  amLODIPine (NORVASC) 10 MG tablet, Take 10 mg by mouth  [DISCONTINUED] methocarbamol (ROBAXIN) 750 MG tablet,   [DISCONTINUED] metFORMIN (GLUCOPHAGE) 1000 MG tablet,   [DISCONTINUED] lisinopril (PRINIVIL;ZESTRIL) 5 MG tablet, Take 5 mg by mouth    Allergies:  Patient has no known allergies.  REVIEW OF SYSTEMS:  Constitutional: Negative for fever, chills, activity change and unexpected weight change.   HEENT: Negative for congestion, postnasal drip and sneezing.   Eyes: Negative for itching and visual disturbance.   Respiratory: Negative for apnea, cough, choking, chest tightness, shortness of breath, wheezing and stridor.   Cardiovascular: Negative for chest pain.   Gastrointestinal: Negative for  nausea, vomiting, abdominal pain, diarrhea and blood in stool.   Genitourinary: Negative for dysuria, frequency and flank pain.   Musculoskeletal: Negative for myalgias and joint swelling.   Skin: Negative for rash.   Neurological: Negative for dizziness, tremors, seizures, syncope, facial asymmetry, speech difficulty, weakness, numbness and positive for headaches.   Hematological: Negative for adenopathy.   Psychiatric/Behavioral: Negative for suicidal ideas, behavioral problems, self-injury and dysphoric mood.     Vitals:  BP (!) 212/168    Pulse 103    Temp 97.5 ??F (36.4 ??C) (Temporal)    Resp 18    Ht '5\' 6"'  (1.676 m)    Wt (!) 413 lb 12.8 oz (187.7 kg)    SpO2 95%    BMI 66.79 kg/m??   BMI Classification: Morbidly Obese (>40.0)  Pulse Ox: SpO2  Avg: 95.8 %  Min: 95 %  Max: 97 %  Supplemental O2:      PHYSICAL EXAM:  CONSTITUTIONAL:  awake, alert, cooperative, no apparent distress, and appears stated age, obese  HEENT: Normocephalic, PERRLA  NECK: no JVD, no LAD  HEART: RRR, no murmurs, gallops, or rubs  LUNGS: clear to auscultation bilaterally, no wheezes, crackles, or rhonchi.  ABDOMEN: soft/NT/ND, positive BS  MUSCULOSKELETAL: negative for edema, +2 pulses  SKIN: intact without rash or jaundice  NEURO:  CN intact and no focal deficits    DATA:     Recent Results (from the past 24 hour(s))   Hemogram  (CBC) w/Auto Diff    Collection Time: 09/24/18  2:53 AM   Result Value Ref Range    WBC 9.2 3.6 - 10.7 10*3/uL    RBC 4.61 3.80 - 5.20 10*6/uL    Hemoglobin 13.3 11.7 - 16.0 g/dL    Hematocrit 39.1 35.0 - 47.0 %    MCV 84.8 79.0 - 98.0 fL    MCH 28.9 26.0 - 34.0 pg    MCHC 34.0 32.0 - 36.0 %    RDW 13.6 11.5 - 14.5 %    Platelets 277 140 - 440 10*3/uL    MPV 8.4 7.4 - 10.4 fL    Granulocytes % 66.0 40.0 - 80.0 %    Lymphocyte % 27.9 20.0 - 40.0 %    Monocytes 5.2 2.0 - 10.0 %    Eosinophils 0.2 (L) 1.0 - 6.0 %    Basophils 0.7 0.0 - 2.0 %    Absolute Neut # 6.1 1.8 - 7.0 10*3/uL  Absolute Lymph # 2.6 1.0 - 4.3  10*3/uL    Absolute Mono # 0.5 0.0 - 0.8 10*3/uL    Absolute Eos # 0.0 0.0 - 0.5 10*3/uL    Absolute Baso # 0.1 0.0 - 0.2 10*3/uL   Comprehensive Metabolic Panel    Collection Time: 09/24/18  2:53 AM   Result Value Ref Range    Sodium 134 (L) 135 - 145 mmol/L    Potassium 4.1 3.5 - 5.1 mmol/L    Chloride 103 98 - 107 mmol/L    CO2 26 22 - 30 mmol/L    Anion Gap 5 NA    Glucose 103 (H) 70 - 100 mg/dL    BUN 20 7 - 20 mg/dL    CREATININE 0.83 0.52 - 1.25 mg/dL    eGFR African American >90.0 >60 mL/min    EGFR IF NonAfrican American >90.0 >60 mL/min    Calcium 9.3 8.4 - 10.4 mg/dL    Albumin,Serum 4.5 3.5 - 5.0 g/dL    Total Protein 8.1 6.3 - 8.2 g/dL    Total Bilirubin 0.2 0.2 - 1.3 mg/dL    Alkaline Phosphatase 101 38 - 126 U/L    ALT 31 0 - 34 U/L    AST 36 15 - 46 U/L   Lipase    Collection Time: 09/24/18  2:53 AM   Result Value Ref Range    Lipase 192 23 - 300 U/L   Troponin x1    Collection Time: 09/24/18  2:53 AM   Result Value Ref Range    Troponin I 0.036 (H) 0.000 - 0.034 ng/mL   D-Dimer, Quantitative    Collection Time: 09/24/18  2:53 AM   Result Value Ref Range    D-Dimer, Quant 0.54 (H) 0.00 - 0.50 mg/L   Troponin    Collection Time: 09/24/18  5:25 AM   Result Value Ref Range    Troponin I 0.044 (H) 0.000 - 0.034 ng/mL   TSH without Reflex    Collection Time: 09/24/18  5:25 AM   Result Value Ref Range    TSH 1.402 0.465 - 4.680 u[IU]/mL   Cortisol Total    Collection Time: 09/24/18  5:25 AM   Result Value Ref Range    Cortisol 6.8 ug/dL   Add On Lab Test    Collection Time: 09/24/18  6:24 AM   Result Value Ref Range    Add On Accepted NA   Basic Metabolic Panel    Collection Time: 09/24/18  6:54 AM   Result Value Ref Range    Sodium 134 (L) 135 - 145 mmol/L    Potassium 4.4 3.5 - 5.1 mmol/L    Chloride 103 98 - 107 mmol/L    CO2 23 22 - 30 mmol/L    Anion Gap 8 NA    Glucose 126 (H) 70 - 100 mg/dL    BUN 19 7 - 20 mg/dL    CREATININE 0.83 0.52 - 1.25 mg/dL    eGFR African American >90.0 >60 mL/min    EGFR  IF NonAfrican American >90.0 >60 mL/min    Calcium 9.1 8.4 - 10.4 mg/dL   CBC Auto Differential    Collection Time: 09/24/18  6:54 AM   Result Value Ref Range    WBC 8.5 3.6 - 10.7 10*3/uL    RBC 4.33 3.80 - 5.20 10*6/uL    Hemoglobin 12.6 11.7 - 16.0 g/dL    Hematocrit 37.3 35.0 - 47.0 %    MCV 86.0 79.0 -  98.0 fL    MCH 29.1 26.0 - 34.0 pg    MCHC 33.9 32.0 - 36.0 %    RDW 13.8 11.5 - 14.5 %    Platelets 258 140 - 440 10*3/uL    MPV 8.3 7.4 - 10.4 fL    Granulocytes % 81.4 (H) 40.0 - 80.0 %    Lymphocyte % 15.2 (L) 20.0 - 40.0 %    Monocytes 2.5 2.0 - 10.0 %    Eosinophils 0.3 (L) 1.0 - 6.0 %    Basophils 0.6 0.0 - 2.0 %    Absolute Neut # 6.9 1.8 - 7.0 10*3/uL    Absolute Lymph # 1.3 1.0 - 4.3 10*3/uL    Absolute Mono # 0.2 0.0 - 0.8 10*3/uL    Absolute Eos # 0.0 0.0 - 0.5 10*3/uL    Absolute Baso # 0.1 0.0 - 0.2 10*3/uL   Troponin    Collection Time: 09/24/18  6:54 AM   Result Value Ref Range    Troponin I 0.045 (H) 0.000 - 0.034 ng/mL     Urine Culture:  No results found for this or any previous visit.   I reviewed:   '[x]'  laboratory results   '[x]'  radiographic results  At the time of today's encounter.   Pt was advised of the results.     IMPRESSION :   -Hypertensive emergency due to adverse reaction from sumatriptan  -Elevated troponins due to uncontrolled hypertension  -Type 2 diabetes.  -Morbid obesity.  -Migraine headache  -elevated d-dimer    Plan:  - Blood pressure controlled with labetalol and hydralazine, resume home medicines.  -Workup for secondary hypertension with renal ultrasound  -Trend troponins.,Echo pending  -Check CT head if persists to have headache  -continue home insulin  -CT chest negative for PE or dissection  -PT/OT eval/increase activity  -am labs, replace lytes prn  -vitals per routine  -home meds as ordered  -DVT prophylaxis:  '[x]'  Lovenox                                 '[]'  Heparin                                  '[]'  SCDs                                   '[x]'  Encourage ambulation              '[]'  Already on Anticoagulation   '[]'  Pharmocologic prophylaxis on hold to due risk of bleeding or procedure  -see below for additional orders, further recommendations to follow  Orders Placed This Encounter   Procedures   ??? XR CHEST PORTABLE   ??? CTA Chest W WO  (PE study)   ??? VL Renal Arterial Duplex Complete   ??? Hemogram  (CBC) w/Auto Diff   ??? Comprehensive Metabolic Panel   ??? Lipase   ??? Troponin x1   ??? D-Dimer, Quantitative   ??? Troponin   ??? Basic Metabolic Panel   ??? CBC Auto Differential   ??? Troponin   ??? Urinalysis   ??? URINE DRUG SCREEN   ??? Add On Lab Test   ??? TSH without Reflex   ??? Cortisol Total   ??? Renin   ??? METANEPHRINES PLASMA  FRACTIONATED   ??? ALDOSTERONE, SERUM   ??? CBC Auto Differential   ??? Basic Metabolic Panel   ??? DIET LOW SODIUM 2 GM;   ??? Vital signs per unit routine   ??? Up as tolerated   ??? Telemetry monitoring - 48 hour duration   ??? Full Code   ??? Initiate Oxygen Therapy Protocol   ??? EKG 12 Lead   ??? ECHO Complete 2D W Doppler W Color   ??? Insert peripheral IV   ??? PATIENT STATUS (FROM ED OR OR/PROCEDURAL) Observation     Code status: Full Code    Please forward a copy of this H&P to the patient's PCP. Thank you.  Electronically signed by Samuel Jester, MD on 09/24/18 at 8:31 AM EDT

## 2018-09-24 NOTE — Progress Notes (Signed)
Report given from [facility], introduced self to patient. Patient expressed when she takes Sumatriptan she takes two pills at once. Education given on side effects of vasoconstrictors and dosing of Sumatriptan. Patient expresses understanding, will continue to monitor. No needs at this time.

## 2018-09-24 NOTE — ED Notes (Signed)
Patient tolerated EKG well.       Elouise Munroe, RN  09/24/18 [phone]

## 2018-09-24 NOTE — ED Notes (Signed)
Pt ambulated independently in hallway to restroom. Gait steady and even.         Janene Madeira, RN  09/24/18 [phone]

## 2018-09-25 LAB — CBC WITH AUTO DIFFERENTIAL
Absolute Baso #: 0.1 10*3/uL (ref 0.0–0.2)
Absolute Eos #: 0.1 10*3/uL (ref 0.0–0.5)
Absolute Lymph #: 3.8 10*3/uL (ref 1.0–4.3)
Absolute Mono #: 0.7 10*3/uL (ref 0.0–0.8)
Absolute Neut #: 6.1 10*3/uL (ref 1.8–7.0)
Basophils: 1.4 % (ref 0.0–2.0)
Eosinophils: 1.4 % (ref 1.0–6.0)
Granulocytes %: 56.3 % (ref 40.0–80.0)
Hematocrit: 36.6 % (ref 35.0–47.0)
Hemoglobin: 12.3 g/dL (ref 11.7–16.0)
Lymphocyte %: 34.7 % (ref 20.0–40.0)
MCH: 28.9 pg (ref 26.0–34.0)
MCHC: 33.7 % (ref 32.0–36.0)
MCV: 85.6 fL (ref 79.0–98.0)
MPV: 8.2 fL (ref 7.4–10.4)
Monocytes: 6.2 % (ref 2.0–10.0)
Platelets: 292 10*3/uL (ref 140–440)
RBC: 4.28 10*6/uL (ref 3.80–5.20)
RDW: 13.8 % (ref 11.5–14.5)
WBC: 10.9 10*3/uL — ABNORMAL HIGH (ref 3.6–10.7)

## 2018-09-25 LAB — ECHOCARDIOGRAM COMPLETE 2D W DOPPLER W COLOR: Left Ventricular Ejection Fraction: 48

## 2018-09-25 LAB — BASIC METABOLIC PANEL
Anion Gap: 5 NA
BUN: 23 mg/dL — ABNORMAL HIGH (ref 7–20)
CO2: 26 mmol/L (ref 22–30)
Calcium: 8.6 mg/dL (ref 8.4–10.4)
Chloride: 104 mmol/L (ref 98–107)
Creatinine: 0.89 mg/dL (ref 0.52–1.25)
EGFR IF NonAfrican American: 82.9 mL/min (ref >60)
Glucose: 61 mg/dL — ABNORMAL LOW (ref 70–100)
Potassium: 4.1 mmol/L (ref 3.5–5.1)
Sodium: 134 mmol/L — ABNORMAL LOW (ref 135–145)
eGFR African American: >90 mL/min (ref >60)

## 2018-09-25 LAB — LIPID PANEL
Chol/HDL Ratio: 3 NA
Cholesterol: 159 mg/dL (ref <200)
HDL: 49 mg/dL (ref 40–60)
LDL Cholesterol: 68 mg/dL (ref <100)
Triglycerides: 210 mg/dL — AB (ref <150)

## 2018-09-25 LAB — POCT GLUCOSE
POC Glucose: 154 mg/dL — ABNORMAL HIGH (ref 70–100)
POC Glucose: 75 mg/dL (ref 70–100)
POC Glucose: 154 mg/dL — ABNORMAL HIGH (ref 70–100)

## 2018-09-25 MED ORDER — OXYCODONE-ACETAMINOPHEN 5-325 MG PO TABS
5-325 MG | Freq: Four times a day (QID) | ORAL | Status: DC | PRN
Start: 2018-09-25 — End: 2018-09-26
  Administered 2018-09-25 – 2018-09-26 (×2): 1 via ORAL

## 2018-09-25 MED ORDER — SODIUM CHLORIDE 0.9 % IV SOLN
0.9 | INTRAVENOUS | Status: DC
Start: 2018-09-25 — End: 2018-09-26
  Administered 2018-09-25: 19:00:00 via INTRAVENOUS

## 2018-09-25 MED ORDER — ASPIRIN 81 MG PO TBEC
81 MG | Freq: Every day | ORAL | Status: DC
Start: 2018-09-25 — End: 2018-09-25

## 2018-09-25 MED ORDER — LABETALOL HCL 5 MG/ML IV SOLN
5 | INTRAVENOUS | Status: DC | PRN
Start: 2018-09-25 — End: 2018-09-26

## 2018-09-25 MED ORDER — ASPIRIN 300 MG RE SUPP
300 MG | Freq: Every day | RECTAL | Status: DC
Start: 2018-09-25 — End: 2018-09-25

## 2018-09-25 MED ORDER — NORMAL SALINE FLUSH 0.9 % IV SOLN
0.9 | INTRAVENOUS | Status: DC | PRN
Start: 2018-09-25 — End: 2018-09-25

## 2018-09-25 MED ORDER — ACETAMINOPHEN 325 MG PO TABS
325 MG | ORAL | Status: DC | PRN
Start: 2018-09-25 — End: 2018-09-25

## 2018-09-25 MED ORDER — POLYETHYLENE GLYCOL 3350 17 G PO PACK
17 g | Freq: Every day | ORAL | Status: DC | PRN
Start: 2018-09-25 — End: 2018-09-25

## 2018-09-25 MED ORDER — ATORVASTATIN CALCIUM 40 MG PO TABS
40 MG | Freq: Every evening | ORAL | Status: DC
Start: 2018-09-25 — End: 2018-09-26
  Administered 2018-09-26: 01:00:00 40 mg via ORAL

## 2018-09-25 MED ORDER — ONDANSETRON HCL 4 MG/2ML IJ SOLN
4 MG/2ML | Freq: Four times a day (QID) | INTRAMUSCULAR | Status: DC | PRN
Start: 2018-09-25 — End: 2018-09-26

## 2018-09-25 MED ORDER — ATORVASTATIN CALCIUM 40 MG PO TABS
40 MG | Freq: Every evening | ORAL | Status: DC
Start: 2018-09-25 — End: 2018-09-25

## 2018-09-25 MED ORDER — ASPIRIN 81 MG PO CHEW
81 MG | Freq: Every day | ORAL | Status: DC
Start: 2018-09-25 — End: 2018-09-26
  Administered 2018-09-25 – 2018-09-26 (×2): 81 mg via ORAL

## 2018-09-25 MED ORDER — NORMAL SALINE FLUSH 0.9 % IV SOLN
0.9 | Freq: Two times a day (BID) | INTRAVENOUS | Status: DC
Start: 2018-09-25 — End: 2018-09-25

## 2018-09-25 MED ORDER — ACETAMINOPHEN 650 MG RE SUPP
650 MG | RECTAL | Status: DC | PRN
Start: 2018-09-25 — End: 2018-09-25

## 2018-09-25 MED FILL — ASPIRIN 81 MG PO CHEW: 81 mg | ORAL | Qty: 1

## 2018-09-25 MED FILL — ACETAMINOPHEN 325 MG PO TABS: 325 MG | ORAL | Qty: 2

## 2018-09-25 MED FILL — CARVEDILOL 25 MG PO TABS: 25 mg | ORAL | Qty: 1

## 2018-09-25 MED FILL — ENOXAPARIN SODIUM 40 MG/0.4ML SC SOLN: 40 MG/0.4ML | SUBCUTANEOUS | Qty: 0.4

## 2018-09-25 MED FILL — HYDRALAZINE HCL 20 MG/ML IJ SOLN: 20 MG/ML | INTRAMUSCULAR | Qty: 1

## 2018-09-25 MED FILL — AMLODIPINE BESYLATE 5 MG PO TABS: 5 MG | ORAL | Qty: 2

## 2018-09-25 MED FILL — OXYCODONE-ACETAMINOPHEN 5-325 MG PO TABS: 5-325 mg | ORAL | Qty: 1

## 2018-09-25 MED FILL — LISINOPRIL 20 MG PO TABS: 20 mg | ORAL | Qty: 1

## 2018-09-25 MED FILL — PANTOPRAZOLE SODIUM 40 MG PO TBEC: 40 MG | ORAL | Qty: 1

## 2018-09-25 MED FILL — ASPIRIN 300 MG RE SUPP: 300 mg | RECTAL | Qty: 1

## 2018-09-25 MED FILL — HYDRALAZINE HCL 20 MG/ML IJ SOLN: 20 mg/mL | INTRAMUSCULAR | Qty: 1

## 2018-09-25 NOTE — Consults (Signed)
Neurology Consult Note - Neurology Inpatient Service    Patient Name: Sierra [name]  Patient DOB: [birth year]  MRN: [id number]     Acct: [id number]  Date of Admission: 09/24/2018  Room/Bed: 1510/1510B  PCP: Azalee Course, DO    09/25/2018    Reason for Consult:  Stroke on CT scan     History of Presenting Illness: The patient is 38 y.o. female  -- who is being seen as a new consult for stroke on CT scan.    The patient presented to the ER with complaints of shortness of breath and chest pressure after taking 2 sumatriptan tablets. In the ER she was found to have a BP of 252/154. She has a history of hypertension and takes 3 agents for blood pressure control at home, prescribed by her PCP     The patient also has a history of migraine headaches. She has been having a migraine headache intermittently for the past week. It is recurrent and ongoing.     Her migraine headache started off as just a headache, then gradually progressed into a migraine. She reports that she had associated photophobia and phonophobia at that time. She describes it as a pounding/pressure feeling in her right frontal head.      Her headache was severe in the fact that she couldn't perform any ADLs due to the pain, or tolerate any light or loud sound.     In the ER, she was treated with labetalol and hydralazine for her hypertension. Her BP improved. With the improvement of her BP, her headache also improved.     However, the headache has since become worse. For that reason, she had a CT head.     No other pertinent or modifying factors reported except for what is already noted above.    No associated weakness in extremities, double vision, or head trauma. No recent fever or chills.         Review of systems:    General: No reported chills, no fever. Reported obesity.    HEENT: Reports headache. No head injury. No reported eye pain or eye congestion. No reported ear pain or ear congestion. No reported nasal congestion or nosebleed. No  reported throat congestion or infection.    Neck: No reported neck pain. No reported neck stiffness.    Respiratory: No reported wheezing. Reports shortness of breath but states this has improved since coming to the hospital.    Cardiac: No reported chest pain and no reported palpitations.    Gastrointestinal: No reported nausea, vomiting, abdominal pain and, no reported diarrhea.    Musculoskeletal: No reported arthralgia and, no reported low back pain.    Endocrine: No reported thyroid disease. Reports type 2 diabetes mellitus.    Psychiatry: No reported anxiety and no reported depression.    Neurological: No reported alteration in mental state. No reported weakness in extremities. No reported speech deficit. No reported seizures. No reported tongue bite or loss of bowel/bladder control. No reported stroke. No reported visual changes. No reported speech changes. No reported vertigo. No reported hearing or gait or ambulatory decline.     Past medical History, surgical history, family history and social history were reviewed with the patient/care provider and were reviewed in the chart. Only the available information is documented below. Thank you.    Past Medical History:    History reviewed. No pertinent past medical history.    Past Surgical History:    History reviewed. No pertinent surgical history.  Family History:       History reviewed. No pertinent family history.     Social History:      TOBACCO:   reports that she has quit smoking. She has never used smokeless tobacco.  ETOH:   has no history on file for alcohol.  RECREATIONAL DRUG USE:   Social History     Substance and Sexual Activity   Drug Use Not on file       The patient's medications and allergies were reviewed and the available information is documented below. Thank you.    Allergies: Patient has no known allergies.    Home Medications:   Prior to Admission medications    Medication Sig Start Date End Date Taking? Authorizing Provider    ranitidine (ZANTAC) 150 MG tablet Take 150 mg by mouth 08/10/17  Yes Historical Provider, MD   oxaprozin (DAYPRO) 600 MG tablet Take 600 mg by mouth 08/31/17  Yes Historical Provider, MD   omeprazole (PRILOSEC) 40 MG delayed release capsule TAKE 1 CAPSULE BY MOUTH ONCE DAILY 30 MINUTES BEFORE MORNING MEAL 07/23/17  Yes Historical Provider, MD   lisinopril (PRINIVIL;ZESTRIL) 20 MG tablet Take 20 mg by mouth 12/24/17  Yes Historical Provider, MD   carvedilol (COREG) 25 MG tablet Take 25 mg by mouth 10/27/17  Yes Historical Provider, MD   amLODIPine (NORVASC) 10 MG tablet Take 10 mg by mouth 10/27/17  Yes Historical Provider, MD       Current Hospital Medications:    Current Facility-Administered Medications:   ???  aspirin chewable tablet 81 mg, 81 mg, Oral, Daily, Deepthi Singam, MD, 81 mg at 09/25/18 1200  ???  atorvastatin (LIPITOR) tablet 40 mg, 40 mg, Oral, Nightly, Deepthi Singam, MD  ???  0.9 % sodium chloride infusion, , Intravenous, Continuous, Zamariah Seaborn, APRN - CNP  ???  ondansetron (ZOFRAN) injection 4 mg, 4 mg, Intravenous, Q6H PRN, Elenor Legato, APRN - CNP  ???  labetalol (NORMODYNE;TRANDATE) injection 10 mg, 10 mg, Intravenous, Q10 Min PRN, Elenor Legato, APRN - CNP  ???  amLODIPine (NORVASC) tablet 10 mg, 10 mg, Oral, Daily, Inderpartap S Phangureh, MD, 10 mg at 09/25/18 1200  ???  carvedilol (COREG) tablet 25 mg, 25 mg, Oral, BID WC, Inderpartap S Phangureh, MD, 25 mg at 09/25/18 1200  ???  pantoprazole (PROTONIX) tablet 40 mg, 40 mg, Oral, QAM AC, Inderpartap S Phangureh, MD, 40 mg at 09/25/18 0500  ???  sodium chloride flush 0.9 % injection 10 mL, 10 mL, Intravenous, 2 times per day, Sheran Lawless, MD, 10 mL at 09/24/18 2117  ???  sodium chloride flush 0.9 % injection 10 mL, 10 mL, Intravenous, PRN, Sheran Lawless, MD  ???  acetaminophen (TYLENOL) tablet 650 mg, 650 mg, Oral, Q6H PRN, 650 mg at 09/25/18 1200 **OR** acetaminophen (TYLENOL) suppository 650 mg, 650 mg, Rectal, Q6H PRN, Sheran Lawless, MD  ???  polyethylene glycol (GLYCOLAX) packet 17 g, 17 g, Oral, Daily PRN, Sheran Lawless, MD  ???  enoxaparin (LOVENOX) injection 40 mg, 40 mg, Subcutaneous, Daily, Inderpartap S Phangureh, MD, 40 mg at 09/25/18 1200  ???  perflutren lipid microspheres (DEFINITY) injection 1.65 mg, 1.5 mL, Intravenous, ONCE PRN, Inderpartap S Phangureh, MD  ???  sodium chloride flush 0.9 % injection 10 mL, 10 mL, Intravenous, PRN, Sheran Lawless, MD  ???  lisinopril (PRINIVIL;ZESTRIL) tablet 20 mg, 20 mg, Oral, Daily, Deepthi Singam, MD, 20 mg at 09/25/18 1200  ???  hydrALAZINE (APRESOLINE) injection 10 mg,  10 mg, Intravenous, Q6H PRN, Deepthi Singam, MD, 10 mg at 09/25/18 1159  ???  insulin 70-30 (HUMULIN;NOVOLIN) injection vial 100 Units, 100 Units, Subcutaneous, BID, Deepthi Singam, MD, 100 Units at 09/24/18 2112  ???  insulin lispro (HUMALOG) injection vial 25 Units, 25 Units, Subcutaneous, TID WC, Deepthi Singam, MD, 25 Units at 09/24/18 1325  ???  glucose (GLUTOSE) 40 % oral gel 15 g, 15 g, Oral, PRN, Deepthi Singam, MD  ???  dextrose 50 % IV solution, 12.5 g, Intravenous, PRN, Deepthi Singam, MD  ???  glucagon (rDNA) injection 1 mg, 1 mg, Intramuscular, PRN, Deepthi Singam, MD  ???  dextrose 5 % solution, 100 mL/hr, Intravenous, PRN, Deepthi Singam, MD     Continuous Infusions:  ??? sodium chloride     ??? dextrose         Physical Examination:    General Exam:    Constitutional: The patient is not obese.    HEENT: Head is normocephalic and atraumatic. No eye congestion or eye infection. No ear infection or ear congestion. No nasal congestion or nasal infection. No throat infection or congestion.    Neck: Supple, short, no bruits-bilaterally.    Chest: CTA - Bilaterally, no wheezing.    Abdomen: Soft, non-tender, bowel sounds are positive.    Heart: S1, S2, RRR.    Extremities: No cyanosis, no clubbing, and no edema.    Neurological Exam:    Dexterity: The patient is RIGHT handed.    Mental Status: Alert, Awake, Oriented  x 4, Normal Speech and intact comprehension - 3/3 repetition and recall.    Cranial Nerves: CN-II, CN-III, CN-IV, CN-V, CN-VI, CN-VII, CN-VIII, CN-IX, CN-X, CN-XI and, CN-II are within normal limits bilaterally.    Motor:   Bulk = Symmetric and within normal limits bilaterally.   Tone = Symmetric and within normal limits bilaterally.    Strength = 5+/5 in all 4 extremities.   DTRs = Trace throughout.   Plantars = Down-going bilaterally.   Abnormal movements = None.   Opposition = Normal in both upper extremities.   Pronator Drift = No pronator drift on the RIGHT and, no pronator drift on the LEFT side.    Sensory:   Light touch, pinprick and vibration exams are within normal limits.    Coordination:   Finger to nose is normal on the right side and, on the left side.   Heel to shin is normal on the right side and, on the left side.   Rapid alternation movements are normal on the right side and, on the left side.    Gait:  Deferred secondary to fall risk.    Romberg:  Deferred secondary to fall risk    NIHSS score:  N/A.      Results:    Labs:    Last 24hrs  Recent Results (from the past 24 hour(s))   Troponin    Collection Time: 09/24/18  5:31 PM   Result Value Ref Range    Troponin I 0.027 0.000 - 0.034 ng/mL   POCT Glucose    Collection Time: 09/24/18  6:20 PM   Result Value Ref Range    POC Glucose 100 70 - 100 mg/dL   POCT Glucose    Collection Time: 09/24/18  9:10 PM   Result Value Ref Range    POC Glucose 154 (H) 70 - 100 mg/dL   CBC Auto Differential    Collection Time: 09/25/18  5:06 AM   Result Value Ref Range  WBC 10.9 (H) 3.6 - 10.7 10*3/uL    RBC 4.28 3.80 - 5.20 10*6/uL    Hemoglobin 12.3 11.7 - 16.0 g/dL    Hematocrit 36.6 35.0 - 47.0 %    MCV 85.6 79.0 - 98.0 fL    MCH 28.9 26.0 - 34.0 pg    MCHC 33.7 32.0 - 36.0 %    RDW 13.8 11.5 - 14.5 %    Platelets 292 140 - 440 10*3/uL    MPV 8.2 7.4 - 10.4 fL    Granulocytes % 56.3 40.0 - 80.0 %    Lymphocyte % 34.7 20.0 - 40.0 %    Monocytes 6.2 2.0 - 10.0 %     Eosinophils 1.4 1.0 - 6.0 %    Basophils 1.4 0.0 - 2.0 %    Absolute Neut # 6.1 1.8 - 7.0 10*3/uL    Absolute Lymph # 3.8 1.0 - 4.3 10*3/uL    Absolute Mono # 0.7 0.0 - 0.8 10*3/uL    Absolute Eos # 0.1 0.0 - 0.5 10*3/uL    Absolute Baso # 0.1 0.0 - 0.2 59*5/GL   Basic Metabolic Panel    Collection Time: 09/25/18  5:06 AM   Result Value Ref Range    Sodium 134 (L) 135 - 145 mmol/L    Potassium 4.1 3.5 - 5.1 mmol/L    Chloride 104 98 - 107 mmol/L    CO2 26 22 - 30 mmol/L    Anion Gap 5 NA    Glucose 61 (L) 70 - 100 mg/dL    BUN 23 (H) 7 - 20 mg/dL    CREATININE 0.89 0.52 - 1.25 mg/dL    eGFR African American >90.0 >60 mL/min    EGFR IF NonAfrican American 82.9 >60 mL/min    Calcium 8.6 8.4 - 10.4 mg/dL   Lipid Panel    Collection Time: 09/25/18  5:06 AM   Result Value Ref Range    Cholesterol 159 <200 mg/dL    Triglycerides 210 (A) <150 mg/dL    HDL 49 40 - 60 mg/dL    LDL Cholesterol 68 <100 mg/dL    Chol/HDL Ratio 3 NA   POCT Glucose    Collection Time: 09/25/18  8:07 AM   Result Value Ref Range    POC Glucose 75 70 - 100 mg/dL       Since admission:  Recent Labs     09/24/18  0654 09/24/18  1332 09/24/18  1731   TROPONINI 0.045* 0.026 0.027     Recent Labs     09/24/18  0253   ALKPHOS 101   ALT 31   AST 36   BILITOT 0.2   LABALBU 4.5   LIPASE 192     Stroke Specific Labs:  Lipids:    Recent Labs     09/24/18  0253 09/25/18  0506   CHOL  --  159   LDLCHOLESTEROL  --  68   TRIG  --  210*   HDL  --  49   LIPASE 192  --      TSH:     Lab Results   Component Value Date    TSH 1.402 09/24/2018        Radiology Personal review:  CT Head 09/25/18  No evidence of intracranial hemorrhage. Subtle, vague area of decreased attenuation along the posterior aspect of the left basal ganglia could represent an area of acute ischemic change. MRI would be helpful for additional evaluation.  ECHO 09/25/18  Left ventricle mildly dilated. Estimated EF 48%. Right ventricle has normal systolic function. Left atrium is mildly dilated.  Pulmonary arteries systolic pressure is mildly increased. No significant valve disease.     Neurophysiology Results:    EEG: N/A    EMG/NCS: N/A.    ASSESSMENT / PLAN / RECOMMENDATIONS :    Assessment:    Stroke on CT scan  Migraine headache  Adverse reaction to Sumatriptan   Malignant hypertension  Diabetes - Defer to primary team for management      Recommendations:    -Admit to neuro / telemetry (3W, 4W, 4E )   -MRI/MRA - ordered and pending  -Continue ASA and Lipitor  -Continue Tylenol PRN for headache  -Monitor telemetry   -Check A1c  -Monitor and control BP  -Labetalol PRN  -We don't recommend any AEDs or antidepressants for migraine prophylaxis   -We recommend Emg[facility] as an outpatient    -Follow up with neurology outpatient       Disposition/Discharge issues:    Pending the results of the neuroimaging.     Thank you.    Patient seen and discussed with Dr. Macy Mis      Electronically signed by Elenor Legato, APRN - CNP on 09/25/2018 at 2:31 PM

## 2018-09-25 NOTE — Procedures (Signed)
Post Procedure Patient Status  Diagnostic Department  []  Cardiac Cath Lab  []  Cardiology - Diagnostic  []  CT Scan  []  Dialysis  []  Diagnostic Radiology (X-Ray)  []  Endoscopy  []  EP Lab (cardiac)  []  MRI  []  Nuclear Medicine  []  Neurology  [x]  Noninvasive Vascular  []  Pulmonary Function  []  Radiation Therapy  []  Special Procedures (Angio)  []  TEE  []  Ultrasound  []  Breast Center         Dept Phone Extension  []  Dept Phone Extension Number         Diagnostic Post Procedure Status  [x]  Test/Procedure performed - tolerated well  []  Test/Procedure performed - see comments:  []  Test/Procedure partially performed - see comments:  []  Test/Procedure not performed - see comments:         Post Procedure Transportation  [x]  Patient transported to their inpatient room  []  Patient transported to the Emergency Dept  []  Patient transported to another diagnostic area  []  Patient transported to SDS  []  Patient transported to PACU  []  Patient transported to Cath Lab Observation         Patient medicated during procedure?  Patient Medicated before, during, or post procedure?  []  YES - see procedure note, MAR and/or obtain report         Patient sedated during procedure?  Patient received conscious sedation for procedure?  []  YES - see procedure note, MAR, and/or obtain report         Patient received contrast for procedure?  Patient received contrast for procedure?  []  YES - see procedure note, MAR and/or obtain report         Specimen(s) collected and sent to Lab per Diagnostic area?  Specimen(s) collected & sent to Lab per Diagnostic area?  []  YES  []  NO       Electronically signed by Clent Ridges on 09/25/2018 at 10:06 AM

## 2018-09-25 NOTE — Progress Notes (Signed)
Hospitalist Progress Note  09/25/2018 1:25 PM    0700-1900: Please page me for patient care issues.   1900-0700: Please page [facility] night Hospitalist for any issues.  Subjective:   Admit Date: 09/24/2018  PCP: Azalee Course, DO  Room#: 1510/1510B    Interval History:      Patient seen and examined.I was wearing N95 mask throughout the encounter.   still complains of headache.  Denies any weakness, numbness.  DIET LOW SODIUM 2 GM;   Patient Vitals for the past 96 hrs (Last 3 readings):   Weight   09/24/18 0620 (!) 413 lb 12.8 oz (187.7 kg)   09/24/18 0130 (!) 375 lb (170.1 kg)     No intake or output data in the 24 hours ending 09/25/18 1325  LABS:   CBC:   Recent Labs     09/24/18  0253 09/24/18  0654 09/25/18  0506   WBC 9.2 8.5 10.9*   RBC 4.61 4.33 4.28   HGB 13.3 12.6 12.3   HCT 39.1 37.3 36.6   MCV 84.8 86.0 85.6   RDW 13.6 13.8 13.8   PLT 277 258 292     BMP:  Recent Labs     09/24/18  0253 09/24/18  0654 09/25/18  0506   NA 134* 134* 134*   K 4.1 4.4 4.1   CL 103 103 104   CO2 26 23 26    BUN 20 19 23*   CREATININE 0.83 0.83 0.89   GLUCOSE 103* 126* 61*   CALCIUM 9.3 9.1 8.6   ANIONGAP 5 8 5      LIVER PROFILE:  Recent Labs     09/24/18  0253   AST 36   ALT 31   BILITOT 0.2   ALKPHOS 101   LABALBU 4.5   PROT 8.1      PT/INR: No results for input(s): PROTIME, INR in the last 72 hours.  CARDIAC ENZYMES:   Recent Labs     09/24/18  0654 09/24/18  1332 09/24/18  1731   TROPONINI 0.045* 0.026 0.027     Procalcitonin: No results found for: PROCAL  Objective:   Vitals: BP (!) 173/123    Pulse 88    Temp 96.9 ??F (36.1 ??C) (Temporal)    Resp 14    Ht 5\' 6"  (1.676 m)    Wt (!) 413 lb 12.8 oz (187.7 kg)    SpO2 98%    BMI 66.79 kg/m??   Pulse Ox: SpO2  Avg: 97.4 %  Min: 96 %  Max: 98 %  Supplemental O2:    General appearance:  No apparent distress,awake alert  HEENT:  Eyes: No scleral icterus,No pallor  Oral: Tongue is semi-moist  Cardiovascular:  S1S2 heard, RRR  Respiratory: Clear to auscultation bilaterally  Abdomen: Soft,  non-tender, non-distended with normal bowel sounds.   Musculoskeletal:  No obvious deformities seen  Skin: No visible rashes or lesions.  Neurology- no focal neurological deficits  Extremity- no peripheral edema both lower extremities    Assessment      Active Problems  - Hypertensive emergency.  -Acute stroke on CT head.  -Elevated troponins due to uncontrolled hypertension.  -Elevated d-dimer.    Chronic problems  -type 2 diabetes.  -Morbid obesity.  -Migraine headache.    Plan  -blood pressure moderately controlled, continue current medications, p.r.n. hydralazine.  -CT head revealed acute stroke in left basal ganglia, check magnetic resonance imaging brain, consult neurology. start aspirin, statin.  -Ultrasound unremarkable for  renal artery stenosis.  -Echo-pending  On DVT/ GI prophylaxis  Labs ordered for AM   Patient was informed about all work up and treatment plan  Discussed with nursing staff    Advance Directive: Full Code     Discharge planning:   Pending tests, consultant recommendations  Noal Abshier,MD  Division of Hospitalist Medicine  Inpatient Medical Services

## 2018-09-25 NOTE — Plan of Care (Signed)
Problem: Falls - Risk of:  Goal: Will remain free from falls  Description: Will remain free from falls  09/25/2018 2120 by Rosalie Doctor, RN  Outcome: Ongoing  09/25/2018 1952 by Renita Papa, RN  Outcome: Met This Shift  09/25/2018 1649 by Starlyn Skeans, RN  Outcome: Met This Shift  Goal: Absence of physical injury  Description: Absence of physical injury  09/25/2018 2120 by Rosalie Doctor, RN  Outcome: Ongoing  09/25/2018 1952 by Renita Papa, RN  Outcome: Met This Shift  09/25/2018 1649 by Starlyn Skeans, RN  Outcome: Met This Shift     Problem: Pain:  Goal: Pain level will decrease  Description: Pain level will decrease  09/25/2018 2120 by Rosalie Doctor, RN  Outcome: Ongoing  09/25/2018 1952 by Renita Papa, RN  Outcome: Ongoing  09/25/2018 0811 by Starlyn Skeans, RN  Outcome: Ongoing  Goal: Control of acute pain  Description: Control of acute pain  09/25/2018 2120 by Rosalie Doctor, RN  Outcome: Ongoing  09/25/2018 1952 by Renita Papa, RN  Outcome: Ongoing  Goal: Control of chronic pain  Description: Control of chronic pain  Outcome: Ongoing     Problem: HEMODYNAMIC STATUS  Goal: Patient has stable vital signs and fluid balance  Outcome: Ongoing     Problem: ACTIVITY INTOLERANCE/IMPAIRED MOBILITY  Goal: Mobility/activity is maintained at optimum level for patient  Outcome: Ongoing     Problem: COMMUNICATION IMPAIRMENT  Goal: Ability to express needs and understand communication  Outcome: Ongoing

## 2018-09-25 NOTE — Progress Notes (Signed)
Occupational Therapy    OT hold note    Received orders for OT Eval and Treat per Barthel Index. Barthel Index currently not completed. Will hold OT eval until Barthel Index is documented.  Emilie Rutter OTR/L

## 2018-09-25 NOTE — Progress Notes (Signed)
Pt transferred to 3W without complications, patient confirmed all belongings intact.

## 2018-09-25 NOTE — Other (Signed)
NURSING BEDSIDE SWALLOW SCREENING    NIH Inclusion Criteria  Yes No        [x]       []   NIH score for 1a (LOC) is 0       [x]       []   NIH score for 1c (LOC commands) is 0       [x]       []   NIH score for 4 (facial palsy) is 0 or 1       [x]       []   NIH score for 9 (best language) is 0, 1 or 2       [x]       []   NIH score for 10 (dysarthria) is 0 or 1       [x]       []   Patient is able to sit up in bed alone       [x]    YES TO ALL OF THE ABOVE, proceed to protocol        []   NO, patient does not meet all of the above criteria, KEEP STRICT NPO UNTIL SEEN BY SPEECH THERAPY      Protocol: If patient fails ANY part of ANY step, STOP FURTHER TESTING  Give 1 tsp Lemon Ice - ALL ROWS MUST HAVE BOX CHECKED  Yes/Pass No/Fail        [x]       []   Prompt swallow present       [x]       []   NO cough = Yes/Pass       [x]       []   NO change in vocal quality = Yes/Ppass     Give 1 tsp Applesauce - ALL ROWS MUST HAVE BOX CHECKED  Yes/Pass No/Fail        [x]       []   Prompt swallow present       [x]       []   NO cough = Yes/Pass       [x]       []   NO change in vocal quality = Yes/Pass     Give 1 tsp H20 via spoon - ALL ROWS MUST HAVE BOX CHECKED  Yes/Pass No/Fail        [x]       []   Prompt swallow present       [x]       []   NO cough = Yes/Pass       [x]       []   NO change in vocal quality = Yes/Pass     ALL STEPS ABOVE MUST BE CHECKED YES/PASS FOR DIET  NO STEPS ABOVE MAY BE LEFT UNCHECKED    IF CHECKED NO/FAIL MUST REMAIN NPO      Bedside Swallow Result      [x]   Passed the swallow screening AND has an NIH of ???0??? for dysarthria and ???0??? for best language. OK to begin Cardiac Diet with thin liquids or other prescribed diet.       []   Passed the swallow screening AND has points on NIH for dysarthria and/or best language. OK to begin Dysphagia Pureed Diet with Mildly Thick (Nectar) liquids. Proceed to formal Speech Pathologist Swallow Evaluation.       []   FAILED the swallow screening.   Remain STRICT NPO  (do not give p.o. meds), proceed to formal Speech Pathologist Swallow Evaluation

## 2018-09-25 NOTE — Progress Notes (Signed)
Report given to 3 West bed assignment still not clean; 3 W to call back when room cleaned. Patient resting comfortably notified by this RN of plan to transfer, patient agreeable with no other needs at this time.

## 2018-09-25 NOTE — Progress Notes (Signed)
Primary notified of head CT results; will continue to monitor.

## 2018-09-25 NOTE — Other (Unsigned)
Patient Acct Nbr: [id number]   Primary AUTH/CERT:   Primary Insurance Company Name: [employer] Plan name: [facility] [employer]  Primary Insurance Group Number: [facility]  Primary Insurance Plan Type: Health  Primary Insurance Policy Number: [id number]

## 2018-09-26 LAB — CBC WITH AUTO DIFFERENTIAL
Absolute Baso #: 0.1 10*3/uL (ref 0.0–0.2)
Absolute Eos #: 0.1 10*3/uL (ref 0.0–0.5)
Absolute Lymph #: 3 10*3/uL (ref 1.0–4.3)
Absolute Mono #: 0.4 10*3/uL (ref 0.0–0.8)
Absolute Neut #: 3.8 10*3/uL (ref 1.8–7.0)
Basophils: 1 % (ref 0.0–2.0)
Eosinophils: 1.2 % (ref 1.0–6.0)
Granulocytes %: 51.9 % (ref 40.0–80.0)
Hematocrit: 34.7 % — ABNORMAL LOW (ref 35.0–47.0)
Hemoglobin: 11.6 g/dL — ABNORMAL LOW (ref 11.7–16.0)
Lymphocyte %: 40.2 % — ABNORMAL HIGH (ref 20.0–40.0)
MCH: 28.9 pg (ref 26.0–34.0)
MCHC: 33.5 % (ref 32.0–36.0)
MCV: 86.1 fL (ref 79.0–98.0)
MPV: 8.7 fL (ref 7.4–10.4)
Monocytes: 5.7 % (ref 2.0–10.0)
Platelets: 244 10*3/uL (ref 140–440)
RBC: 4.03 10*6/uL (ref 3.80–5.20)
RDW: 13.9 % (ref 11.5–14.5)
WBC: 7.3 10*3/uL (ref 3.6–10.7)

## 2018-09-26 LAB — COMPREHENSIVE METABOLIC PANEL W/ REFLEX TO MG FOR LOW K
ALT: 37 U/L — ABNORMAL HIGH (ref 0–34)
AST: 37 U/L (ref 15–46)
Albumin,Serum: 3.6 g/dL (ref 3.5–5.0)
Alkaline Phosphatase: 70 U/L (ref 38–126)
Anion Gap: 6 NA
BUN: 21 mg/dL — ABNORMAL HIGH (ref 7–20)
CO2: 25 mmol/L (ref 22–30)
Calcium: 8.5 mg/dL (ref 8.4–10.4)
Chloride: 104 mmol/L (ref 98–107)
Creatinine: 0.93 mg/dL (ref 0.52–1.25)
EGFR IF NonAfrican American: 78.6 mL/min (ref >60)
Glucose: 62 mg/dL — ABNORMAL LOW (ref 70–100)
Potassium: 3.8 mmol/L (ref 3.5–5.1)
Sodium: 135 mmol/L (ref 135–145)
Total Bilirubin: 0.3 mg/dL (ref 0.2–1.3)
Total Protein: 6.8 g/dL (ref 6.3–8.2)
eGFR African American: >90 mL/min (ref >60)

## 2018-09-26 LAB — POCT GLUCOSE
POC Glucose: 168 mg/dL — ABNORMAL HIGH (ref 70–100)
POC Glucose: 79 mg/dL (ref 70–100)
POC Glucose: 78 mg/dL (ref 70–100)
POC Glucose: 99 mg/dL (ref 70–100)

## 2018-09-26 LAB — ALDOSTERONE, SERUM: Aldosterone, Serum: 5.2 ng/dL

## 2018-09-26 LAB — HEMOGLOBIN A1C
Hemoglobin A1C: 6.2 % — ABNORMAL HIGH (ref 4.0–6.0)
eAG: 131 mg/dL

## 2018-09-26 LAB — TROPONIN: Troponin I: 0.02 ng/mL (ref 0.000–0.034)

## 2018-09-26 MED ORDER — INSULIN NPH ISOPHANE & REGULAR (70-30) 100 UNIT/ML SC SUSP
Freq: Two times a day (BID) | SUBCUTANEOUS | Status: DC
Start: 2018-09-26 — End: 2018-09-26
  Administered 2018-09-26: 15:00:00 90 [IU] via SUBCUTANEOUS

## 2018-09-26 MED ORDER — ATORVASTATIN CALCIUM 20 MG PO TABS
20 MG | ORAL_TABLET | Freq: Every evening | ORAL | 3 refills | Status: AC
Start: 2018-09-26 — End: ?

## 2018-09-26 MED ORDER — OXYCODONE-ACETAMINOPHEN 5-325 MG PO TABS
5-325 MG | ORAL_TABLET | Freq: Three times a day (TID) | ORAL | 0 refills | Status: AC | PRN
Start: 2018-09-26 — End: 2018-09-29

## 2018-09-26 MED ORDER — GADOBUTROL 1 MMOL/ML IV SOLN
1 MMOL/ML | Freq: Once | INTRAVENOUS | Status: AC | PRN
Start: 2018-09-26 — End: 2018-09-26
  Administered 2018-09-26: 14:00:00 15 mL via INTRAVENOUS

## 2018-09-26 MED ORDER — INSULIN NPH ISOPHANE & REGULAR (70-30) 100 UNIT/ML SC SUSP
Freq: Two times a day (BID) | SUBCUTANEOUS | 3 refills | Status: DC
Start: 2018-09-26 — End: 2020-11-07

## 2018-09-26 MED ORDER — ATORVASTATIN CALCIUM 20 MG PO TABS
20 MG | Freq: Every evening | ORAL | Status: DC
Start: 2018-09-26 — End: 2018-09-26

## 2018-09-26 MED ORDER — ATORVASTATIN CALCIUM 40 MG PO TABS
40 MG | ORAL_TABLET | Freq: Every evening | ORAL | 3 refills | Status: DC
Start: 2018-09-26 — End: 2018-09-26

## 2018-09-26 MED ORDER — INSULIN LISPRO 100 UNIT/ML SC SOLN
100 UNIT/ML | Freq: Three times a day (TID) | SUBCUTANEOUS | 3 refills | Status: AC
Start: 2018-09-26 — End: ?

## 2018-09-26 MED ORDER — ASPIRIN 81 MG PO CHEW
81 MG | ORAL_TABLET | Freq: Every day | ORAL | 3 refills | Status: DC
Start: 2018-09-26 — End: 2020-11-07

## 2018-09-26 MED FILL — ASPIRIN 81 MG PO CHEW: 81 mg | ORAL | Qty: 1

## 2018-09-26 MED FILL — ENOXAPARIN SODIUM 40 MG/0.4ML SC SOLN: 40 MG/0.4ML | SUBCUTANEOUS | Qty: 0.4

## 2018-09-26 MED FILL — PANTOPRAZOLE SODIUM 40 MG PO TBEC: 40 mg | ORAL | Qty: 1

## 2018-09-26 MED FILL — OXYCODONE-ACETAMINOPHEN 5-325 MG PO TABS: 5-325 mg | ORAL | Qty: 1

## 2018-09-26 MED FILL — LISINOPRIL 20 MG PO TABS: 20 MG | ORAL | Qty: 1

## 2018-09-26 MED FILL — CARVEDILOL 25 MG PO TABS: 25 mg | ORAL | Qty: 1

## 2018-09-26 MED FILL — ATORVASTATIN CALCIUM 40 MG PO TABS: 40 mg | ORAL | Qty: 1

## 2018-09-26 MED FILL — ACETAMINOPHEN 325 MG PO TABS: 325 MG | ORAL | Qty: 2

## 2018-09-26 MED FILL — AMLODIPINE BESYLATE 5 MG PO TABS: 5 mg | ORAL | Qty: 2

## 2018-09-26 NOTE — Progress Notes (Signed)
NEUROLOGY FOLLOW UP NOTE - Neurology Inpatient Service    Patient Name: Sierra [name]  Patient DOB: 14-Jul-1980  MRN: [id number]     Acct: [id number]  Date of Admission: 09/24/2018  Room/Bed: 1325/1325B  PCP: Azalee Course, DO    09/26/2018    Subjective: The patient is 38 y.o. female -- who is being seen for a follow visit re: migraine headache, and abnormal CT scan.    No acute overnight events. Patient states that she would like to be discharged. She still reports having a headache but states that she thinks it is from the MRI.            Review of systems:    General: No reported chills, no fever. Reported obesity.    HEENT: Reports headache. No head injury. No reported eye pain or eye congestion. No reported ear pain or ear congestion. No reported nasal congestion or nosebleed. No reported throat congestion or infection.    Neck: No reported neck pain. No reported neck stiffness.    Respiratory: No reported wheezing. Reports shortness of breath but states this has improved since coming to the hospital.    Cardiac: No reported chest pain and no reported palpitations.    Gastrointestinal: No reported nausea, vomiting, abdominal pain and, no reported diarrhea.    Musculoskeletal: No reported arthralgia and, no reported low back pain.    Endocrine: No reported thyroid disease. Reports type 2 diabetes mellitus.    Psychiatry: No reported anxiety and no reported depression.    Neurological: No reported alteration in mental state. No reported weakness in extremities. No reported speech deficit. No reported seizures. No reported tongue bite or loss of bowel/bladder control. No reported stroke. No reported visual changes. No reported speech changes. No reported vertigo. No reported hearing or gait or ambulatory decline.     Past medical History, surgical history, family history and social history were reviewed with the patient/care provider and were reviewed in the chart. Only the available information is documented  below. Thank you.    Past Medical History:    History reviewed. No pertinent past medical history.    Past Surgical History:    History reviewed. No pertinent surgical history.    Family History:       History reviewed. No pertinent family history.     Social History:      TOBACCO:   reports that she has quit smoking. She has never used smokeless tobacco.  ETOH:   has no history on file for alcohol.  RECREATIONAL DRUG USE:   Social History     Substance and Sexual Activity   Drug Use Not on file       The patient's medications and allergies were reviewed and the available information is documented below. Thank you.    Allergies: Sumatriptan    Home Medications:   Prior to Admission medications    Medication Sig Start Date End Date Taking? Authorizing Provider   oxyCODONE-acetaminophen (PERCOCET) 5-325 MG per tablet Take 1 tablet by mouth every 8 hours as needed for Pain for up to 3 days. 09/26/18 09/29/18 Yes Deepthi Singam, MD   aspirin 81 MG chewable tablet Take 1 tablet by mouth daily 09/27/18  Yes Deepthi Singam, MD   insulin 70-30 (HUMULIN;NOVOLIN) (70-30) 100 UNIT per ML injection vial Inject 90 Units into the skin 2 times daily 09/26/18  Yes Deepthi Singam, MD   insulin lispro (HUMALOG) 100 UNIT/ML injection vial Inject 25 Units into the skin 3 times  daily (with meals) 09/26/18  Yes Deepthi Singam, MD   atorvastatin (LIPITOR) 40 MG tablet Take 1 tablet by mouth nightly 09/26/18  Yes Deepthi Singam, MD   oxaprozin (DAYPRO) 600 MG tablet Take 600 mg by mouth 08/31/17  Yes Historical Provider, MD   omeprazole (PRILOSEC) 40 MG delayed release capsule TAKE 1 CAPSULE BY MOUTH ONCE DAILY 30 MINUTES BEFORE MORNING MEAL 07/23/17  Yes Historical Provider, MD   lisinopril (PRINIVIL;ZESTRIL) 20 MG tablet Take 20 mg by mouth 12/24/17  Yes Historical Provider, MD   carvedilol (COREG) 25 MG tablet Take 25 mg by mouth 10/27/17  Yes Historical Provider, MD   amLODIPine (NORVASC) 10 MG tablet Take 10 mg by mouth 10/27/17  Yes Historical  Provider, MD       Current Hospital Medications:    Current Facility-Administered Medications:   ???  insulin 70-30 (HUMULIN;NOVOLIN) injection vial 90 Units, 90 Units, Subcutaneous, BID, Deepthi Singam, MD, 90 Units at 09/26/18 1041  ???  aspirin chewable tablet 81 mg, 81 mg, Oral, Daily, Deepthi Singam, MD, 81 mg at 09/26/18 0839  ???  atorvastatin (LIPITOR) tablet 40 mg, 40 mg, Oral, Nightly, Deepthi Singam, MD, 40 mg at 09/25/18 2057  ???  0.9 % sodium chloride infusion, , Intravenous, Continuous, Elenor Legato, APRN - CNP, Stopped at 09/26/18 1133  ???  ondansetron (ZOFRAN) injection 4 mg, 4 mg, Intravenous, Q6H PRN, Elenor Legato, APRN - CNP  ???  labetalol (NORMODYNE;TRANDATE) injection 10 mg, 10 mg, Intravenous, Q10 Min PRN, Elenor Legato, APRN - CNP  ???  oxyCODONE-acetaminophen (PERCOCET) 5-325 MG per tablet 1 tablet, 1 tablet, Oral, Q6H PRN, Deepthi Singam, MD, 1 tablet at 09/26/18 0839  ???  amLODIPine (NORVASC) tablet 10 mg, 10 mg, Oral, Daily, Inderpartap S Phangureh, MD, 10 mg at 09/26/18 0839  ???  carvedilol (COREG) tablet 25 mg, 25 mg, Oral, BID WC, Inderpartap S Phangureh, MD, 25 mg at 09/26/18 0839  ???  pantoprazole (PROTONIX) tablet 40 mg, 40 mg, Oral, QAM AC, Inderpartap S Phangureh, MD, 40 mg at 09/26/18 0740  ???  sodium chloride flush 0.9 % injection 10 mL, 10 mL, Intravenous, 2 times per day, Sheran Lawless, MD, 10 mL at 09/26/18 0839  ???  sodium chloride flush 0.9 % injection 10 mL, 10 mL, Intravenous, PRN, Sheran Lawless, MD  ???  acetaminophen (TYLENOL) tablet 650 mg, 650 mg, Oral, Q6H PRN, 650 mg at 09/26/18 0017 **OR** acetaminophen (TYLENOL) suppository 650 mg, 650 mg, Rectal, Q6H PRN, Sheran Lawless, MD  ???  polyethylene glycol (GLYCOLAX) packet 17 g, 17 g, Oral, Daily PRN, Sheran Lawless, MD  ???  enoxaparin (LOVENOX) injection 40 mg, 40 mg, Subcutaneous, Daily, Inderpartap S Phangureh, MD, 40 mg at 09/26/18 1048  ???  perflutren lipid microspheres (DEFINITY)  injection 1.65 mg, 1.5 mL, Intravenous, ONCE PRN, Inderpartap S Phangureh, MD  ???  sodium chloride flush 0.9 % injection 10 mL, 10 mL, Intravenous, PRN, Sheran Lawless, MD  ???  lisinopril (PRINIVIL;ZESTRIL) tablet 20 mg, 20 mg, Oral, Daily, Deepthi Singam, MD, 20 mg at 09/26/18 0839  ???  hydrALAZINE (APRESOLINE) injection 10 mg, 10 mg, Intravenous, Q6H PRN, Deepthi Singam, MD, 10 mg at 09/25/18 1159  ???  insulin lispro (HUMALOG) injection vial 25 Units, 25 Units, Subcutaneous, TID WC, Deepthi Singam, MD, 25 Units at 09/26/18 1041  ???  glucose (GLUTOSE) 40 % oral gel 15 g, 15 g, Oral, PRN, Deepthi Singam, MD  ???  dextrose 50 % IV solution, 12.5  g, Intravenous, PRN, Deepthi Singam, MD  ???  glucagon (rDNA) injection 1 mg, 1 mg, Intramuscular, PRN, Deepthi Singam, MD  ???  dextrose 5 % solution, 100 mL/hr, Intravenous, PRN, Deepthi Singam, MD     Continuous Infusions:  ??? sodium chloride Stopped (09/26/18 1133)   ??? dextrose           Physical Examination:    General Exam:    Constitutional: The patient is obese.    HEENT: Head is normocephalic and atraumatic. No eye congestion or eye infection. No ear infection or ear congestion. No nasal congestion or nasal infection. No throat infection or congestion.    Neck: Supple, short, no bruits-bilaterally.    Chest: CTA - Bilaterally, no wheezing.    Abdomen: Soft, non-tender, bowel sounds are positive.    Heart: S1, S2, RRR.    Extremities: No cyanosis, no clubbing, and no edema.    Neurological Exam:    Dexterity: The patient is RIGHT handed.    Mental Status: Alert, Awake, Oriented x 4, Normal Speech and intact comprehension - 3/3 repetition and recall.    Cranial Nerves: CN-II, CN-III, CN-IV, CN-V, CN-VI, CN-VII, CN-VIII, CN-IX, CN-X, CN-XI and, CN-II are within normal limits bilaterally.    Motor:   Bulk = Symmetric and within normal limits bilaterally.   Tone = Symmetric and within normal limits bilaterally.    Strength = 5+/5 in all 4 extremities.   DTRs = Trace  throughout.   Plantar = Down-going bilaterally.   Abnormal movements = None.   Opposition = Normal in both upper extremities.   Pronator Drift = No pronator drift on the RIGHT and, no pronator drift on the LEFT side.    Sensory:   Light touch, pinprick and vibration exams are within normal limits.    Coordination:   Finger to nose is normal on the right side and, on the left side.   Heel to shin is normal on the right side and, on the left side.   Rapid alternation movements are normal on the right side and, on the left side.    Gait:  Normal Gait.    Romberg:  Negative - both eyes open and closed.    NIHSS score:  N/A.      Results:    Labs:    Last 24hrs  Recent Results (from the past 24 hour(s))   POCT Glucose    Collection Time: 09/25/18  5:14 PM   Result Value Ref Range    POC Glucose 154 (H) 70 - 100 mg/dL   POCT Glucose    Collection Time: 09/25/18 10:23 PM   Result Value Ref Range    POC Glucose 168 (H) 70 - 100 mg/dL   CBC Auto Differential    Collection Time: 09/26/18  4:08 AM   Result Value Ref Range    WBC 7.3 3.6 - 10.7 10*3/uL    RBC 4.03 3.80 - 5.20 10*6/uL    Hemoglobin 11.6 (L) 11.7 - 16.0 g/dL    Hematocrit 34.7 (L) 35.0 - 47.0 %    MCV 86.1 79.0 - 98.0 fL    MCH 28.9 26.0 - 34.0 pg    MCHC 33.5 32.0 - 36.0 %    RDW 13.9 11.5 - 14.5 %    Platelets 244 140 - 440 10*3/uL    MPV 8.7 7.4 - 10.4 fL    Granulocytes % 51.9 40.0 - 80.0 %    Lymphocyte % 40.2 (H) 20.0 - 40.0 %  Monocytes 5.7 2.0 - 10.0 %    Eosinophils 1.2 1.0 - 6.0 %    Basophils 1.0 0.0 - 2.0 %    Absolute Neut # 3.8 1.8 - 7.0 10*3/uL    Absolute Lymph # 3.0 1.0 - 4.3 10*3/uL    Absolute Mono # 0.4 0.0 - 0.8 10*3/uL    Absolute Eos # 0.1 0.0 - 0.5 10*3/uL    Absolute Baso # 0.1 0.0 - 0.2 10*3/uL   Hemoglobin A1c    Collection Time: 09/26/18  4:08 AM   Result Value Ref Range    Hemoglobin A1C 6.2 (H) 4.0 - 6.0 %    eAG 131 mg/dL   Troponin    Collection Time: 09/26/18  4:08 AM   Result Value Ref Range    Troponin I 0.020 0.000 - 0.034  ng/mL   Comprehensive Metabolic Panel w/ Reflex to MG    Collection Time: 09/26/18  4:08 AM   Result Value Ref Range    Sodium 135 135 - 145 mmol/L    Potassium 3.8 3.5 - 5.1 mmol/L    Chloride 104 98 - 107 mmol/L    CO2 25 22 - 30 mmol/L    Anion Gap 6 NA    Glucose 62 (L) 70 - 100 mg/dL    BUN 21 (H) 7 - 20 mg/dL    CREATININE 0.93 0.52 - 1.25 mg/dL    eGFR African American >90.0 >60 mL/min    EGFR IF NonAfrican American 78.6 >60 mL/min    Calcium 8.5 8.4 - 10.4 mg/dL    Albumin,Serum 3.6 3.5 - 5.0 g/dL    Total Protein 6.8 6.3 - 8.2 g/dL    Total Bilirubin 0.3 0.2 - 1.3 mg/dL    Alkaline Phosphatase 70 38 - 126 U/L    ALT 37 (H) 0 - 34 U/L    AST 37 15 - 46 U/L   POCT Glucose    Collection Time: 09/26/18  8:26 AM   Result Value Ref Range    POC Glucose 79 70 - 100 mg/dL   POCT Glucose    Collection Time: 09/26/18 10:10 AM   Result Value Ref Range    POC Glucose 78 70 - 100 mg/dL       Since admission:  Recent Labs     09/24/18  1332 09/24/18  1731 09/26/18  0408   TROPONINI 0.026 0.027 0.020     Recent Labs     09/24/18  0253 09/26/18  0408   ALKPHOS 101 70   ALT 31 37*   AST 36 37   BILITOT 0.2 0.3   LABALBU 4.5 3.6   LIPASE 192  --      Stroke Specific Labs:  Lipids:    Recent Labs     09/24/18  0253 09/25/18  0506   CHOL  --  159   LDLCHOLESTEROL  --  68   TRIG  --  210*   HDL  --  49   LIPASE 192  --      HgA1c:    Recent Labs     09/26/18  0408   LABA1C 6.2*     TSH:     Lab Results   Component Value Date    TSH 1.402 09/24/2018        Radiology Personal review:  MRI Brain/MRA Head/Neck 09/26/18  No acute infarct. Scattered foci of T2/FLAIR hyperintense signal within the cerebral white matter, which are nonspecific and be seen with  demyelinating disease, migraine syndrome or vasculitis. Microangiopathy is less common in this age group. No hemodynamically significant narrowing of the major arteries of the neck. No hemodynamically significant narrowing of the major arteries of the circle of Willis or posterior  circulation.     CT Head 09/25/18  No evidence of intracranial hemorrhage. Subtle, vague area of decreased attenuation along the posterior aspect of the left basal ganglia could represent an area of acute ischemic change. MRI would be helpful for additional evaluation.   ??  ECHO 09/25/18  Left ventricle mildly dilated. Estimated EF 48%. Right ventricle has normal systolic function. Left atrium is mildly dilated. Pulmonary arteries systolic pressure is mildly increased. No significant valve disease.      Neurophysiology Results:    EEG: N/A      EMG/NCS: N/A.    ASSESSMENT / PLAN / SUGGESTIONS :     Assessment:    Breakthrough migraine  Poorly controlled HTN  Morbid obesity  Adverse reaction to Sumatriptan  Diabetes  Baseline chronic kidney disease     Patient did not have a stroke.    Recommendations:    Patient denies renal issues - Consider AED vs Antidepressants for migraine prophylaxis per discretion of outpatient neurology, she may benefit more from [facility].     Follow up with outpatient neurology - please consider an LP to rule out pseudotumor cerebri. As well as, outpatient consult to ophthalmology.    Continue ASA 62m and Lipitor 221mnightly     Follow up with PCP for repeat evaluation of lipid panel         Disposition/Discharge issues:    No further neurological workup needed at this time. Neurology will sign off. Patient can be discharged home from our standpoint. Please call with any questions or concerns.     Thank you.    Patient seen and discussed with Dr. AsMacy Mis     Electronically signed by KrElenor LegatoAPRN - CNP on 09/26/2018 at 11:47 AM

## 2018-09-26 NOTE — Progress Notes (Signed)
Patient d/c to home. D/c paperwork reviewed, reviewed home meds, stroke education, f/u appointments. Was given physical paper prescription to take to pharmacy for oxy. All questions answered. Ambulatory left floor with S/O and all belongings

## 2018-09-26 NOTE — Progress Notes (Signed)
Nutrition rescreen completed. Chart reviewed. Patient to be monitored and followed by the diet technician.

## 2018-09-26 NOTE — Discharge Instructions (Signed)

## 2018-09-26 NOTE — Procedures (Signed)
Post Procedure Patient Status  Diagnostic Department  []  Cardiac Cath Lab  []  Cardiology - Diagnostic  []  CT Scan  []  Dialysis  []  Diagnostic Radiology (X-Ray)  []  Endoscopy  []  EP Lab (cardiac)  [x]  MRI  []  Nuclear Medicine  []  Neurology  []  Noninvasive Vascular  []  Pulmonary Function  []  Radiation Therapy  []  Special Procedures (Angio)  []  TEE  []  Ultrasound  []  Breast Center         Dept Phone Extension  []  Dept Phone Extension Number  [phone]       Diagnostic Post Procedure Status  [x]  Test/Procedure performed - tolerated well  []  Test/Procedure performed - see comments:  []  Test/Procedure partially performed - see comments:  []  Test/Procedure not performed - see comments:         Post Procedure Transportation  [x]  Patient transported to their inpatient room  []  Patient transported to the Emergency Dept  []  Patient transported to another diagnostic area  []  Patient transported to SDS  []  Patient transported to PACU  []  Patient transported to Cath Lab Observation         Patient medicated during procedure?  Patient Medicated before, during, or post procedure?  []  YES - see procedure note, MAR and/or obtain report         Patient sedated during procedure?  Patient received conscious sedation for procedure?  []  YES - see procedure note, MAR, and/or obtain report         Patient received contrast for procedure?  Patient received contrast for procedure?  []  YES - see procedure note, MAR and/or obtain report  57ml of gadavist       Specimen(s) collected and sent to Lab per Diagnostic area?  Specimen(s) collected & sent to Lab per Diagnostic area?  []  YES  []  NO       Electronically signed by Lia Hopping on 09/26/2018 at 9:52 AM

## 2018-09-26 NOTE — Progress Notes (Signed)
Speech Language Pathology  Patient passed the Nursing Swallowing Screening and is on a Cardiac diet.  Completed speech orders as per stroke protocol.  Katherinne Mofield, MA, CCC-SLP  09/26/2018

## 2018-09-26 NOTE — Discharge Instructions (Signed)
Your instructions:    Recommended diet: diabetic diet    Recommended activity: activity as tolerated    GENERAL ZONES   GREEN ZONE: All Clear- Your Symptoms Are Under Control    No recurrence of symptoms that led to hospitalization   Able to do usual activities   No fever   No chest pain   No shortness of breath   This Means You Should:   Continue taking your medications as prescribed   Continue activity as tolerated   Keep all doctor appointments    YELLOW ZONE: Caution   Recurrence of symptoms that led to hospitalization   Fever of 100 degrees or higher   Increased fatigue or restlessness    Intolerant side-effects of medications   Uneasy feeling or that something is wrong   This Means You Should:   Call your doctor for further instructions     RED ZONE: Medical Alert   Severe or unrelieved  shortness of breath at rest   Unrelieved chest pain   Confusion or you can't think clearly    This Means You Should Call 911 Immediately         Refer to the Understanding Stroke Booklet given to you, written material provided to patient/family, addressing all signs & symptoms of a stroke, which are:    sudden numbness or weakness, especially on one side of the body   sudden confusion    sudden difficulty speaking or understanding     sudden loss of vision    sudden dizziness or loss of balance or coordination   sudden severe headache  Explained the need to call EMS (911) immediately if signs & symptoms occur. Discussed medications that the patient is taking, will review medications again prior to discharge, risk factors, and the need for follow-up with a physician/APN/PA after discharge.  Electronically signed by Renita PapaABIGAIL J LAVELY, RN on 09/25/2018 at 4:52 PM    Discussed the patient's personal risk factors for Stroke /TIA with patient/family, and ways to reduce the risk for a recurrent stroke. Patient's personal risk factors which were identified are:        [x]  High blood pressure       []  High cholesterol       []  Atrial  fibrillation       [x]  Diabetes       []  Smoking       [x]  Overweight       []  Lack of Exercise       []  Sleep apnea       []  Prior heart disease or heart attack       []  Excessive alcohol use       []  Use of illicit drugs       []  Personal history of previous TIA or stroke       []  Family history of stroke or heart disease       []  Carotid stenosis       []  Heart failure       []  Patent Foramen [place]vale       []  Migraine       []  Hormone replacement therapy       []  Current pregnancy (up to six weeks post partum)       []  Depression       []  Sickle Cell       []  Renal insufficiency - chronic       []  None    Refer to Understanding Stroke Booklet.  Advised patient that risk for stroke/TIA can be reduced by modifying/controlling risk factors. Patient advised to take medications as prescribed, which will be detailed in the discharge instructions, and to not stop taking them without consulting a physician. In addition, pt. advised to maintain a healthy diet, exercise regularly and to not smoke.    Electronically signed by Gwyndolyn Kaufman, RN on 09/25/2018 at 4:52 PM

## 2018-09-26 NOTE — Discharge Summary (Signed)
Hospitalist Discharge Summary    Sierra [name]  DOB:  14-Mar-1980  MRN:  [id number]    Admit date:  09/24/2018  Discharge date:  09/26/18    Admitting Physician:  Sierra BrownieInderpartap S Phangureh, MD  Discharge Physician: Sierra AugustaEEPTHI  [name], MD   Primary Care Physician:  Sierra OregonSEAN WILSON, DO  Visit Status: Observation    Code Status: Full Code    Discharge Diagnoses:    Primary Problem  Hypertensive emergency  Hospital Problems  Active Hospital Problems    Diagnosis Date Noted   ??? Intractable migraine without aura and without status migrainosus [G43.019]    ??? Morbid obesity with body mass index (BMI) of 60.0 to 69.9 in adult (HCC) [E66.01, Z68.44]    ??? Essential hypertension [I10]    ??? Hypertensive emergency [I16.1] 09/24/2018       Hospital Course:         Patient is a morbidly obese (>40.0) 38 y.o. female with past medical history of Obesity, type 2 diabetes, hypertension, migraine headaches who presented  complaining of sudden onset of shortness of breath, discomfort in the center of the chest 30-45 minutes after she took sumatriptan(describes as heaviness), facial flushing,  Denies  abdominal pain, nausea, vomiting, diarrhea, constipation, fevers, or chills.  Work up in the ED:  Blood pressure was elevated to 252/154, tachycardic at 112, afebrile  troponins elevated at 0.036.    She was given 20 mg of labetalol in the Emergency Department which brought the blood pressure down to 190s.  She was given 1 more dose of labetalol 10 mg, hydralazine 10 mg, blood pressure down to 140/100's  -CT head revealed possible acute stroke in left basal ganglia, started on aspirin, statin, consult neurology.  - MRI brain consistent with migraine, vasculitis, no acute stroke.  -Renal Ultrasound unremarkable for renal artery stenosis  -Echo showed borderline ejection fraction of 48%, no valve abnormalities, no wall motion abnormalities.  Patient was symptom free, no chest pain, no shortness of breath, headaches improved on discharge  Discharge  Concerns/Recommendations:  Follow-up with PCP, establish with neurology as outpatient      Procedures: as above  Consults:    IP CONSULT TO NEUROLOGY  IP CONSULT TO NEUROLOGY  IP CONSULT TO CASE MANAGEMENT  Discharge Instructions:   Diet: DIET CARDIAC;   Activity: as tolerated  Follow Up:   Sierra OregonSean Wilson, DO  [address]  [place] [id number]  [phone]    Call in 2 days  [facility]    Sierra Plummermitri Kolychev, MD  [address]  [place] [id number]  [phone]    Schedule an appointment as soon as possible for a visit  Hospital f/u, TOC visit with in week    Disposition:     Patient discharged in stable condition to Home.     Vitals: BP 115/69    Pulse 84    Temp 96.2 ??F (35.7 ??C) (Temporal)    Resp 16    Ht 5\' 6"  (1.676 m)    Wt (!) 413 lb 12.8 oz (187.7 kg)    SpO2 97%    BMI 66.79 kg/m??   Pulse Ox: SpO2  Avg: 97.5 %  Min: 95 %  Max: 99 %  Supplemental O2:    General appearance: alert and cooperative with exam  Lungs: clear to auscultation bilaterally  Heart: regular rate and rhythm, S1, S2 normal, no murmur, click, rub or gallop  Abdomen: soft, non-tender; bowel sounds  normal; no masses,  no organomegaly  Extremities: extremities normal, atraumatic, no cyanosis or edema  Neurologic: No obvious focal neurologic deficits.    Discharge Medications:      Medication List      START taking these medications    aspirin 81 MG chewable tablet  Take 1 tablet by mouth daily  Start taking on:  September 27, 2018     atorvastatin 40 MG tablet  Commonly known as:  LIPITOR  Take 1 tablet by mouth nightly     insulin 70-30 (70-30) 100 UNIT per ML injection vial  Commonly known as:  HUMULIN;NOVOLIN  Inject 90 Units into the skin 2 times daily     insulin lispro 100 UNIT/ML injection vial  Commonly known as:  HUMALOG  Inject 25 Units into the skin 3 times daily (with meals)     oxyCODONE-acetaminophen 5-325 MG per tablet  Commonly known as:  PERCOCET  Take 1 tablet by mouth every 8 hours as needed  for Pain for up to 3 days.        CONTINUE taking these medications    amLODIPine 10 MG tablet  Commonly known as:  NORVASC     carvedilol 25 MG tablet  Commonly known as:  COREG     lisinopril 20 MG tablet  Commonly known as:  PRINIVIL;ZESTRIL     omeprazole 40 MG delayed release capsule  Commonly known as:  PRILOSEC     oxaprozin 600 MG tablet  Commonly known as:  DAYPRO        STOP taking these medications    raNITIdine 150 MG tablet  Commonly known as:  ZANTAC           Where to Get Your Medications      These medications were sent to [facility] [place] [facility] [phone]  [address] [place] [address]    Phone:  [phone]   ?? aspirin 81 MG chewable tablet  ?? atorvastatin 40 MG tablet  ?? insulin 70-30 (70-30) 100 UNIT per ML injection vial  ?? insulin lispro 100 UNIT/ML injection vial     Information about where to get these medications is not yet available    Ask your nurse or doctor about these medications  ?? oxyCODONE-acetaminophen 5-325 MG per tablet         Complexity of Follow up:   []  Moderate Complexity: follow up within 7-14 calendar days (38756)   [x]  Severe Complexity: follow up within 7 calendar days (43329)    Follow up Testing, Pending results or Referrals at Transitional Care Visit:    [x]   yes   []   no     Instructions to MA: Please call patient on day after discharge (must document patient  contacted within 2 business days of discharge).    Follow up questions for MA:  1. Did you get medications filled and taking them as instructed from discharge?  2. Are you following your discharge instructions from your hospital stay?  3. Please confirm patient is scheduled for a follow up appointment within the above time frame.    Time Spent on discharge is  35 mins in patient examination, evaluation, counseling as well as medication reconciliation, prescriptions for required medications, discharge plan and follow up.    Signed:  Samuel Jester, MD   09/26/2018, 11:52 AM    Thank you Dr. Azalee Course, DO for the opportunity  to be involved in this patient's care.

## 2018-09-26 NOTE — Progress Notes (Signed)
Physical Therapy  Received orders for PT eval and treat per Barthel Index. Barthel Index was 100 prior to admit and is 100 on admission. Based on scoring, no PT indicated. Discharge PT.    Electronically signed by Joycelyn Man, PT on 09/26/2018 at 7:00 AM

## 2018-10-03 LAB — RENIN: Renin Activity: 0.3 ng/mL/hr

## 2018-10-04 LAB — METANEPHRINES PLASMA FRACTIONATED
Metanephrines Plasma: 0.13 nmol/L (ref 0.00–0.49)
Normetanephrine: 0.32 nmol/L (ref 0.00–0.89)

## 2018-10-29 ENCOUNTER — Ambulatory Visit
Admit: 2018-10-29 | Discharge: 2018-10-29 | Payer: PRIVATE HEALTH INSURANCE | Attending: Neurology | Primary: Family Medicine

## 2018-10-29 DIAGNOSIS — G43009 Migraine without aura, not intractable, without status migrainosus: Secondary | ICD-10-CM

## 2018-10-29 MED ORDER — RIMEGEPANT SULFATE 75 MG PO TBDP
75 | ORAL_TABLET | ORAL | 2 refills | Status: DC | PRN
Start: 2018-10-29 — End: 2019-02-04

## 2018-10-29 MED ORDER — TOPIRAMATE 25 MG PO TABS
25 MG | ORAL_TABLET | Freq: Every evening | ORAL | 3 refills | Status: DC
Start: 2018-10-29 — End: 2018-11-05

## 2018-10-29 NOTE — Progress Notes (Signed)
Department of Neurological Sciences    10/29/18     CHIEF COMPLAINT:   Chief Complaint   Patient presents with   ??? Headache     For 6-7 months          HISTORY OF PRESENT ILLNESS:     The patient is a 38 y.o.  female with a history of hypertension, diabetes, obstructive sleep apnea not on CPAP who presents for evaluation of migraines.  [place] symptoms began at age 57.  Headaches have worsened in severity and frequency over the past 6 months.  She now has headaches every other day.  Headaches are throbbing, bifrontal and biparietal.  Most of the time they're moderate in severity but can reach 10 out of 10 several days a month.  Headaches last hours.  They're not preceded by visual aura.  But he by lightheadedness, light sensitivity, neck stiffness.  Worsened by physical activity.  Relieved by rest.  Interfere with activities of daily living 7 days per month.  She is not aware of dietary, olfactory, hormonal triggers.  Affirms adequate hydration.  Tries to stay active.  Affirms adequate sleep and denies excessive daytime sleepiness.  Release medications: Motrin, Aleve, Tylenol-0% improvement in headache severity.  Tried sumatriptan but developed chest pain and was admitted to the hospital on 09/24/2018 for hypertensive emergency.  Does not recall using migraine preventatives, Botox, devices, supplements, etc.  Denies jaw claudication, focal neurological symptoms, syncope, frequent nocturnal awakenings, transient visual obscurations    MRI brain, MRA EC/IC 09/26/18:   IMPRESSION: ??    ??    1. No acute infarct.    ??    2. Scattered foci of T2/FLAIR hyperintense signal within the cerebral    white matter, which are nonspecific and be seen with demyelinating    disease, migraine syndrome or vasculitis. Microangiopathy is less    common in this age group.    ??    3. No hemodynamically significant narrowing of the major arteries of    the neck.    ??    4. No hemodynamically significant narrowing of the major arteries of     the circle of Willis or posterior circulation.          Past Medical History:    Past Medical History:   Diagnosis Date   ??? Diabetes mellitus (HCC)    ??? Hypertension         Past Surgical History:   Past Surgical History:   Procedure Laterality Date   ??? CESAREAN SECTION          Medications:   Current Outpatient Medications   Medication Sig Dispense Refill   ??? Rimegepant Sulfate 75 MG TBDP Take 75 mg by mouth as needed (ha) 30 tablet 2   ??? topiramate (TOPAMAX) 25 MG tablet Take 3 tablets by mouth nightly 90 tablet 3   ??? aspirin 81 MG chewable tablet Take 1 tablet by mouth daily 30 tablet 3   ??? insulin 70-30 (HUMULIN;NOVOLIN) (70-30) 100 UNIT per ML injection vial Inject 90 Units into the skin 2 times daily (Patient taking differently: Inject 25 Units into the skin 3 times daily ) 1 vial 3   ??? insulin lispro (HUMALOG) 100 UNIT/ML injection vial Inject 25 Units into the skin 3 times daily (with meals) (Patient taking differently: Inject 90 Units into the skin 2 times daily ) 1 vial 3   ??? atorvastatin (LIPITOR) 20 MG tablet Take 1 tablet by mouth nightly 30 tablet 3   ???  omeprazole (PRILOSEC) 40 MG delayed release capsule TAKE 1 CAPSULE BY MOUTH ONCE DAILY 30 MINUTES BEFORE MORNING MEAL     ??? lisinopril (PRINIVIL;ZESTRIL) 20 MG tablet Take 20 mg by mouth     ??? carvedilol (COREG) 25 MG tablet Take 25 mg by mouth 2 times daily      ??? amLODIPine (NORVASC) 10 MG tablet Take 10 mg by mouth     ??? oxaprozin (DAYPRO) 600 MG tablet Take 600 mg by mouth       No current facility-administered medications for this visit.         Allergies:  Sumatriptan    Social History:  Social History     Socioeconomic History   ??? Marital status: Divorced     Spouse name: Not on file   ??? Number of children: Not on file   ??? Years of education: Not on file   ??? Highest education level: Not on file   Occupational History   ??? Not on file   Social Needs   ??? Financial resource strain: Not on file   ??? Food insecurity     Worry: Not on file      Inability: Not on file   ??? Transportation needs     Medical: Not on file     Non-medical: Not on file   Tobacco Use   ??? Smoking status: Former Smoker     Years: 0.50     Types: Cigarettes   ??? Smokeless tobacco: Never Used   Substance and Sexual Activity   ??? Alcohol use: Not Currently   ??? Drug use: Not Currently   ??? Sexual activity: Not on file   Lifestyle   ??? Physical activity     Days per week: Not on file     Minutes per session: Not on file   ??? Stress: Not on file   Relationships   ??? Social Wellsite geologistconnections     Talks on phone: Not on file     Gets together: Not on file     Attends religious service: Not on file     Active member of club or organization: Not on file     Attends meetings of clubs or organizations: Not on file     Relationship status: Not on file   ??? Intimate partner violence     Fear of current or ex partner: Not on file     Emotionally abused: Not on file     Physically abused: Not on file     Forced sexual activity: Not on file   Other Topics Concern   ??? Not on file   Social History Narrative   ??? Not on file        Family History:   Family History   Problem Relation Age of Onset   ??? Diabetes Mother    ??? High Blood Pressure Mother    ??? High Blood Pressure Father    ??? Diabetes Father           REVIEW OF SYSTEMS:  Review of Systems   Constitutional: Negative for fever and unexpected weight change.   HENT: Positive for tinnitus. Negative for hearing loss and trouble swallowing.    Eyes: Positive for photophobia. Negative for visual disturbance.   Respiratory: Negative for choking, chest tightness and shortness of breath.    Cardiovascular: Negative for chest pain and palpitations.   Gastrointestinal: Negative for abdominal pain and nausea.   Endocrine: Negative for cold intolerance and  heat intolerance.   Genitourinary: Negative for difficulty urinating, dysuria and urgency.   Musculoskeletal: Negative for arthralgias, gait problem and myalgias.   Neurological: Positive for dizziness and headaches.  Negative for seizures and weakness.   Psychiatric/Behavioral: Negative for confusion and dysphoric mood. The patient is not nervous/anxious.         PHYSICAL EXAM:  Vitals:  BP (!) 167/115 (Site: Right Lower Arm, Position: Sitting, Cuff Size: Large Adult) Comment: Dr. Hewitt Shorts aware of BPs   Pulse 92    Resp 16    Ht 5\' 6"  (1.676 m)    Wt (!) 406 lb 3.2 oz (184.3 kg)    BMI 65.56 kg/m??     General: The patient was obese, in no acute distress.  HEENT: Normocephalic, atraumatic. Conjunctiva, lids, pupils, and irises clear. Oral mucosa is moist.  Neck: Supple.  No carotid bruits.  Full range of motion.  No nuchal rigidity or neck tenderness to palpation.  Cardiovascular: Regular rate and rhythm.  No murmurs.  Arms and legs are warm and well-perfused.  No clubbing, cyanosis or edema.  Lungs: Clear to auscultation.  Abdomen: Soft, non-tender, non-distended.  Positive bowel sounds.  Skin (Restricted to face and distal upper and lower extremities): Unremarkable.  Musculoskeletal: No tenderness observed.     Neurological Examination:  Mental Status: Patient is currently awake, alert, and oriented to person, place, and time.  Able to state the reason for today's visit and can recall events leading up to this visit.  Speech is fluent without any evidence of dysphasia.     Cranial Nerves:  II Optic: Pupils equal and reactive.  Visual fields full.  No papilledema appreciated on fundoscopic exam.  III Oculomotor, IV Trochlear, VI Abducens:  Extraocular movements intact.  No nystagmus.  No gaze palsy or paresis.  No ptosis.  V Trigeminal: Facial sensation normal and symmetric in V1-V3 distribution.  VII Facial: Facial strength normal and symmetric.  VIII Vestibulocochlear: Hearing intact bilaterally.  IX Glossopharyngeal / X Vagus: Palate elevates symmetrically and uvula midline.  XI Accessory: Shoulder shrug symmetric.   XII Hypoglossal: Tongue midline with normal bilateral strength.     Motor Examination: Full 5/5 strength in  bilateral upper and lower extremities to confrontation.  Normal muscle bulk.  No obvious atrophy or fasciculations seen.  No pronator drift.  Normal tone.  No rigidity.  No tremor.     Sensory Examination: Sensation intact to light touch, pinprick, temperature, and vibration bilaterally throughout.  No extinction to double simultaneous stimulation.     Reflexes: 2+ symmetric reflexes in biceps, triceps, brachioradialis, patella, and Achilles bilaterally. No clonus.     Cerebellar Examination: There is no overt dysmetria nor dysdiadochokinesia with finger-to-nose or rapid alternating movements.     Gait Examination: No difficulty standing from sitting position.  Steady and symmetrical gait with normal stride length, arm swing, and turning without instability.      Impression:   Diagnosis Orders   1. Migraine without aura and without status migrainosus, not intractable     Presents with frequent migraines without aura in setting of morbid obesity.  Developed chest pain with sumatriptan.  No relief with NSAIDs.  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    Plan:   Discussed importance of diet, exercise, sleep in migraine prevention.  Start headache diary.  Start Topamax and taper up to 75mg  at bedtime for migraine prophylaxis.  Provided prescription for Nurtec prn.  Electronically signed by Renold Don, MD on 10/29/18 at 8:58 AM EDT

## 2018-11-05 MED ORDER — TOPIRAMATE 25 MG PO TABS
25 MG | ORAL_TABLET | Freq: Every evening | ORAL | 3 refills | Status: DC
Start: 2018-11-05 — End: 2019-02-04

## 2018-11-05 NOTE — Telephone Encounter (Signed)
Received fax stating pt's insurance would only cover a max of 2 tablets of Topamax a day. Consulted with Dr. Hewitt Shorts and he wants a new script for two tabs daily ordered.  Order placed and sent to him for approval.

## 2019-02-04 ENCOUNTER — Ambulatory Visit
Admit: 2019-02-04 | Discharge: 2019-02-04 | Payer: PRIVATE HEALTH INSURANCE | Attending: Neurology | Primary: Family Medicine

## 2019-02-04 DIAGNOSIS — R519 Headache, unspecified: Secondary | ICD-10-CM

## 2019-02-04 MED ORDER — TIZANIDINE HCL 4 MG PO TABS
4 MG | ORAL_TABLET | Freq: Three times a day (TID) | ORAL | 2 refills | Status: DC | PRN
Start: 2019-02-04 — End: 2020-08-28

## 2019-02-04 MED ORDER — RIMEGEPANT SULFATE 75 MG PO TBDP
75 | ORAL_TABLET | ORAL | 3 refills | Status: DC | PRN
Start: 2019-02-04 — End: 2019-08-04

## 2019-02-04 MED ORDER — TOPIRAMATE 100 MG PO TABS
100 MG | ORAL_TABLET | ORAL | 2 refills | Status: DC
Start: 2019-02-04 — End: 2019-03-22

## 2019-02-04 NOTE — Telephone Encounter (Signed)
I wanted to let you know that I submitted the prior auth for patients Nurtec. I will keep you updated on the status.

## 2019-02-04 NOTE — Progress Notes (Signed)
Department of Neurological Sciences    02/04/19       CHIEF COMPLAINT:   Chief Complaint   Patient presents with   ??? Follow-up     3 mo fu          HISTORY OF PRESENT ILLNESS:     The patient is a 39 y.o.  female with a history of hypertension, diabetes, obstructive sleep apnea not on CPAP who presents for follow up of migraines.    10/29/18: symptoms began at age 36.  Headaches have worsened in severity and frequency over the past 6 months.  She now has headaches every other day.  Headaches are throbbing, bifrontal and biparietal.  Most of the time they're moderate in severity but can reach 10 out of 10 several days a month.  Headaches last hours.  They're not preceded by visual aura.  But he by lightheadedness, light sensitivity, neck stiffness.  Worsened by physical activity.  Relieved by rest.  Interfere with activities of daily living 7 days per month.  She is not aware of dietary, olfactory, hormonal triggers.  Affirms adequate hydration.  Tries to stay active.  Affirms adequate sleep and denies excessive daytime sleepiness.  Tried medications: Motrin, Aleve, Tylenol-0% improvement in headache severity.  Tried sumatriptan but developed chest pain and was admitted to the hospital on 09/24/2018 for hypertensive emergency.  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   Does not recall using migraine preventatives, Botox, devices, supplements, etc.  Denies jaw claudication, focal neurological symptoms, syncope, frequent nocturnal awakenings, transient visual obscurations.  Last eye exam in the fall of 2020 was benign.    02/04/19:  Persistent headaches are unchanged in character.  Majority of them now are 10/10 in severity.  They now occur daily.  She uses advil rapid release for headaches and postoperative incision pain (gallbladder removed) daily with 50% improvement in headache severity.  She is compliant with topamax 50mg  at bedtime, no side effects identified.  Affirms adequate hydration.   Did not obtain nurtec.  Denies significant nausea associated with headaches.            Past Medical History:    Past Medical History:   Diagnosis Date   ??? Diabetes mellitus ([place])    ??? Hypertension         Past Surgical History:   Past Surgical History:   Procedure Laterality Date   ??? CESAREAN SECTION     ??? CHOLECYSTECTOMY          Medications:   Current Outpatient Medications   Medication Sig Dispense Refill   ??? gabapentin (NEURONTIN) 300 MG capsule Take 300 mg by mouth 3 times daily.     ??? Rimegepant Sulfate 75 MG TBDP Take 75 mg by mouth as needed (ha) 8 tablet 3   ??? topiramate (TOPAMAX) 100 MG tablet Take 1 tablet by mouth See Admin Instructions Half tab twice daily for 1 week then 1 tab twice a day 60 tablet 2   ??? tiZANidine (ZANAFLEX) 4 MG tablet Take 1 tablet by mouth every 8 hours as needed (breakthrough migraine) 12 tablet 2   ??? aspirin 81 MG chewable tablet Take 1 tablet by mouth daily 30 tablet 3   ??? insulin 70-30 (HUMULIN;NOVOLIN) (70-30) 100 UNIT per ML injection vial Inject 90 Units into the skin 2 times daily (Patient taking differently: Inject 25 Units into the skin 3 times daily ) 1 vial 3   ??? insulin lispro (HUMALOG) 100 UNIT/ML injection vial  Inject 25 Units into the skin 3 times daily (with meals) (Patient taking differently: Inject 90 Units into the skin 2 times daily ) 1 vial 3   ??? atorvastatin (LIPITOR) 20 MG tablet Take 1 tablet by mouth nightly 30 tablet 3   ??? lisinopril (PRINIVIL;ZESTRIL) 20 MG tablet Take 25 mg by mouth      ??? carvedilol (COREG) 25 MG tablet Take 25 mg by mouth 2 times daily      ??? amLODIPine (NORVASC) 10 MG tablet Take 10 mg by mouth     ??? oxaprozin (DAYPRO) 600 MG tablet Take 600 mg by mouth     ??? omeprazole (PRILOSEC) 40 MG delayed release capsule TAKE 1 CAPSULE BY MOUTH ONCE DAILY 30 MINUTES BEFORE MORNING MEAL       No current facility-administered medications for this visit.         Allergies:  Sumatriptan    Social History:  Social History     Socioeconomic History    ??? Marital status: Divorced     Spouse name: Not on file   ??? Number of children: Not on file   ??? Years of education: Not on file   ??? Highest education level: Not on file   Occupational History   ??? Not on file   Social Needs   ??? Financial resource strain: Not on file   ??? Food insecurity     Worry: Not on file     Inability: Not on file   ??? Transportation needs     Medical: Not on file     Non-medical: Not on file   Tobacco Use   ??? Smoking status: Former Smoker     Years: 0.50     Types: Cigarettes   ??? Smokeless tobacco: Never Used   Substance and Sexual Activity   ??? Alcohol use: Not Currently   ??? Drug use: Not Currently   ??? Sexual activity: Not on file   Lifestyle   ??? Physical activity     Days per week: Not on file     Minutes per session: Not on file   ??? Stress: Not on file   Relationships   ??? Social Wellsite geologist on phone: Not on file     Gets together: Not on file     Attends religious service: Not on file     Active member of club or organization: Not on file     Attends meetings of clubs or organizations: Not on file     Relationship status: Not on file   ??? Intimate partner violence     Fear of current or ex partner: Not on file     Emotionally abused: Not on file     Physically abused: Not on file     Forced sexual activity: Not on file   Other Topics Concern   ??? Not on file   Social History Narrative   ??? Not on file        Family History:   Family History   Problem Relation Age of Onset   ??? Diabetes Mother    ??? High Blood Pressure Mother    ??? High Blood Pressure Father    ??? Diabetes Father           REVIEW OF SYSTEMS:  Review of Systems   Constitutional: Negative for fever and unexpected weight change.   HENT: Negative for hearing loss, tinnitus and trouble swallowing.    Eyes:  Positive for photophobia. Negative for visual disturbance.   Respiratory: Negative for choking, chest tightness and shortness of breath.    Cardiovascular: Negative for chest pain and palpitations.   Gastrointestinal: Negative  for abdominal pain and nausea.   Endocrine: Negative for cold intolerance and heat intolerance.   Genitourinary: Negative for difficulty urinating, dyspareunia and urgency.   Musculoskeletal: Negative for arthralgias, gait problem and myalgias.   Neurological: Positive for headaches. Negative for dizziness, seizures and weakness.   Psychiatric/Behavioral: Negative for confusion and dysphoric mood. The patient is not nervous/anxious.         PHYSICAL EXAM:  Vitals:  BP (!) 175/116 (Site: Right Lower Arm, Position: Sitting, Cuff Size: Medium Adult)    Pulse 89    Temp 96.8 ??F (36 ??C) (Infrared)    Ht 5\' 6"  (1.676 m)    Wt (!) 385 lb 6.4 oz (174.8 kg)    BMI 62.21 kg/m??     General: The patient was obese, in no acute distress.  HEENT: Normocephalic, atraumatic. Conjunctiva, lids, pupils, and irises clear. Oral mucosa is moist.  Skin (Restricted to face and distal upper and lower extremities): Unremarkable.  Musculoskeletal: No tenderness observed.  ??  Neurological Examination:  Mental Status: Patient is currently awake, alert, and oriented to person, place, and time.?? Able to state the reason for today's visit and can recall events leading up to this visit.?? Speech is fluent without any evidence of dysphasia.  ??  Cranial Nerves:  II Optic: Pupils equal and reactive.?? Visual fields full.?? III Oculomotor, IV Trochlear, VI Abducens:?? Extraocular movements intact.?? No nystagmus.?? No gaze palsy or paresis.?? No ptosis.  V Trigeminal: Facial sensation normal and symmetric in V1-V3 distribution.  VII Facial: Facial strength normal and symmetric.  VIII Vestibulocochlear: Hearing intact bilaterally.  IX Glossopharyngeal / X Vagus: Palate elevates symmetrically and uvula midline.  XI Accessory: Shoulder shrug symmetric.??  XII Hypoglossal: Tongue midline with normal bilateral strength.  ??  Motor Examination: Full 5/5 strength in bilateral upper and lower extremities to confrontation..  ??  Sensory Examination: Sensation intact to  light touch  ??  ??  Gait Examination: No difficulty standing from sitting position.??       Impression:   Diagnosis Orders   1. Chronic daily headache     Continues with chronic daily headaches on topamax 50mg  qhs.    Plan:   Taper up topamax to 100mg  BID gradually.  Sent rx for nurtec to as she is unable to tolerate triptans, instructed patient to call clinic if she is unable to obtain the medication.  Can use tizandine prn intractable pain.  Instructed to limit analgesics to <3 days per week and to not use it for headaches of mild-moderate severity.       Electronically signed by , MD on 02/04/19 at 9:18 AM EST

## 2019-02-07 NOTE — Telephone Encounter (Signed)
Medication:  Nurtec  Dosing Schedule:    Prior Authorization:   Submitted date: 02/04/2019  PA reference #: [id number]  Approval dates: 02/04/2019-08/04/2019    Total Copay: $ 0    Specialty Pharmacy: [facility]  Phone Number: [phone]

## 2019-02-08 MED ORDER — UBRELVY 50 MG PO TABS
50 | ORAL_TABLET | ORAL | 2 refills | Status: DC | PRN
Start: 2019-02-08 — End: 2019-04-04

## 2019-02-08 NOTE — Telephone Encounter (Signed)
When reviewing medication with patient, identified drug interaction with newly prescribed Nurtec and carvedilol.     Per [facility], carvedilol can significantly increase the serum concentration of Nurtec thus the prescribing information recommends this combination be avoided. Defined at category X interaction.     After further investigating, carvedilol can also interact with [name], but it is defined as a category D interaction. Carvedilol can also increase Ubrelvy concentrations, but prescribing information advises that Ubrelvy 50 mg dosing is appropriate. Pt would still need to be advised of potential for increase risk of side effects and importance of monitoring specifically for increase risk of drowsiness, nausea, and dry mouth.     Please advise if switching to Ubrelvy 50 mg would be appropriate for this patient at this time or if a different alternative is preferred.     Thank you for your time!

## 2019-02-08 NOTE — Telephone Encounter (Signed)
I wanted to let you know that I submitted the prior auth for the patients Ubrelvy. I will keep you updated on the status. Thank you

## 2019-02-09 NOTE — Telephone Encounter (Signed)
The Ubrelvy 50 mg script was written for # 6 tablets, but we are unable to break open the boxes. Is it okay to amend the rx for # 10 tablets?    Thank you for your time!

## 2019-02-10 NOTE — Telephone Encounter (Signed)
SUBJECTIVE    Sierra [name] is a 39 year old Female who was referred to [facility] for clinical management services for Ubrelvy 50 MG.      Diagnosis Headache R51    OBJECTIVE    Medications    Aspirin 81 MG CHEW PO  Atorvastatin Calcium 20 MG TABS PO  Carvedilol 25 MG TABS PO  Gabapentin 300 MG CAPS PO  HumaLOG 100 UNIT/ML SOLN SC  HumuLIN 70/30 (70-30) 100 UNIT/ML SUSP SC  Lisinopril 20 MG TABS PO  Omeprazole 40 MG CPDR PO  Oxaprozin 600 MG TABS PO  Topamax 100 MG TABS PO  Ubrelvy 50 MG TABS PO  Zanaflex 4 MG TABS PO  amLODIPine Besylate 10 MG TABS PO    Allergies    sumatriptan      DC Ubrelvy 02/10/2019  Baseline Migraine (Days/Month)  Assessment Date: 02/09/2019  Value: 30    Baseline Headache (days/month)  Assessment Date: 02/09/2019  Value: 30    Days missed of planned activity due to headache  Assessment Date: 02/09/2019  Value: 30    Total number of migraine abortives used (#/month)  Assessment Date: 02/09/2019  Value: 0    Number of secondary abortive dose used (#/month)  Assessment Date: 02/09/2019  Value: 0    SUBJECTIVE    Sierra [name] is a 39 year old Female who was referred to [facility] for clinical management services for Ubrelvy 50 MG.  Diagnosis Headache R51    OBJECTIVE    Medications    Aspirin 81 MG CHEW PO  Atorvastatin Calcium 20 MG TABS PO  Carvedilol 25 MG TABS PO  Gabapentin 300 MG CAPS PO  HumaLOG 100 UNIT/ML SOLN SC  HumuLIN 70/30 (70-30) 100 UNIT/ML SUSP SC  Lisinopril 20 MG TABS PO  Omeprazole 40 MG CPDR PO  Oxaprozin 600 MG TABS PO  Topamax 100 MG TABS PO  Ubrelvy 50 MG TABS PO  Zanaflex 4 MG TABS PO  amLODIPine Besylate 10 MG TABS PO    Allergies    sumatriptan    DC Ubrelvy 02/10/2019  Baseline Migraine (Days/Month)  Assessment Date: 02/09/2019  Value: 30    Baseline Headache (days/month)  Assessment Date: 02/09/2019  Value: 30    Days missed of planned activity due to headache  Assessment Date: 02/09/2019  Value: 30    Total number of migraine abortives  used (#/month)  Assessment Date: 02/09/2019  Value: 0    Number of secondary abortive dose used (#/month)  Assessment Date: 02/09/2019  Value: 0    ASSESSMENT / PLAN    Ubrelvy 50 MG    Expectations and Goals of therapy    The purpose of the medication(s) is to reduce the burden of headaches.    Disease state education    Reviewed that headaches are a dynamic and often chronic disease state. Reviewed mitigation strategies.    Administration    Roselyn Meier is administered orally without regard to food when a migraine occurs. Roselyn Meier is meant to be taken on an as needed basis when headaches occur and not to be taken routinely. Unless instructed otherwise by the provider, the patient will continue all current preventative and abortive headache agents. Counseled on administration directions and techniques. The patient may take one dose, and if the headache is not fully aborted, may take a second dose 2 hours after the first. The patient is limited to 200mg /24 hours and may treat up to 8 headaches/month with the medication.  Storage/Disposal    Counseled that proper storage of medication is at room temperature (68-38F) in the original packaging. Temperature excursions allowed between 59-86 F.    Side effects    There are no major side effects to counsel on with Ubrelvy as it is generally well tolerated. The patient is urged to reach out if they feel that they are experiencing an adverse reaction to the medication.    Contact    Advised when to contact pharmacy or provider. Provided with direct number to clinical pharmacist for questions or concerns prior to scheduled follow up. The patient was oriented to the pharmacy???s services upon their initial fill and it was communicated that they can participate in this plan of care by speaking with a Pharmacist which is offered during each reassessment.    Adherence    Since this is an abortive therapy, the patient is to take 1-2 doses as needed with the start of a  headache.    Monitoring and follow up    The patient will be routinely monitored through electronic and in-person methods for the cluster period response to the therapy. It is essential that the patient remain in contact with the pharmacy and provider to optimize their safety and care.    ASSESSMENT / PLAN    Ubrelvy 50 MG    Expectations and Goals of therapy    The purpose of the medication(s) is to reduce the burden of headaches.    Disease state education    Reviewed that headaches are a dynamic and often chronic disease state. Reviewed mitigation strategies.    Administration    Sierra [name] is administered orally without regard to food when a migraine occurs. Sierra [name] is meant to be taken on an as needed basis when headaches occur and not to be taken routinely. Unless instructed otherwise by the provider, the patient will continue all current preventative and abortive headache agents. Counseled on administration directions and techniques. The patient may take one dose, and if the headache is not fully aborted, may take a second dose 2 hours after the first. The patient is limited to 200mg /24 hours and may treat up to 8 headaches/month with the medication.    Storage/Disposal    Counseled that proper storage of medication is at room temperature (68-38F) in the original packaging. Temperature excursions allowed between 59-86 F.    Side effects    There are no major side effects to counsel on with Ubrelvy as it is generally well tolerated. The patient is urged to reach out if they feel that they are experiencing an adverse reaction to the medication.    Contact    Advised when to contact pharmacy or provider. Provided with direct number to clinical pharmacist for questions or concerns prior to scheduled follow up. The patient was oriented to the pharmacy???s services upon their initial fill and it was communicated that they can participate in this plan of care by speaking with a Pharmacist which is offered during each  reassessment.    Adherence    Since this is an abortive therapy, the patient is to take 1-2 doses as needed with the start of a headache.    Monitoring and follow up    The patient will be routinely monitored through electronic and in-person methods for the cluster period response to the therapy. It is essential that the patient remain in contact with the pharmacy and provider to optimize their safety and care.

## 2019-02-18 NOTE — Telephone Encounter (Signed)
Medication: Ubrelvy 50 mg  Dosing Schedule:     Prior Authorization:  Submitted date: 02/08/19  PA reference #: [id number]  Denied date: 02/08/19  PA denial reason(s): Does not meet criteria     Appeal:  Submitted date: 02/09/19  Approval dates: 02/09/19 - 08/09/19    Total Copay: $0.00     Specialty Pharmacy: [facility]  Phone Number: [phone]

## 2019-02-18 NOTE — Telephone Encounter (Signed)
Spoke with pt over telephone. She has had one dose of the Ubrelvy 50 mg and did notice increased drowsiness. We reviewed the drug interaction between [place] and amlodipine/carvedilol which can increase Ubrelvy concentrations making her more likely to experience drowsiness. She reports it was not severe and she is comfortable continuing to take the Ubrelvy 50 mg. Advised her not to drive after taking the medication due to drowsiness. Encouraged her to report this to [place] if the drowsiness persists or becomes unbearable after taking the medication.

## 2019-03-22 MED ORDER — TOPIRAMATE 50 MG PO TABS
50 MG | ORAL_TABLET | ORAL | 2 refills | Status: DC
Start: 2019-03-22 — End: 2019-04-04

## 2019-03-22 NOTE — Telephone Encounter (Signed)
Spoke with patient and relayed Dr. Ocie Doyne message.  Patient verbalzed understanding. She asked if she can just take the 100mg  at night and I told her she can until she runs out. Patient verbalized understanding.

## 2019-03-22 NOTE — Telephone Encounter (Signed)
Patient is calling asking if Topamax 25 mg can be called in, the patient states that 100 mg is too strong during the day and when she tries to break the 100 mg it shatters    Please advise

## 2019-03-31 NOTE — Telephone Encounter (Addendum)
Medication:  Topiramate 50 mg   Dosing Schedule: Take one tablet in the morning and two tablets at night     Prior Authorization:  Submitted date: 03/31/19  Submission method: Fax     Covered medication.     Total Copay: $0.00     Specialty Pharmacy: [facility]  Phone Number: [phone]

## 2019-04-04 ENCOUNTER — Ambulatory Visit
Admit: 2019-04-04 | Discharge: 2019-04-04 | Payer: PRIVATE HEALTH INSURANCE | Attending: Neurology | Primary: Family Medicine

## 2019-04-04 DIAGNOSIS — G43009 Migraine without aura, not intractable, without status migrainosus: Secondary | ICD-10-CM

## 2019-04-04 MED ORDER — TOPIRAMATE 50 MG PO TABS
50 MG | ORAL_TABLET | ORAL | 5 refills | Status: DC
Start: 2019-04-04 — End: 2019-08-04

## 2019-04-04 MED ORDER — UBRELVY 50 MG PO TABS
50 MG | ORAL_TABLET | ORAL | 5 refills | Status: DC | PRN
Start: 2019-04-04 — End: 2020-08-28

## 2019-04-04 NOTE — Progress Notes (Signed)
Department of Neurological Sciences    04/04/19       CHIEF COMPLAINT:   Chief Complaint   Patient presents with   ??? Follow-up     5 mo f/u          HISTORY OF PRESENT ILLNESS:     ??  The patient is a??39 y.o.????female??with a history of hypertension, diabetes, obstructive sleep apnea not on CPAP??who presents??for follow up of migraines.  ??  10/29/18: symptoms began at age 55. ??Headaches have worsened in severity and frequency over the past 6 months. ??She now has headaches every other day. ??Headaches are throbbing, bifrontal and biparietal. ??Most of the time they're moderate in severity but can reach 10 out of 10 several days a month. ??Headaches last hours. ??They're not preceded by visual aura. ??But he by lightheadedness, light sensitivity, neck stiffness. ??Worsened by physical activity. ??Relieved by rest. ??Interfere with activities of daily living 7 days per month.????She is not aware of dietary, olfactory, hormonal triggers. ??Affirms adequate hydration. ??Tries to stay active.????Affirms adequate sleep and denies excessive daytime sleepiness. ??Tried medications: Motrin, Aleve, Tylenol-0% improvement in headache severity. ??Tried sumatriptan but developed chest pain and was admitted to the hospital on 09/24/2018 for hypertensive emergency.  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   Does not recall using migraine preventatives, Botox, devices, supplements,??etc. ??Denies jaw claudication, focal neurological symptoms, syncope, frequent nocturnal awakenings, transient visual obscurations.  Last eye exam in the fall of 2020 was benign.  ??  02/04/19:  Persistent headaches are unchanged in character.  Majority of them now are 10/10 in severity.  They now occur daily.  She uses advil rapid release for headaches and postoperative incision pain (gallbladder removed) daily with 50% improvement in headache severity.  She is compliant with topamax 50mg  at bedtime, no side effects identified.  Affirms adequate  hydration.  Did not obtain nurtec.  Denies significant nausea associated with headaches.  ??    04/04/19:  Reports that headaches are under control.  Reports having 3 headaches in the last month.  She finds Ubrelvy helpful.  Compliant with Topamax 50 mg in the a.m. and 100 mg at night without significant side effects.  Was unable to taper further due to drowsiness.  No new symptoms identified.            Past Medical History:    Past Medical History:   Diagnosis Date   ??? Arthritis     In back   ??? Diabetes mellitus ([place])    ??? Hypertension         Past Surgical History:   Past Surgical History:   Procedure Laterality Date   ??? CESAREAN SECTION     ??? CHOLECYSTECTOMY          Medications:   Current Outpatient Medications   Medication Sig Dispense Refill   ??? Ubrogepant (UBRELVY) 50 MG TABS Take 50 mg by mouth as needed (migraine) 6 tablet 5   ??? topiramate (TOPAMAX) 50 MG tablet Take 1 tablet by mouth See Admin Instructions 1 tab in AM and 2 tabs at night 90 tablet 5   ??? gabapentin (NEURONTIN) 300 MG capsule Take 300 mg by mouth 3 times daily.     ??? Rimegepant Sulfate 75 MG TBDP Take 75 mg by mouth as needed (ha) 8 tablet 3   ??? tiZANidine (ZANAFLEX) 4 MG tablet Take 1 tablet by mouth every 8 hours as needed (breakthrough migraine) 12 tablet 2   ??? aspirin 81  MG chewable tablet Take 1 tablet by mouth daily 30 tablet 3   ??? insulin 70-30 (HUMULIN;NOVOLIN) (70-30) 100 UNIT per ML injection vial Inject 90 Units into the skin 2 times daily (Patient taking differently: Inject 25 Units into the skin 3 times daily ) 1 vial 3   ??? insulin lispro (HUMALOG) 100 UNIT/ML injection vial Inject 25 Units into the skin 3 times daily (with meals) (Patient taking differently: Inject 90 Units into the skin 2 times daily ) 1 vial 3   ??? atorvastatin (LIPITOR) 20 MG tablet Take 1 tablet by mouth nightly 30 tablet 3   ??? oxaprozin (DAYPRO) 600 MG tablet Take 600 mg by mouth     ??? omeprazole (PRILOSEC) 40 MG delayed release capsule TAKE 1 CAPSULE BY  MOUTH ONCE DAILY 30 MINUTES BEFORE MORNING MEAL     ??? lisinopril (PRINIVIL;ZESTRIL) 20 MG tablet Take 25 mg by mouth      ??? carvedilol (COREG) 25 MG tablet Take 25 mg by mouth 2 times daily      ??? amLODIPine (NORVASC) 10 MG tablet Take 10 mg by mouth       No current facility-administered medications for this visit.         Allergies:  Sumatriptan    Social History:  Social History     Socioeconomic History   ??? Marital status: Single     Spouse name: Not on file   ??? Number of children: Not on file   ??? Years of education: Not on file   ??? Highest education level: Not on file   Occupational History   ??? Not on file   Social Needs   ??? Financial resource strain: Not on file   ??? Food insecurity     Worry: Not on file     Inability: Not on file   ??? Transportation needs     Medical: Not on file     Non-medical: Not on file   Tobacco Use   ??? Smoking status: Former Smoker     Years: 0.50     Types: Cigarettes   ??? Smokeless tobacco: Never Used   Substance and Sexual Activity   ??? Alcohol use: Not Currently   ??? Drug use: Not Currently   ??? Sexual activity: Not on file   Lifestyle   ??? Physical activity     Days per week: Not on file     Minutes per session: Not on file   ??? Stress: Not on file   Relationships   ??? Social Wellsite geologist on phone: Not on file     Gets together: Not on file     Attends religious service: Not on file     Active member of club or organization: Not on file     Attends meetings of clubs or organizations: Not on file     Relationship status: Not on file   ??? Intimate partner violence     Fear of current or ex partner: Not on file     Emotionally abused: Not on file     Physically abused: Not on file     Forced sexual activity: Not on file   Other Topics Concern   ??? Not on file   Social History Narrative   ??? Not on file        Family History:   Family History   Problem Relation Age of Onset   ??? Diabetes Mother    ???  High Blood Pressure Mother    ??? High Blood Pressure Father    ??? Diabetes Father            REVIEW OF SYSTEMS:  Review of Systems   Constitutional: Negative for fever and unexpected weight change.   HENT: Negative for hearing loss, tinnitus and trouble swallowing.    Eyes: Negative for photophobia and visual disturbance.   Respiratory: Negative for choking, chest tightness and shortness of breath.    Cardiovascular: Negative for chest pain and palpitations.   Gastrointestinal: Positive for abdominal pain and nausea.   Endocrine: Negative for cold intolerance and heat intolerance.   Genitourinary: Negative for difficulty urinating, dyspareunia and urgency.   Musculoskeletal: Negative for arthralgias, gait problem and myalgias.   Neurological: Positive for headaches. Negative for dizziness, seizures and weakness.   Psychiatric/Behavioral: Negative for confusion and dysphoric mood. The patient is not nervous/anxious.         PHYSICAL EXAM:  Vitals:  BP (!) 175/107 (Site: Right Lower Arm, Position: Sitting, Cuff Size: Medium Adult)    Pulse 98    Temp 97.5 ??F (36.4 ??C) (Infrared)    Ht 5\' 6"  (1.676 m)    Wt (!) 378 lb (171.5 kg)    BMI 61.01 kg/m??     ??  General: The patient was??obese, in no acute distress.  HEENT: Normocephalic, atraumatic. Conjunctiva, lids, pupils, and irises clear. Oral mucosa is moist.  Skin (Restricted to face and distal upper and lower extremities): Unremarkable.  Musculoskeletal: No tenderness observed.  ??  Neurological Examination:  Mental Status: Patient is currently awake, alert, and oriented to person, place, and time.????Able to state the reason for today's visit and can recall events leading up to this visit.????Speech is fluent without any evidence of dysphasia.  ??  Cranial Nerves:  II Optic: Pupils equal and reactive.????Visual fields full.????III Oculomotor, IV Trochlear, VI Abducens:????Extraocular movements intact.????No nystagmus.????No gaze palsy or paresis.????No ptosis.  V Trigeminal: Facial sensation normal and symmetric in V1-V3 distribution.  VII Facial: Facial strength normal  and symmetric.  VIII Vestibulocochlear: Hearing intact bilaterally.  IX Glossopharyngeal / X Vagus: Palate elevates symmetrically and uvula midline.  XI Accessory: Shoulder shrug symmetric.??  XII Hypoglossal: Tongue midline with normal bilateral strength.  ??  Motor Examination: Full 5/5 strength in bilateral upper and lower extremities to confrontation..  ??  Sensory Examination: Sensation intact to light touch  ??  ??  Gait Examination: No difficulty standing from sitting position.????      Impression:   Diagnosis Orders   1. Migraine without aura and without status migrainosus, not intractable  CBC Auto Differential    Comprehensive Metabolic Panel   Migraines are currently under control with Topamax and Ubrelvy.    Plan:   Refilled medications.  Check routine labs.  Encouraged exercise.  Orders Placed This Encounter   Procedures   ??? CBC Auto Differential     Standing Status:   Future     Standing Expiration Date:   04/03/2020   ??? Comprehensive Metabolic Panel     Standing Status:   Future     Standing Expiration Date:   04/03/2020              Electronically signed by 04/05/2020, MD on 04/04/19 at 1:07 PM EDT

## 2019-04-06 ENCOUNTER — Encounter

## 2019-04-06 LAB — COMPREHENSIVE METABOLIC PANEL
ALT: 24 U/L (ref 0–34)
AST: 31 U/L (ref 15–46)
Albumin,Serum: 4.1 g/dL (ref 3.5–5.0)
Alkaline Phosphatase: 118 U/L (ref 38–126)
Anion Gap: 12 mmol/L (ref 3–13)
BUN: 15 mg/dL (ref 7–20)
CO2: 15 mmol/L — ABNORMAL LOW (ref 22–30)
Calcium: 8.9 mg/dL (ref 8.4–10.4)
Chloride: 109 mmol/L — ABNORMAL HIGH (ref 98–107)
Creatinine: 1.05 mg/dL (ref 0.52–1.25)
EGFR IF NonAfrican American: 67.6 mL/min (ref >60)
Glucose: 208 mg/dL — ABNORMAL HIGH (ref 70–100)
Potassium: 4.3 mmol/L (ref 3.5–5.1)
Sodium: 137 mmol/L (ref 135–145)
Total Bilirubin: 0.8 mg/dL (ref 0.2–1.3)
Total Protein: 7.3 g/dL (ref 6.3–8.2)
eGFR African American: 78.4 mL/min (ref >60)

## 2019-04-06 LAB — CBC WITH AUTO DIFFERENTIAL
Absolute Baso #: 0 10*3/uL (ref 0.0–0.2)
Absolute Eos #: 0 10*3/uL (ref 0.0–0.5)
Absolute Lymph #: 2.2 10*3/uL (ref 1.0–4.3)
Absolute Mono #: 0.4 10*3/uL (ref 0.0–0.8)
Absolute Neut #: 4.4 10*3/uL (ref 1.8–7.0)
Basophils: 0.5 % (ref 0.0–2.0)
Eosinophils: 0.3 % — ABNORMAL LOW (ref 1.0–6.0)
Granulocytes %: 62.8 % (ref 40.0–80.0)
Hematocrit: 38.5 % (ref 35.0–47.0)
Hemoglobin: 13 g/dL (ref 11.7–16.0)
Lymphocyte %: 30.9 % (ref 20.0–40.0)
MCH: 29 pg (ref 26.0–34.0)
MCHC: 33.7 % (ref 32.0–36.0)
MCV: 86.2 fL (ref 79.0–98.0)
MPV: 9.1 fL (ref 7.4–10.4)
Monocytes: 5.5 % (ref 2.0–10.0)
Platelets: 222 10*3/uL (ref 140–440)
RBC: 4.46 10*6/uL (ref 3.80–5.20)
RDW: 14.3 % (ref 11.5–14.5)
WBC: 7.1 10*3/uL (ref 3.6–10.7)

## 2019-04-27 MED ORDER — UBRELVY 50 MG PO TABS
50 | ORAL_TABLET | ORAL | 5 refills | Status: DC
Start: 2019-04-27 — End: 2019-08-04

## 2019-04-27 NOTE — Telephone Encounter (Signed)
I pended a script for patient's [name]. When you get a chance can you sign and send. Thank you

## 2019-07-15 NOTE — Telephone Encounter (Signed)
I wanted to let you know that I submitted the reauth for patients Ubrelvy. I will keep you updated on the status.

## 2019-08-04 ENCOUNTER — Telehealth: Admit: 2019-08-04 | Payer: PRIVATE HEALTH INSURANCE | Attending: Neurology | Primary: Family Medicine

## 2019-08-04 DIAGNOSIS — G43009 Migraine without aura, not intractable, without status migrainosus: Secondary | ICD-10-CM

## 2019-08-04 MED ORDER — UBRELVY 50 MG PO TABS
50 MG | ORAL_TABLET | ORAL | 3 refills | Status: DC
Start: 2019-08-04 — End: 2020-07-27

## 2019-08-04 MED ORDER — AMITRIPTYLINE HCL 25 MG PO TABS
25 MG | ORAL_TABLET | Freq: Every evening | ORAL | 2 refills | Status: DC
Start: 2019-08-04 — End: 2020-08-28

## 2019-08-16 NOTE — Telephone Encounter (Signed)
Medication:  [name] 50mg   Dosing Schedule: PRN    Prior Authorization:  Submitted date: 08/15/2019  PA reference #: 08/17/2019  Approval dates: 08/16/2019-01/14/2020    Total Copay: $ 0    Specialty Pharmacy: [facility]  Phone Number: [phone]

## 2019-11-03 NOTE — Telephone Encounter (Signed)
I called patient and scheduled an appointment.

## 2019-11-03 NOTE — Telephone Encounter (Signed)
11/03/2019     SUBJECTIVE     Sierra [name] is a 39 y.o. female who was referred to?[facility] for?clinical?management services?of?Ubrelvy 50 mg.     Principal Diagnosis is headache ICD10: R51.9      Adherence concerns: None at this time. Taking as directed.     Side Effects:  None reported.     Patient Concerns: She reports Sierra [name] is effective when needed. She also reports more frequent migraines and so follow-up scheduled with neurology for 11/16/19.     OBJECTIVE     Data:     Headache days per month: 20     Migraine days per month: 20     Planned activities missed per month (days): 0     Number of abortive medication used (#/month): 20      Current/Previous Treatments:     Preventatives: Amitriptyline, carvedilol   Tried: Topiramate    Abortives: Ubrelvy 50 mg   Tried: Sumatriptan   Nurtec contraindicated while on carvedilol      Allergies:     Allergies   Allergen Reactions   ??? Sumatriptan       Medication List:     Medication list including OTCs and herbals has been reviewed and reconciled in EMR. Drug /Drug interactions assessed.     0 issue(s) were identified.?     Ubrelvy appropriate at 50 mg dosing.     ASSESSMENT/PLAN     Efficacy:      The above medication is indicated and appropriate as prescribed for the above diagnosis.???     Pharmacy Recommendations include:      Continue headache medication as prescribed and follow up with provider and pharmacy as directed.?     Follow up:    [facility] will monitor adherence, side effects, efficacy and financial/insurance concerns. Appropriate data will be collected and evaluated for continued usage.    Education included:      Goals of therapy: Abort active migraines as they occur.     Monitoring and follow up: There are no labs to follow with Sierra [name], but the patient will be routinely monitored through electronic and in-person methods for the migraine response to the therapy.  It is essential that the patient remain in contact with the pharmacy  and provider to optimize their safety and care.     Adherence:?Taking as directed      Side effects/ADRs/DDIs: None reported      Contact:?Contact pharmacy or provider with questions or concerns.     Time Spent:?5 minutes?     Bradly Bienenstock, [facility]

## 2019-11-03 NOTE — Telephone Encounter (Signed)
Spoke to patient over telephone. She reports more frequent migraines and is wondering if she can be seen for a follow-up appointment. Last office visit was 08/04/19.     Would it be possible for the office to call her at [phone] for scheduling?    Thank you for your time!

## 2019-11-16 ENCOUNTER — Ambulatory Visit
Admit: 2019-11-16 | Discharge: 2019-11-16 | Payer: PRIVATE HEALTH INSURANCE | Attending: Neurology | Primary: Family Medicine

## 2019-11-16 DIAGNOSIS — R519 Headache, unspecified: Secondary | ICD-10-CM

## 2019-11-16 MED ORDER — EMGALITY 120 MG/ML SC SOAJ
120 | PEN_INJECTOR | SUBCUTANEOUS | 2 refills | Status: DC
Start: 2019-11-16 — End: 2019-11-25

## 2019-11-17 NOTE — Telephone Encounter (Addendum)
Medication:  Emgality 120mg /ml  Dosing Schedule:    Prior Authorization:  Submitted date: 11/17/2019  PA reference #: 11/19/2019 - CMM key  Approval dates:   Denied date: 11/16/2020  PA denial reason(s): Step therapy Pt needs to Try Aimovig and Ajovy first.       Please let 11/18/2020 know how you would like to proceed.

## 2019-11-22 MED ORDER — AIMOVIG 70 MG/ML SC SOAJ
70 | PEN_INJECTOR | SUBCUTANEOUS | 3 refills | Status: DC
Start: 2019-11-22 — End: 2019-11-25

## 2019-11-22 NOTE — Telephone Encounter (Addendum)
Medication:  Aimovig 70mg /ml  Dosing Schedule: once monthly     Prior Authorization:  Submitted date: 11/22/2019  PA reference #: 13/02/2019  Approval dates: 11/22/2019-05/21/2020    Total Copay: $ 0.00    Specialty Pharmacy: [facility]  Phone Number: [phone]      PS. I pended a script for review.

## 2019-11-23 NOTE — Telephone Encounter (Signed)
Sierra [name] has been approved by insurance. The Rx is pended for review at your convenience.     Thank you for your time!

## 2019-11-23 NOTE — Telephone Encounter (Addendum)
Medication:  Ajovy 225mg /1.23ml  Dosing Schedule: Monthly     Prior Authorization:  Submitted date: 11/23/2019  PA reference #: 13/03/2019  Approval dates: 11/23/2019-05/22/2020    Total Copay: $ 0.00    Specialty Pharmacy:  [facility]  Phone Number: [phone]

## 2019-11-23 NOTE — Telephone Encounter (Signed)
Aimovig was approved by plan, but noted she has a history of uncontrolled blood pressures. Aimovig does have reports of exacerbating HTN. Due to her significantly high blood pressures, wanted to make you aware and assess if Ajovy would be preferred since it does not have reports of increasing blood pressure. Unfortunately, Emgality is not on her insurance formulary.     Please advise how you prefer to proceed. Thank you for your time!

## 2019-11-24 MED ORDER — AJOVY 225 MG/1.5ML SC SOAJ
225 | SUBCUTANEOUS | 3 refills | Status: DC
Start: 2019-11-24 — End: 2020-08-28

## 2019-11-25 NOTE — Telephone Encounter (Signed)
11/25/2019     SUBJECTIVE     Sierra [name] is a 39 y.o. female who was referred to?[facility] for?clinical?management services?of?Ajovy 225mg /1.44mL.       Principal Diagnosis is headache ICD10: R51.9      Adherence: N/A - new to therapy    Side Effects: N/A - new to therapy    Patient Concerns: None at this time     OBJECTIVE     Data:     Headache days per month: 20     Migraine days per month: 20     Planned activities missed per month (days): 20     Number of abortive medication used (#/month): 10     Current/Previous Treatments:     Preventatives: Ajovy, amitriptyline, carvedilol   Tried: Topiramate       Abortives: Ubrelvy 50 mg  Tried: Sumatriptan    Nurtec contraindicated while on carvedilol      Allergies:      Allergies   Allergen Reactions   ??? Sumatriptan    ??   Medication List:     Medication list including OTCs and herbals has been reviewed and reconciled in EMR. Drug /Drug interactions assessed.     No issue(s) were identified with addition of Ajovy. ?     ASSESSMENT/PLAN     Efficacy:      The above medication remains indicated and appropriate as prescribed for the above diagnosis.???     Pharmacy Recommendations include:      Start headache medication as prescribed and follow up with provider and pharmacy as directed.?     Follow up:    [facility] will monitor adherence, side effects, efficacy and financial/insurance concerns. Appropriate data will be collected and evaluated for continued usage.     Education included:      Goals of therapy: Decrease the frequency and intensity of migraines as a preventative agent.     Monitoring and follow up: There are no labs to follow with Ajovy, but the patient will be routinely monitored through electronic and in-person methods for the migraine response to the therapy.  It is essential that the patient remain in contact with the pharmacy and provider to optimize their safety and care.     Disease?state education:?Counseled on general migraine  management strategies along with emphasizing that Ajovy is a chronic treatment to be routinely administered as long as it is safe, effective, and necessary for migraine control.     Expectations of Therapy: It is not designed to abort an active migraine attack, and unless instructed otherwise by the provider, the patient will continue all current preventative and abortive migraine agents.  When started, the full effects of Ajovy may take 2-3 doses to be observed.  The patient is to continue with the migraine preventative and abortive therapies already being used.  They are to check with their provider if a therapy is desired to be discontinued.          Adherence:?Since this is a preventative therapy, the patient needs to take Ajovy routinely every 30 days regardless of the activity of their migraines.     Side effects/ADRs/DDIs: Educated that common side effects include pain and bruising at injection site.  Otherwise, the therapy is generally well tolerated.     Storage/Administration:?Ajovy is administered as subcutaneous injections every 30 days by the patient. Counseled on administration directions and techniques. Tips for self-injection process:     -Remove dose from refrigerator 30-60 minutes prior to injection  and allow to warm to room temperature.??     -Disinfect the injection site with either supplied wipes or rubbing alcohol.     -Rotate injection sites - best sites for a subcutaneous injection is top of the thighs or?abdomen/abdominal region?at least 2 inches away from the belly button.  It is important to avoid injecting into the muscle or blood vessels.??     -May apply ice or heat the spot prior to and after injection.?     -Can treat with OTC agents for minor irritation including hydrocortisone or diphenhydramine.?     Contact:?Advised when to contact pharmacy or provider. Provided with direct number to clinical pharmacist for questions or concerns prior to scheduled follow up. The patient was  oriented to the pharmacy???s services upon their initial fill and it was communicated that they can participate in this plan of care by speaking?with a Pharmacist which is offered during each reassessment.          Time Spent:?15 minutes?     ?     Bradly Bienenstock, [facility]

## 2019-12-23 NOTE — Telephone Encounter (Signed)
Medication: [name] 50mg    Dosing Schedule: PRN    Prior Authorization:  Submitted date: 12/23/2019  PA reference #: 14/03/2019  Approval dates: 12/23/2019-06/22/2020    Total Copay: $ 0.00    Specialty Pharmacy: [facility]   Phone Number: [phone]

## 2020-01-25 ENCOUNTER — Encounter: Attending: Neurology | Primary: Family Medicine

## 2020-02-13 MED ORDER — UBRELVY 50 MG PO TABS
50 | ORAL_TABLET | ORAL | 3 refills | Status: DC
Start: 2020-02-13 — End: 2020-07-27

## 2020-02-13 NOTE — Telephone Encounter (Signed)
I pended rx for review, when you have a chance can you sign and send? Thank you

## 2020-04-06 MED ORDER — AJOVY 225 MG/1.5ML SC SOAJ
225 | SUBCUTANEOUS | 5 refills | Status: DC
Start: 2020-04-06 — End: 2020-07-27

## 2020-04-06 NOTE — Telephone Encounter (Signed)
Refills needed for Ajovy. Script pended for review.     Thank you!

## 2020-04-24 NOTE — Telephone Encounter (Addendum)
Medication:  Ajovy 225 mg autoinjector  Dosing Schedule: every 30 days     Prior Authorization:  Submitted date: 04/24/20  PA reference #: [id number]  Approved: 04/24/20 - 04/24/21    Copay: Izora Gala    [facility]  [phone]

## 2020-07-27 ENCOUNTER — Ambulatory Visit
Admit: 2020-07-27 | Discharge: 2020-07-27 | Payer: PRIVATE HEALTH INSURANCE | Primary: Student in an Organized Health Care Education/Training Program

## 2020-07-27 DIAGNOSIS — B372 Candidiasis of skin and nail: Secondary | ICD-10-CM

## 2020-07-27 MED ORDER — FLUCONAZOLE 150 MG PO TABS
150 MG | ORAL_TABLET | ORAL | 0 refills | Status: AC
Start: 2020-07-27 — End: 2020-08-02

## 2020-07-27 MED ORDER — DOXYCYCLINE HYCLATE 100 MG PO TABS
100 MG | ORAL_TABLET | Freq: Two times a day (BID) | ORAL | 0 refills | Status: AC
Start: 2020-07-27 — End: 2020-08-03

## 2020-07-27 MED ORDER — NYSTATIN 100000 UNIT/GM EX POWD
100000 UNIT/GM | CUTANEOUS | 0 refills | Status: DC
Start: 2020-07-27 — End: 2020-11-07

## 2020-07-27 NOTE — Progress Notes (Signed)
Subjective:     Patient: Sierra [name] is a 40 y.o. female       Patient presents to urgent care with chief complaint of concern for yeast infection to skin.  Symptoms have been present on and off for 2 months.  Erythematous rashes present to groin folds, folds of abdominal pannus, under breasts.yeast infection to skin has caused patient to have open areas with sores.  Rash is erythematous, moist, malodorous.  Has concern for bacterial infection of the sores.  Has not taken anything for symptom relief at this time.   Denies current fever, chills, nausea/vomiting, abdominal pain.     Review of Systems   Constitutional: Negative for activity change, appetite change, chills, fatigue and fever.   Respiratory: Negative for shortness of breath.    Cardiovascular: Negative for chest pain.   Gastrointestinal: Negative for nausea and vomiting.   Skin: Positive for color change, rash and wound.        Allergies   Allergen Reactions   ??? Latex Hives   ??? Fish-Derived Products    ??? Pineapple Other (See Comments)     Pineapple and flavoring  Pineapple and flavoring     ??? Raspberry Other (See Comments)     Raspberries and flavorings  Raspberries and flavorings     ??? Sumatriptan      Current Outpatient Medications on File Prior to Visit   Medication Sig Dispense Refill   ??? Fremanezumab-vfrm (AJOVY) 225 MG/1.5ML SOAJ Inject 225 mg into the skin every 30 days 1.5 mL 3   ??? amitriptyline (ELAVIL) 25 MG tablet Take 1 tablet by mouth nightly 1 tab at bedtime for 2 weeks then 2 tabs at bedtime 60 tablet 2   ??? Ubrogepant (UBRELVY) 50 MG TABS Take 50 mg by mouth as needed (migraine) (Patient taking differently: Take 50 mg by mouth as needed (migraine) 50 mg in am and 100mg  Pm) 6 tablet 5   ??? gabapentin (NEURONTIN) 300 MG capsule Take 300 mg by mouth 3 times daily.     ??? aspirin 81 MG chewable tablet Take 1 tablet by mouth daily 30 tablet 3   ??? atorvastatin (LIPITOR) 20 MG tablet Take 1 tablet by mouth nightly 30 tablet 3   ??? oxaprozin  (DAYPRO) 600 MG tablet Take 600 mg by mouth      ??? omeprazole (PRILOSEC) 40 MG delayed release capsule TAKE 1 CAPSULE BY MOUTH ONCE DAILY 30 MINUTES BEFORE MORNING MEAL     ??? lisinopril (PRINIVIL;ZESTRIL) 20 MG tablet Take 25 mg by mouth      ??? carvedilol (COREG) 25 MG tablet Take 25 mg by mouth 2 times daily      ??? amLODIPine (NORVASC) 10 MG tablet Take 10 mg by mouth     ??? tiZANidine (ZANAFLEX) 4 MG tablet Take 1 tablet by mouth every 8 hours as needed (breakthrough migraine) (Patient not taking: Reported on 07/27/2020) 12 tablet 2   ??? insulin 70-30 (HUMULIN;NOVOLIN) (70-30) 100 UNIT per ML injection vial Inject 90 Units into the skin 2 times daily (Patient not taking: Reported on 07/27/2020) 1 vial 3   ??? insulin lispro (HUMALOG) 100 UNIT/ML injection vial Inject 25 Units into the skin 3 times daily (with meals) (Patient not taking: Reported on 08/04/2019) 1 vial 3     No current facility-administered medications on file prior to visit.      Past Medical History:   Diagnosis Date   ??? Arthritis     In back   ???  Diabetes mellitus (HCC)    ??? Hypertension       Social History     Tobacco Use   ??? Smoking status: Former Smoker     Packs/day: 0.25     Years: 0.50     Pack years: 0.12     Types: Cigarettes     Quit date: 2018     Years since quitting: 4.5   ??? Smokeless tobacco: Never Used   Substance Use Topics   ??? Alcohol use: Not Currently          Objective:     BP (!) 189/113 (Site: Left Lower Arm, Position: Sitting, Cuff Size: Large Adult)    Pulse 85    Temp (!) 96.2 ??F (35.7 ??C) (Infrared)    Ht 5\' 6"  (1.676 m)    Wt (!) 356 lb (161.5 kg)    SpO2 97%    BMI 57.46 kg/m??     Physical Exam  Vitals reviewed.   Constitutional:       General: She is not in acute distress.     Appearance: She is obese.   Cardiovascular:      Rate and Rhythm: Normal rate.   Pulmonary:      Effort: Pulmonary effort is normal. No respiratory distress.      Comments: Nasal cannula O2 in place  Skin:     General: Skin is warm and dry.              Comments: Moist, erythematous, macular rash present to areas as depicted, including under breast, abdominal pannus, antecubital area  Area is malodorous, tender to palpation  Several open sores present to abdominal pannus area with surrounding erythema, swelling   Neurological:      Mental Status: She is alert and oriented to person, place, and time.         Assessment      1. Candidal dermatitis    2. Skin infection, bacterial         Plan      1. Candidal dermatitis  - fluconazole (DIFLUCAN) 150 MG tablet; Take 1 tablet by mouth every 72 hours for 6 days  Dispense: 2 tablet; Refill: 0  - nystatin (MYCOSTATIN) 100000 UNIT/GM powder; Apply 3 times daily.  Dispense: 60 g; Refill: 0    2. Skin infection, bacterial  - doxycycline hyclate (VIBRA-TABS) 100 MG tablet; Take 1 tablet by mouth 2 times daily for 7 days  Dispense: 14 tablet; Refill: 0    Will treat patient for widespread candidal dermatitis, with likely secondary bacterial infection to open areas of abdominal pannus.  Patient prescribed Diflucan, nystatin powder, doxycycline.  Patient to take these medications as prescribed until complete, even if symptoms improve or resolve while taking the medication.  Area should be kept clean and dry, may use towels/pillowcases and folds as needed to maintain dryness.  Follow-up with PCP for new, worsening, persistent symptoms.    , APRN - CNP  07/27/20  12:07 PM        If symptoms do not improve, worsen, or new symptoms develop, see PCP for further evaluation.

## 2020-07-27 NOTE — Patient Instructions (Signed)
Patient Education        Yeast Skin Infection: Care Instructions  Your Care Instructions     Yeast normally lives on your skin. Sometimes too much yeast can overgrow in certain areas of the skin and cause an infection. The infection causes red,scaly, moist patches on your skin that may itch.  Common areas for skin yeast infections are skin folds under the breasts or belly area. The warm and moist areas in the skin folds can make it easier for yeast to overgrow. Yeast infections also can be found on other parts of thebody such as the groin or armpits.  You will probably get a cream or ointment that contains an antifungal medicine. Examples of these are miconazole and clotrimazole. You put it on your skin to treat the infection. Your doctor may give you a prescription for the cream or ointment. Or you may be able to buy it without a prescription at mostdrugstores.  If the infection is severe, the doctor will prescribe antifungal pills.  A yeast infection usually goes away after about a week of treatment. But it'simportant to use the medicine for as long as your doctor tells you to.  Follow-up care is a key part of your treatment and safety. Be sure to make and go to all appointments, and call your doctor if you are having problems. It's also a good idea to know your test results and keep alist of the medicines you take.  How can you care for yourself at home?  ??? Be safe with medicines. Take your medicines exactly as prescribed. Call your doctor if you think you are having a problem with your medicine.  ??? Keep your skin clean and dry. Your doctor may suggest using powder that contains an antifungal medicine in the skin folds.  ??? Wear loose clothing.  When should you call for help?   Call your doctor now or seek immediate medical care if:  ?? ??? You have symptoms of infection, such as:  ? Increased pain, swelling, warmth, or redness.  ? Red streaks leading from the area.  ? Pus draining from the area.  ? A fever.    Watch closely for changes in your health, and be sure to contact your doctor if:  ?? ??? You do not get better as expected.   Where can you learn more?  Go to [email or web address] and sign in to your MyChart account. Enter A142 in the Search Health Information box to learn more about "Yeast Skin Infection: Care Instructions."     If you do not have an account, please click on the "Sign Up Now" link.  Current as of: December 05, 2019??????????????????????????????Content Version: 13.3  ?? 2006-2022 [employer].   Care instructions adapted under license by [facility]. If you have questions about a medical condition or this instruction, always ask your healthcare professional. [employer] disclaims any warranty or liability for your use of this information.

## 2020-08-06 MED ORDER — AJOVY 225 MG/1.5ML SC SOAJ
225 | PEN_INJECTOR | SUBCUTANEOUS | 3 refills | Status: DC
Start: 2020-08-06 — End: 2020-08-28

## 2020-08-06 NOTE — Telephone Encounter (Signed)
I pended script for review. Patient was last seen on 11/16/19. If appropriate can you sign and send? Thank you

## 2020-08-20 NOTE — Telephone Encounter (Signed)
Patient called today stating that the Ubrelvy and the injection are no longer helping with her headaches and she is requesting a higher dose of the Ubrelvy or to try something else altogether. Please call the patient at [phone]

## 2020-08-21 NOTE — Telephone Encounter (Signed)
Spoke to pt and scheduled an appt. No further questions.

## 2020-08-28 ENCOUNTER — Ambulatory Visit
Admit: 2020-08-28 | Discharge: 2020-08-28 | Payer: PRIVATE HEALTH INSURANCE | Attending: Physician Assistant | Primary: Student in an Organized Health Care Education/Training Program

## 2020-08-28 DIAGNOSIS — G43019 Migraine without aura, intractable, without status migrainosus: Secondary | ICD-10-CM

## 2020-08-28 MED ORDER — EMGALITY 120 MG/ML SC SOAJ
120 MG/ML | PEN_INJECTOR | Freq: Once | SUBCUTANEOUS | 0 refills | Status: DC
Start: 2020-08-28 — End: 2020-08-28

## 2020-08-28 MED ORDER — UBRELVY 50 MG PO TABS
50 | ORAL_TABLET | ORAL | 5 refills | Status: AC | PRN
Start: 2020-08-28 — End: ?

## 2020-08-28 MED ORDER — TOPIRAMATE 100 MG PO TABS
100 MG | ORAL_TABLET | Freq: Two times a day (BID) | ORAL | 5 refills | Status: AC
Start: 2020-08-28 — End: ?

## 2020-08-28 MED ORDER — METHYLPREDNISOLONE 4 MG PO TBPK
4 MG | PACK | ORAL | 0 refills | Status: AC
Start: 2020-08-28 — End: 2020-09-03

## 2020-08-28 MED ORDER — EMGALITY 120 MG/ML SC SOAJ
120 MG/ML | PEN_INJECTOR | Freq: Once | SUBCUTANEOUS | 0 refills | Status: AC
Start: 2020-08-28 — End: 2020-08-28

## 2020-08-28 NOTE — Telephone Encounter (Signed)
Jillyn Hidden at [facility] pharmacy LM on VM requesting clarification on medrol dose pack  sig. They need to know how many to take by mouth.

## 2020-08-28 NOTE — Progress Notes (Signed)
[place]  [phone] [id number]  Dept: [phone]  Dept Fax: [phone]  [place]: [phone]     Visit type: Established patient    Reason for Visit: Follow-up (F/u medication)      Assessment and Plan       1. Intractable migraine without aura and without status migrainosus  Assessment & Plan:  Patient's headaches have significantly worsened since last OV.     They are daily in nature at this time.     Patient reports that the Ajovy is no longer being beneficial. Her last dose was about 08/15/2020. I am going to discontinue the Ajovy in favor of her trying the Emgality 120 mg. She will take the loading dose (2 injections at 240 mg dose) on her next scheduled injection time, and then follow with one injection monthly there after.     Patient will continue with the Ubrelvy 50 mg as needed for onset of headache. If headache continues may repeat dose 2 hours later with max dose of 2 tablets in 24 hours. She receives 20 tablets per month to be used as needed.     Patient seems to be taking Topamax/topiramate 50 mg in the morning and 100 mg at bedtime. I am going to increase this dose to 100 mg every 12 hours. A new prescription will be sent to her pharmacy. Discussed side effects of numbness/tingling to fingers and toes (if that happens drink orange juice with the medication), can cause brain fog, kidney stones, weight loss, metallic taste, carbonated beverages will taste flat.     I will provide patient with a medrol dose pack to try to break the cycle of this headache. The first day she will take 6 tables, then decrease by one daily until pack finished. Discussed side effects with difficulties with sleep, increased anxiety, heart palpitations, etc. She does have diabetes and reports that her blood sugars have been good. Discussed with patient that her blood sugar may elevate while on this medication and to watch closely.     May consider Botox at  next office visit if headaches are not improved.     I am going to discontinue the amitriptyline at this time. Patient is not entirely sure of the medications that she is taking at this time. She needs to make a list to keep with her. Amitriptyline can cause weight gain and we need to work on weight loss.     Patient needs to stay well hydrated with at least 64 ounces of water daily.   Daily exercise  8 hours sleep per night.   Orders:  -     topiramate (TOPAMAX) 100 MG tablet; Take 1 tablet by mouth in the morning and 1 tablet in the evening., Disp-60 tablet, R-5This replaces the last prescription and patient is to stop the amitriptylineNormal  -     Ubrogepant (UBRELVY) 50 MG TABS; Take 50 mg by mouth as needed (headache), Disp-20 tablet, R-530 day supplyNormal  -     [facility]  -     methylPREDNISolone (MEDROL DOSEPACK) 4 MG tablet; Take by mouth., Disp-1 kit, R-0Normal  -     EMGALITY 120 MG/ML SOAJ; Inject 240 mg into the skin once for 1 dose, Disp-1 pen, R-0, DAWSwitching from Ajovy to [employer]. Loading dose. Last dose of Ajovy ~08/15/2020 please confirm with shipping date. Emgality to be taken at next scheduled  injection time. TYNormal  2. Uncontrolled hypertension  Assessment & Plan:  Patient's blood pressure is uncontrolled.     This could be a contributing factor to poorly controlled headaches.   It also puts her at risk of having strokes.     Patient's blood pressure was 191/127.     Patient needs to work on weight loss to ideal body weight.   She needs to consider the DASH diet to help lower blood pressure.   She needs to follow up with PCP to try to adequately control blood pressure.     Patient may benefit from weight loss surgery. May consider a nutritionist referral to help with weight loss. I will refer patient to the bariatric program for more assistance. She states that she has a skin infection on her abdomen that may be limiting the  ability for surgery.   Orders:  -     [facility]  3. Morbid obesity with body mass index (BMI) of 60.0 to 69.9 in adult [facility])  Assessment & Plan:  I will refer patient to bariatric program to help with weight loss options.   Orders:  -     [facility]  4. Obstructive sleep apnea syndrome  Assessment & Plan:  Patient has symptoms that may be consistent with sleep apnea.     Patient states that she has never been tested for sleep apnea, but states that she was previously treated with an autopap due suspected sleep apnea.     She is no longer using that device at this time.     With uncontrolled blood pressure and uncontrolled headaches, not treating the sleep apnea could be a contributing factor. I will refer patient to sleep specialist for further evaluation and treatment.   Orders:  -     [facility]    Active Problems:    * No active hospital problems. *  Resolved Problems:    * No resolved hospital problems. *       Return in about 3 months (around 11/28/2020) for with Myriam Jacobson .    Subjective       HPI   Patient is a return visit to the neurology clinic.  Patient was last seen 11/16/2019 by Dr. Glenice Laine concerning chronic migraines.  At that time she was to continue with amitriptyline at bedtime, and add Emgality for headache prevention.  She was to limit her alcohol to less than 10 times per month and use Ubrelvy as needed for onset of headache.    Headache history:  Onset: ~26 worsening at age 40  Family history: No known family history significant for headaches.  Risk factors: Possibly untreated sleep apnea, uncontrolled blood pressure  Location: Holocephalic  Description: Throbbing.  Associated with photophobia, phonophobia, lightheadedness.  Frequency/duration: Daily in all day long  for the last 3 months  Triggers: Weather changes, stress  Relievers: Nothing at this time  Tried abortive medication: Over-the-counter pain relievers such as Advil, Motrin, Aleve, Tylenol.  Imitrex (caused chest pain and she was admitted with a hypertensive emergency), Zanaflex, Melburn Hake prevention medication: Topamax (initially caused sedation when she was 100 mg twice daily), amitriptyline, gabapentin, carvedilol, Ajovy  Current Treatment with medication: Ajovy 225 mg monthly injection, Ubrelvy 50 mg as needed for onset, Topamax 50  mg in the morning and 100 mg at bedtime  Sleep: Patient has difficulty with sleep.  She admits to waking with her headaches.  She states that she is trying to be tested for sleep apnea and is currently wearing oxygen.  She states previously she was treated with autoPAP for suspected sleep apnea but she did not tolerate it.  She admits to occasional snoring arousals when she is congested.  She is not refreshed upon waking.  She has low energy throughout the entire day.  Missed time from family function/work: None  ER visits since last OV: None  Work-up:  Neuroimaging:  MRI brain with and without contrast 09/25/2018: No acute infarction.  Scattered foci of T2/FLAIR hyperintense signal within the cerebral white matter, which are nonspecific and can be seen with demyelinating disease, migraine syndrome or vasculitis.  Microangiopathy is less common in this age group.  MRA brain and neck 09/25/2018: No hemodynamically significant narrowing of the major arteries of the circle of Willis or posterior circulation.  No hemodynamically significant narrowing of the major arteries of the neck.    At this time patient reports that her headaches have significantly worsened.  They were daily for the last 3 months lasting all day long.  She is going to bed and waking with her headaches.  She is currently rating her headache at a 7 out of 10 on the severity scale.  Patient is not entirely sure what  medication she is actually taking.  She initially it was that she was taking Ubrelvy 50 mg in the morning and 100 mg at bedtime.  She then stated she was taking her amitriptyline like this and in the next sentence that she has been on the amitriptyline for more than a month and a half.  On review of her medications the only medication that could be taking in this regard would be the topiramate.    Patient is morbidly obese.  She is wearing oxygen at this time.  We discussed about increasing the topiramate 200 mg twice daily and discontinue the amitriptyline.  Amitriptyline caused weight gain which is something we do not want in her condition.  We discussed changing the Ajovy to Emgality due to lack of benefit with this medication.  I do not recommend Aimovig at this time due to risk of increasing her blood pressure which is currently uncontrolled.  Discussed with patient states that her finally she has taken the [place] so she does not want her medications.  We also discussed trying a Medrol Dosepak to break the cycle of this headache that she is currently having.  Discussed consideration for Botox also.      REVIEW OF SYSTEMS:   Review of Systems  Patient denies vision changes, tinnitus, and dizziness. No difficulties with chewing, swallowing or speech.  She does have shortness of breath and dyspnea on exertion, but currently denies any chest pain or heart palpitations. No nausea, vomiting, diarrhea or constipation. No focal areas of weakness. No paresthesia. No gait instability. Ambulatory with no assisted devices.  She does tilt her oxygen tank behind her.  Denies any depression or anxiety. No memory impairments, but is noted to have difficulties recalling her exact medications.   Other ROS as per HPI.       Allergies   Allergen Reactions    Latex Hives    Fish-Derived Products     Pineapple Other (See Comments)     Pineapple and flavoring  Pineapple and flavoring  Raspberry Other (See Comments)      Raspberries and flavorings  Raspberries and flavorings      Sumatriptan        Outpatient Medications Prior to Visit   Medication Sig Dispense Refill    nystatin (MYCOSTATIN) 100000 UNIT/GM powder Apply 3 times daily. 60 g 0    gabapentin (NEURONTIN) 300 MG capsule Take 300 mg by mouth 3 times daily.      aspirin 81 MG chewable tablet Take 1 tablet by mouth daily 30 tablet 3    insulin 70-30 (HUMULIN;NOVOLIN) (70-30) 100 UNIT per ML injection vial Inject 90 Units into the skin 2 times daily 1 vial 3    insulin lispro (HUMALOG) 100 UNIT/ML injection vial Inject 25 Units into the skin 3 times daily (with meals) 1 vial 3    atorvastatin (LIPITOR) 20 MG tablet Take 1 tablet by mouth nightly 30 tablet 3    oxaprozin (DAYPRO) 600 MG tablet Take 600 mg by mouth       omeprazole (PRILOSEC) 40 MG delayed release capsule TAKE 1 CAPSULE BY MOUTH ONCE DAILY 30 MINUTES BEFORE MORNING MEAL      lisinopril (PRINIVIL;ZESTRIL) 20 MG tablet Take 25 mg by mouth       carvedilol (COREG) 25 MG tablet Take 25 mg by mouth 2 times daily       amLODIPine (NORVASC) 10 MG tablet Take 10 mg by mouth      Fremanezumab-vfrm (AJOVY) 225 MG/1.5ML SOAJ Inject into the skin once every 30 days 1 pen 3    Fremanezumab-vfrm (AJOVY) 225 MG/1.5ML SOAJ Inject 225 mg into the skin every 30 days 1.5 mL 3    amitriptyline (ELAVIL) 25 MG tablet Take 1 tablet by mouth nightly 1 tab at bedtime for 2 weeks then 2 tabs at bedtime 60 tablet 2    Ubrogepant (UBRELVY) 50 MG TABS Take 50 mg by mouth as needed (migraine) (Patient taking differently: Take 50 mg by mouth as needed (migraine) 50 mg in am and 170m Pm) 6 tablet 5    tiZANidine (ZANAFLEX) 4 MG tablet Take 1 tablet by mouth every 8 hours as needed (breakthrough migraine) (Patient not taking: No sig reported) 12 tablet 2     No facility-administered medications prior to visit.        Past Medical History:   Diagnosis Date    Arthritis     In back    Diabetes [place]     Hypertension         Social  History     Tobacco Use    Smoking status: Former     Packs/day: 0.25     Years: 0.50     Pack years: 0.13     Types: Cigarettes     Quit date: 2018     Years since quitting: 4.6    Smokeless tobacco: Never   Substance Use Topics    Alcohol use: Not Currently        Past Surgical History:   Procedure Laterality Date    CESAREAN SECTION      CHOLECYSTECTOMY         Family History   Problem Relation Age of Onset    Diabetes Mother     High Blood Pressure Mother     High Blood Pressure Father     Diabetes Father        Objective       Vitals:  BP (!) 175/132 (Site: Left Lower Arm, Position:  Sitting, Cuff Size: Medium Adult)    Pulse (!) 103    Temp 97.2 ??F (36.2 ??C) (Infrared)    Ht '5\' 6"'  (1.676 m)    Wt (!) 345 lb 12.8 oz (156.9 kg)    BMI 55.81 kg/m??     General: The patient was well developed, in no acute distress.  HEENT: Normocephalic, atraumatic. Conjunctiva, lids, pupils, and irises clear. Oral mucosa is moist.  Neck: Supple.  No carotid bruits.  Full range of motion.  No nuchal rigidity or neck tenderness to palpation.  Cardiovascular: Regular rate and rhythm.  No murmurs.  Arms and legs are warm and well-perfused.  No clubbing, cyanosis or edema.  Lungs: Clear to auscultation.  Abdomen: Soft, non-tender, non-distended.  Positive bowel sounds.  Skin (Restricted to face and distal upper and lower extremities): No bruising noted, no lacerations noted.   Musculoskeletal: No tenderness observed.     Neurological Examination:  Mental Status: Patient is currently awake, alert, and oriented to person, place, and time.  Able to state the reason for today's visit and can recall events leading up to this visit.  Speech is fluent without any evidence of dysphasia.     Cranial Nerves:  II Optic: Pupils equal and reactive.  Visual fields full.    III Oculomotor, IV Trochlear, VI Abducens:  Extraocular movements intact.  No nystagmus.  No gaze palsy or paresis.  No ptosis.  V Trigeminal: Facial sensation normal and symmetric  in V1-V3 distribution.  VII Facial: Facial strength normal and symmetric.  VIII Vestibulocochlear: Hearing intact bilaterally.  IX Glossopharyngeal / X Vagus: Palate elevates symmetrically and uvula midline.  XI Accessory: Shoulder shrug symmetric.   XII Hypoglossal: Tongue midline with normal bilateral strength.     Motor Examination: Full 5/5 strength in bilateral upper and lower extremities to confrontation.  Normal muscle bulk.  No obvious atrophy or fasciculations seen.  No pronator drift.  Normal tone.  No rigidity.  No tremor.     Sensory Examination: Sensation intact to light touch, pinprick, temperature, and vibration bilaterally throughout.  No extinction to double simultaneous stimulation.     Reflexes: 1+ symmetric reflexes in biceps, triceps, brachioradialis, patella, and unable to elicit Achilles bilaterally. No clonus.     Cerebellar Examination: There is no overt dysmetria nor dysdiadochokinesia with finger-to-nose or rapid alternating movements.     Gait Examination: [place] to go from a sit to stand position.  Waddling gait and pulling an oxygen tank behind her.      I spent 50 minutes caring for this patient today, reviewing labs, records from another facility, seeing the patient, documenting in the record and arranging for studies.    Electronically signed by Philis Nettle, PA-C on 08/28/20 at 5:13 PM EDT

## 2020-08-28 NOTE — Assessment & Plan Note (Signed)
I will refer patient to bariatric program to help with weight loss options.

## 2020-08-28 NOTE — Assessment & Plan Note (Addendum)
Patient's blood pressure is uncontrolled.     This could be a contributing factor to poorly controlled headaches.   It also puts her at risk of having strokes.     Patient's blood pressure was 191/127.     Patient needs to work on weight loss to ideal body weight.   She needs to consider the DASH diet to help lower blood pressure.   She needs to follow up with PCP to try to adequately control blood pressure.     Patient may benefit from weight loss surgery. May consider a nutritionist referral to help with weight loss. I will refer patient to the bariatric program for more assistance. She states that she has a skin infection on her abdomen that may be limiting the ability for surgery.

## 2020-08-28 NOTE — Assessment & Plan Note (Signed)
Patient has symptoms that may be consistent with sleep apnea.     Patient states that she has never been tested for sleep apnea, but states that she was previously treated with an autopap due suspected sleep apnea.     She is no longer using that device at this time.     With uncontrolled blood pressure and uncontrolled headaches, not treating the sleep apnea could be a contributing factor. I will refer patient to sleep specialist for further evaluation and treatment.

## 2020-08-28 NOTE — Assessment & Plan Note (Addendum)
Patient's headaches have significantly worsened since last OV.     They are daily in nature at this time.     Patient reports that the Ajovy is no longer being beneficial. Her last dose was about 08/15/2020. I am going to discontinue the Ajovy in favor of her trying the Emgality 120 mg. She will take the loading dose (2 injections at 240 mg dose) on her next scheduled injection time, and then follow with one injection monthly there after.     Patient will continue with the Ubrelvy 50 mg as needed for onset of headache. If headache continues may repeat dose 2 hours later with max dose of 2 tablets in 24 hours. She receives 20 tablets per month to be used as needed.     Patient seems to be taking Topamax/topiramate 50 mg in the morning and 100 mg at bedtime. I am going to increase this dose to 100 mg every 12 hours. A new prescription will be sent to her pharmacy. Discussed side effects of numbness/tingling to fingers and toes (if that happens drink orange juice with the medication), can cause brain fog, kidney stones, weight loss, metallic taste, carbonated beverages will taste flat.     I will provide patient with a medrol dose pack to try to break the cycle of this headache. The first day she will take 6 tables, then decrease by one daily until pack finished. Discussed side effects with difficulties with sleep, increased anxiety, heart palpitations, etc. She does have diabetes and reports that her blood sugars have been good. Discussed with patient that her blood sugar may elevate while on this medication and to watch closely.     May consider Botox at next office visit if headaches are not improved.     I am going to discontinue the amitriptyline at this time. Patient is not entirely sure of the medications that she is taking at this time. She needs to make a list to keep with her. Amitriptyline can cause weight gain and we need to work on weight loss.     Patient needs to stay well hydrated with at least 64 ounces  of water daily.   Daily exercise  8 hours sleep per night.

## 2020-08-28 NOTE — Patient Instructions (Addendum)
Patient's headaches have significantly worsened since last OV.     They are daily in nature at this time.     Patient reports that the Ajovy is no longer being beneficial. Her last dose was about 08/15/2020. I am going to discontinue the Ajovy in favor of her trying the Emgality 120 mg. She will take the loading dose (2 injections at 240 mg dose) on her next scheduled injection time, and then follow with one injection monthly there after.     Patient will continue with the Ubrelvy 50 mg as needed for onset of headache. If headache continues may repeat dose 2 hours later with max dose of 2 tablets in 24 hours. She receives 20 tablets per month to be used as needed.     Patient seems to be taking Topamax/topiramate 50 mg in the morning and 100 mg at bedtime. I am going to increase this dose to 100 mg every 12 hours. A new prescription will be sent to her pharmacy. Discussed side effects of numbness/tingling to fingers and toes (if that happens drink orange juice with the medication), can cause brain fog, kidney stones, weight loss, metallic taste, carbonated beverages will taste flat.     I will provide patient with a medrol dose pack to try to break the cycle of this headache. The first day she will take 6 tables, then decrease by one daily until pack finished. Discussed side effects with difficulties with sleep, increased anxiety, heart palpitations, etc. She does have diabetes and reports that her blood sugars have been good. Discussed with patient that her blood sugar may elevate while on this medication and to watch closely.     May consider Botox at next office visit if headaches are not improved.     I am going to discontinue the amitriptyline at this time. Patient is not entirely sure of the medications that she is taking at this time. She needs to make a list to keep with her. Amitriptyline can cause weight gain and we need to work on weight loss.     Patient needs to stay well hydrated with at least 64 ounces  of water daily.   Daily exercise  8 hours sleep per night.     General Headache Instructions:    Maintain a headache diary; learn to identify and avoid triggers. There are simple apps on your phone you can download for headache and or migraine trackers.     Limit use of acute treatments (over-the-counter medications, triptans, etc.) to no more than 2 days per week or 10 days per month to prevent medication overuse headache (rebound headache). Over medicated headaches or rebound headaches can be very difficult to manage and treat.     Follow a regular sleep schedule (including weekends and holidays). Goal is for 8 hours per sleep cycle.    Don't skip meals.  Stay well hydrated with at least 64 ounces of water daily.     Avoid the following common headache triggers:  -Caffeine (coffee, chocolate, tea, cola/pop/soda (7-up, Sprite, [employer], Ginger Ale, Mug/A+W Root Beer, [place], Slice, Crush orange soda are okay due to being caffeine free))  -Foods containing nitrates (deli meat, ham, bacon, sausage, hot dogs)  -Tyramine (aged cheese; can only have American cheese, cottage cheese, Velveeta and fresh mozarella (most pizza uses aged mozarella))  -MSG (Chinese/Hispanic foods, Doritos, all flavored chips and Ramen noodles)  -Nutrasweet and artificial sweeteners  -Minimize stress.    Exercise 30 minutes per  day. Recommend 150 minutes per week of moderate intensity aerobic activity, and 60 minutes per week of muscle strenghtening activity. Being overweight is associated with a 5 times increased risk of chronic migraine.    Initiate non-pharmacologic measures at the earliest onset of your headache.        Rest and quiet in a cool, dark environment.       Relax and reduce stress.       Cold compress to head (place a dry washcloth to forehead, cover with a blue freezer packet and use a headband to press the freezer packet across the forehead and temples).       Can try ointments like bengay, biofreeze, amish  ointment and apply to the temporal/forehead area, behind the ears, and down the neck into th shoulder area.     Don't wait to take your abortive medication!! Take the maximum allowable dosage of prescribed medication at the very earliest sign of headache. If you wait until the headache severity increases, the rescue medications are less effective.   Be Compliant with your medication: Take prescribed medication regularly as directed and at the first sign of a headache.  Communicate: Call your physician when problems arise, especially if your headaches change, increase in frequency/severity, or become associated with neurological symptoms (weakness, numbness, slurred speech, etc.).    Headache/pain management therapies: Consider various complementary methods, including medication, behavioral therapy, psychological counselling, biofeedback, massage therapy, acupuncture, and other modalities. Such measures may reduce the need for medications. Counseling for pain management, where patients learn to function and ignore/minimize their pain, seems to work very well.    Recommend changing family's attention and focus away from patient's headaches. Instead, emphasize daily activities. If first question of day is 'How are your headaches/Do you have a headache today?', then patient will constantly think about headaches, thus making them worse. Goal is to re-direct attention away from headaches, toward daily activities and other distractions.        Avoiding Medication Overuse Headache (Rebound Headache):  Based on current research, the types of medications and their frequency of use which converts a previously episodic headache (particularly migraine) into a chronic daily headache (any headache occurring 15 or more days per month for at least 4 hours per day) are as follows:   ---> Over the counter medications, NSAIDS and combination analgesics:   -Using more than 2 days per week, or more than 10 days per month.  -These include  medications such as Acetaminophen (Tylenol), Naproxen (Aleve), Ibuprofen (Advil, Motrin), Acetaminophen/Caffeine (Excedrin), Acetaminophen/Dichloralphenazone/Isometheptene (Midrin), Aspirin (ok to continue if taking for medical reasons), cold remedies and sleep-promoting agents, among others.  ---> Triptans:   -Using more than 2 days per week, or more than 10 days per month.   -These include Sumatriptan (Imitrex), Sumatriptan/Naproxen (Treximet), Rizatriptan (Maxalt), Almotriptan (Axert), Zolmitriptan (Zomig), Eletriptan (Relpax), Naratriptan (Amerge), Frovatriptan (Frova).  ---> Opiates/Opioids (Narcotics):  -Not recommended for use of headache treatment.   -8 days or more per month. Some research suggests that even infrequent use of these medications makes migraine specific medications such as triptans and NSAIDs less effective.  -These include any narcotics such as Acetaminophen/Hydrocodone (Vicodin), Acetaminophen/Oxycodone (Percocet), Acetaminophen/Propoxyhene (Darvocet), Acetaminophen/Codeine (Tylenol #3, #4), Tramadol (Ultram), Acetaminophen/Tramadol (Ultracet), Oxycodone (OxyContin), Hydromorphone (Dilaudid), Fentanyl, Butorphanol (Stadol), Morphine or any form of a Morphine derivative.  ---> Butalbital containing medications:   -AVOID use with this medication unless last resort.   -5 or more days per month. As you can see, these are the worst  offenders.  -These include Acetaminophen/Butalbital/Caffeine (Fioricet, Esgic) Acetaminophen/Butalbital/Caffeine/Codeine (Fioricet with Codeine), Aspirin/Butalbital/Caffeine (Fiorinal), Aspirin/Butalbital/Caffeine/Codeine (Fiorinal with Codeine).        Vitamins and herbs that show potential for migraine prevention:  Magnesium: Magnesium (200 mg twice a day or 400 mg at bed) has a relaxant effect on smooth muscles such as blood vessels. We often give intravenous magnesium to patients who come into the emergency department for migraine because it helps to break the  migraine. Studies have found 40-90% average headache reduction when used as a preventative. Magnesium also demonstrated the benefit in menstrually related migraine. Magnesium is part of the messenger system in the serotonin cascade and it is a good muscle relaxant. It is also useful for constipation which can be a side effect of other medications used to treat migraine. Good sources include nuts, whole grains, and tomatoes. Magnesium may cause loose stool or diarrhea. If this happens you can take it every other day.     Riboflavin (Vitamin B2): 200 mg twice a day (or 400 mg daily). This vitamin assists nerve cells in the production of ATP, a principal energy storing molecule. It is necessary for many chemical reactions in the body. There have been at least 3 clinical trials of riboflavin using 400 mg per day all of which suggested that migraine frequency can be decreased. All 3 trials showed significant improvement in over half of migraine sufferers. The supplement is found in bread, cereal, milk, meat, and poultry. Most Americans get more riboflavin than the recommended daily allowance; however, riboflavin deficiency is not necessary for the supplements to help prevent headache.      Resources:   Https://www.ihe.ca/download/ambassador_medication_overuse_headache.pdf  [email or web address]  LifeSeats.no.pdf      Patient's blood pressure is uncontrolled.     This could be a contributing factor to poorly controlled headaches.   It also puts her at risk of having strokes.     Patient's blood pressure was 191/127.     Patient needs to work on weight loss to ideal body weight.   She needs to consider the DASH diet to help lower blood pressure.   She needs to follow up with PCP to try to adequately control blood pressure.     Patient has symptoms that may be consistent with sleep apnea.     Patient states that she has never been  tested for sleep apnea, but states that she was previously treated with an autopap due suspected sleep apnea.     She is no longer using that device at this time.     With uncontrolled blood pressure and uncontrolled headaches, not treating the sleep apnea could be a contributing factor. I will refer patient to sleep specialist for further evaluation and treatment.

## 2020-08-29 MED ORDER — EMGALITY 120 MG/ML SC SOAJ
120 | SUBCUTANEOUS | 3 refills | Status: AC
Start: 2020-08-29 — End: ?

## 2020-08-29 MED ORDER — EMGALITY 120 MG/ML SC SOAJ
120 | SUBCUTANEOUS | 0 refills | Status: DC
Start: 2020-08-29 — End: 2020-11-07

## 2020-08-29 NOTE — Telephone Encounter (Addendum)
Medication:  Emgality 120 mg/mL pen  Dosing Schedule: 240 mg once, then 120 mg every 30 days    Prior Authorization:  Submitted date: 08/29/20   PA reference #: [id number]  Approval: 08/29/20 - 02/28/21    Script received was for #1 pen. For loading dose #2 pens needed so pended updated script along with maintenance dose script if appropriate.     Thank you!

## 2020-09-03 NOTE — Telephone Encounter (Signed)
SUBJECTIVE    Sierra Sierra is a 40 year old Female who was referred to [facility] Sierra clinical management services Sierra Sierra 50 MG and Emgality 120 MG/ML.    Diagnosis Headache R51  Diagnosis Migraine without aura, intractable, without status migrainosus G43.019    OBJECTIVE    DC Emgality 09/03/2020  Headache Burden on ADLs  Assessment Date: 09/03/2020  Value: 0    Headache  Assessment Date: 09/03/2020  Value: 30    Migraine  Assessment Date: 09/03/2020  Value: 30    Acute therapy use Sierra headache  Assessment Date: 09/03/2020  Value: 30    DC Sierra 09/03/2020  Headache therapy determined effective  Assessment Date: 09/03/2020  Value: yes    ASSESSMENT / PLAN    Emgality 120 MG/ML    Expectations and Goals of therapy    The purpose of the medication(s) is to reduce the burden of migraines.    Disease state education    Reviewed that migraines are a dynamic and often chronic disease state. Reviewed mitigation strategies.    Administration    Emgality is administered as a subcutaneous injection every 4 weeks by the patient. Since this is a preventative therapy, the patient needs to take Sierra Sierra routinely regardless of the activity of their migraines. The patient is to continue with the migraine preventative and abortive therapies already being used. They are to check with their provider if a therapy is desired to be discontinued.Counseled on administration directions and techniques. Tips Sierra self-injection process:    Remove from fridge 30 minutes before administration.    Disinfect the injection site with either supplied wipes or rubbing alcohol.    Rotate injection sites - best sites Sierra a subcutaneous injection is top of the thighs or abdominal region at least 2 inches away from the belly button.     Sierra Sierra, Sierra Sierra. Sierra barriers. Sierra Sierra and so comfortable  with injection technique. Sierra barriers.     Storage/Disposal    Emgality is best when stored in the refrigerator in the original packaging. It is stable at room temperature in original carton Sierra 7 Sierra. After use, dispose of Emgality in a sharps container.    Side effects    Educated that common side effects include pain and bruising at injection site. Otherwise, the therapy is generally well tolerated.    Contact    Advised when to contact pharmacy or provider. Provided with direct number to clinical pharmacist Sierra questions or Sierra prior to scheduled follow up. The patient was oriented to the pharmacy???s services upon their initial fill and it was communicated that they can participate in this plan of care by speaking with a Pharmacist which is offered during each reassessment.    Adherence    Counseled on the importance of taking the migraine medication(s) as prescribed.    Monitoring and follow up    The patient will be routinely monitored through electronic and in-person methods Sierra the migraine response to the therapy. It is essential that the patient remain in contact with the pharmacy and provider to optimize their safety and care.    Pharmacy Recommendations/Education/Other    Current/Previous Treatments:     Preventatives: Emgality, amitriptyline, carvedilol  Tried: Sierra Sierra, Topiramate     Aimovig not advised due to h/o high blood pressure    Abortives: Sierra 50 mg  Tried: Sumatriptan    Nurtec contraindicated while on carvedilol      Sierra Sierra reports headaches are uncontrolled and so appropriate to change medications. She will start Emgality 97m Sierra after last dose of Sierra Sierra. Sierra Sierra, Sierra Sierra.     Sierra 50 MG    Administration    Sierra Sierra is administered orally without regard to food when a migraine occurs. Sierra Sierra is meant to be taken on an as Sierra basis when headaches occur and not to be taken routinely. Unless instructed otherwise by the provider, the patient  will continue all current preventative and abortive headache agents. Counseled on administration directions and techniques.     The patient may take one dose, and if the headache is not fully aborted, may take a second dose 2 hours after the first. The patient is limited to 200mg /24 hours and may treat up to 8 headaches/month with the medication.    Sierra Sierra remains comfortable taking Sierra Sierra acute migraine treatment. Repeat doses must be separated by at least 2 hours and maximum 2 tablets per 24 hours. Sierra barriers.     Contact    Advised when to contact pharmacy or provider. Provided with direct number to clinical pharmacist Sierra questions or Sierra prior to scheduled follow up. The patient was oriented to the pharmacy???s services upon their initial fill and it was communicated that they can participate in this plan of care by speaking with a Pharmacist which is offered during each reassessment.    Disease state education    Reviewed that headaches are a dynamic and often chronic disease state. Reviewed mitigation strategies.    Monitoring and follow up    The patient will be routinely monitored through electronic and in-person methods Sierra the headache response to the therapy. It is essential that the patient remain in contact with the pharmacy and provider to optimize their safety and care.    Storage/Disposal    Counseled that proper storage of medication is at room temperature (68-19F) in the original packaging. Temperature excursions allowed between 59-86 F.    Expectations and Goals of therapy    The purpose of the medication(s) is to reduce the burden of headaches.    Adherence    Since this is an abortive therapy, the patient is to take 1-2 doses as Sierra with the start of a headache.    Side effects    There are Sierra major side effects to counsel on with Sierra as it is generally well tolerated. The patient is urged to reach out if they feel that they are experiencing an  adverse reaction to the medication.    Efficacy    Discussed with patient    Pharmacy Recommendations/Education/Other    Current/Previous Treatments:  Preventatives: Emgality, amitriptyline, carvedilol  Tried: Sierra Sierra, Topiramate       Abortives: Sierra 50 mg  Tried: Sumatriptan    Nurtec contraindicated while on carvedilol      Lelon Mast still as benefit from [place] as Sierra. Sierra Sierra at this time. Sierra dose of 50 mg due to interaction with amlodipine and carvedilol thus not appropriate to increase Sierra dose further.     Shanautica Forker. PharmD

## 2020-09-18 ENCOUNTER — Ambulatory Visit
Admit: 2020-09-18 | Discharge: 2020-09-18 | Payer: PRIVATE HEALTH INSURANCE | Attending: Surgery | Primary: Student in an Organized Health Care Education/Training Program

## 2020-09-18 DIAGNOSIS — Z6841 Body Mass Index (BMI) 40.0 and over, adult: Secondary | ICD-10-CM

## 2020-09-18 NOTE — Progress Notes (Signed)
[facility]  SURGICAL WEIGHT LOSS MANAGEMENT PROGRAM   PROGRESS NOTE - INITIAL CONSULTATION    Patient: Sierra [name]   Date of Birth: 1980/06/09  Service Date: 09/18/20         Patient is here today to discuss the possibility of weight loss surgery. This patient is alone for the evaluation today    she is interested in discussing  discussing options    Physician Supervised D/E: 3  Established Cardiologist: no  Established Pulmonary: yes - Posey Pronto APRN -- [facility]    Weight Metrics:  Date of Initial Consultation: Consult Date: 09/18/20    Initial Weight: Initial Weight: 349 lb 12.8 oz (158.7 kg)    Initial BMI: Initial BMI: 56.45    Ideal Body Weight: Ideal Body Weight: 137 lb (62.1 kg)    Excess Body Weight: 212.8 lbs    Waist Circumference: 63.5 in (add to Surgical Wt Management Flowsheet)  Neck Circumference: 18 in (add to Surgical Wt Management Flowsheet)    Diabetes   Do you currently have diabetes?                                                        yes,   Are you currently prescribed insulin?                                  No , pt states PCP will not fill any medications   Are you currently prescribed an oral medication for diabetes?           no     GERD (Gastroesophageal Reflux Disease)   Do you currently have GERD?                                                           yes,                                                         Do you get heartburn type symptoms more than twice per week?  yes,   Are you currently on a medication for GERD? (not TUMS)                no                  (examples: Prilosec/omeprazole, Zantac/ranitidine, etc.)             Hyperlipidemia (high cholesterol)   Do you currently have a diagnosis of high cholesterol?                yes,   Are you currently prescribed a medication for high cholesterol?               (examples Lipitor/Atorvastatin, Pravastatin, Zetia, Tricor, etc.)          no  Have you been diagnosed with high cholesterol but  chosen not  to take medication?                   no  Hypertension (high blood pressure)   Do you currently have a diagnosis of Hypertension?     yes,   Are you currently on a medication for Hypertension?    no   Have you been diagnosed with Hypertension but have chosen   not to take the medication?                    no     Sleep Apnea   Do you currently have Sleep Apnea?        no  Are you on a device (CPAP, BiPAP, etc) for Sleep Apnea?     no  3.   Do you use the CPAP, BiPAP devise/have chosen not to treat?   no    Falls Risk Assessment  Patient does not take medications which affect BP or mental status   Patient does not have newly prescribed or changed dosage of medications within past 30 days which affect BP or mental status  Patient has not fallen in the past 2 months  Patient does not demonstrate unsteady gait  Patient uses the following ambulatory assistive devices: none  Patient states the presence of the following traits which increases risk of fall:    NA          Patient is low risk for falls. If high or moderate risk, patient instructed not to ambulate independently in the Center, and cord for call light placed within reach of patient.    Patient is on home O2  Patient has not has a time when it was difficult to intubate them    Completed by:  Jodi Marble, LPN

## 2020-09-18 NOTE — Progress Notes (Signed)
Kipp Laurence Dorothe Elmore DO   ADVANCED LAPAROSCOPY, BARIATRIC AND ROBOTIC SURGERY  [facility]       INITIAL EVALUATION - HISTORY AND PHYSICAL  09/18/20    PATIENT: Sierra [name]      DATE OF BIRTH: [birth year]     ----------------------------------------------------------------------------------------------------------------------    HISTORY OF PRESENT ILLNESS     Chief Complaint: Morbid Obesity and associated comorbid conditions.    Sierra [name] is a 40 y.o. female with morbid obesity and associated comorbid conditions who [facility] for [facility]ariatric surgery.    The patient stands Height: 5\' 6"  (167.6 cm) (BCC HGT CHK) tall with a weight of Weight: (!) 349 lb 12.8 oz (158.7 kg) , and has a BMI of Body mass index is 56.46 kg/m??..  The patient has failed multiple attempts at non-surgical weight loss, and is now seeking surgical intervention to promote permanent and consistent weight loss.  The patient suffers from multiple co-morbidities as a result of morbid obesity as outlined in the past medical history.    The patient denies  a history of myocardial infarction, deep vein thrombosis, pulmonary embolism, renal failure, hepatic failure, stroke, and seizure.     She does not smoke, and does not drink alcohol.    Had Covid last fall, remain on O2 because of this. Currently on 2 L.    Currently non-compliant on medications for 2 months.     Review of Systems   Constitutional:  Negative for activity change, appetite change, chills, diaphoresis, fatigue and fever.   HENT:  Negative for congestion.    Eyes:  Negative for discharge.   Respiratory:  Positive for cough and shortness of breath. Negative for chest tightness.    Cardiovascular:  Negative for chest pain and palpitations.   Gastrointestinal:  Negative for abdominal distention, abdominal pain, diarrhea, nausea and vomiting.   Endocrine: Negative for cold intolerance and heat intolerance.    Genitourinary:  Negative for difficulty urinating.   Musculoskeletal:  Negative for arthralgias, gait problem and myalgias.   Skin:  Negative for color change and pallor.   Allergic/Immunologic: Negative for environmental allergies and food allergies.   Neurological:  Negative for dizziness and facial asymmetry.   Hematological:  Negative for adenopathy. Does not bruise/bleed easily.   Psychiatric/Behavioral:  Negative for agitation and behavioral problems.           PAST HISTORIES     Past Medical History:   Diagnosis Date    Abdominal pain     Arthritis     In back    Back pain     Diabetes mellitus (HCC)     Heartburn     History of stomach ulcers     Hypertension     Joint pain, hip     Joint pain, knee     Open wound     Snoring     SOB (shortness of breath)       Past Surgical History:   Procedure Laterality Date    CESAREAN SECTION  2018    CHOLECYSTECTOMY  2020     Family History   Problem Relation Age of Onset    Hypertension Mother     Diabetes Mother     High Blood Pressure Mother     Heart Disease Father     High Blood Pressure Father     Diabetes Father      Social History     Tobacco  Use    Smoking status: Former     Packs/day: 0.25     Years: 0.50     Pack years: 0.13     Types: Cigarettes     Quit date: 2018     Years since quitting: 4.6    Smokeless tobacco: Never   Substance Use Topics    Alcohol use: Not Currently        Current Outpatient Medications   Medication Sig Dispense Refill    tiZANidine (ZANAFLEX) 4 MG tablet Take 4 mg by mouth daily      topiramate (TOPAMAX) 100 MG tablet Take 1 tablet by mouth in the morning and 1 tablet in the evening. 60 tablet 5    Ubrogepant (UBRELVY) 50 MG TABS Take 50 mg by mouth as needed (headache) 20 tablet 5    amLODIPine (NORVASC) 10 MG tablet Take 10 mg by mouth Indications: High Blood Pressure Disorder      Galcanezumab-gnlm (EMGALITY) 120 MG/ML SOAJ Inject 240 mg (2 pens) under the skin once for first dose, then inject 120 mg (1 pen) under the skin  once every 30 days thereafter. (Patient not taking: Reported on 09/18/2020) 2 mL 0    Galcanezumab-gnlm (EMGALITY) 120 MG/ML SOAJ Inject 120 mg into the skin every 30 days (Patient not taking: Reported on 09/18/2020) 1 mL 3    nystatin (MYCOSTATIN) 100000 UNIT/GM powder Apply 3 times daily. (Patient not taking: Reported on 09/18/2020) 60 g 0    gabapentin (NEURONTIN) 300 MG capsule Take 300 mg by mouth 3 times daily. (Patient not taking: Reported on 09/18/2020)      aspirin 81 MG chewable tablet Take 1 tablet by mouth daily (Patient not taking: Reported on 09/18/2020) 30 tablet 3    insulin 70-30 (HUMULIN;NOVOLIN) (70-30) 100 UNIT per ML injection vial Inject 90 Units into the skin 2 times daily (Patient not taking: Reported on 09/18/2020) 1 vial 3    insulin lispro (HUMALOG) 100 UNIT/ML injection vial Inject 25 Units into the skin 3 times daily (with meals) (Patient not taking: Reported on 09/18/2020) 1 vial 3    atorvastatin (LIPITOR) 20 MG tablet Take 1 tablet by mouth nightly (Patient not taking: Reported on 09/18/2020) 30 tablet 3    oxaprozin (DAYPRO) 600 MG tablet Take 600 mg by mouth  (Patient not taking: Reported on 09/18/2020)      omeprazole (PRILOSEC) 40 MG delayed release capsule TAKE 1 CAPSULE BY MOUTH ONCE DAILY 30 MINUTES BEFORE MORNING MEAL (Patient not taking: Reported on 09/18/2020)      lisinopril (PRINIVIL;ZESTRIL) 20 MG tablet Take 25 mg by mouth  (Patient not taking: Reported on 09/18/2020)      carvedilol (COREG) 25 MG tablet Take 25 mg by mouth 2 times daily  (Patient not taking: Reported on 09/18/2020)       No current facility-administered medications for this visit.      Allergies   Allergen Reactions    Latex Hives    Fish-Derived Products     Pineapple Other (See Comments)     Pineapple and flavoring  Pineapple and flavoring      Raspberry Other (See Comments)     Raspberries and flavorings  Raspberries and flavorings      Sumatriptan        PHYSICAL EXAM     BP (!) 175/133    Pulse 100    Temp  97.2 ??F (36.2 ??C)    Resp 12    Ht  (1.676  m) Comment: BCC HGT CHK   Wt (!) 349 lb 12.8 oz (158.7 kg)    BMI 56.46 kg/m??     General:   This patient is awake, alert, and oriented, with normal affect and is in no apparent distress.    Cardiac:   Regular rate and rhythm without evidence of murmur.  Respiratory:   Clear to auscultation bilaterally with normal effort.  Abdomen:   Obese, soft, non-tender, non-distended without masses/ No evidence of abdominal hernia / Incisions consistent with previous surgeries.  Head and Neck:  Obese, normocephalic and atraumatic/soft and supple, no  lymphadenopathy or obvious bruits. No thyroidmegaly.  Extremities:   No cyanosis, clubbing or edema/ No calf tenderness/No  restrictions of movement, is ambulatory without assistance.  Neurological:   Intact x 4 extremities, normal sensation, no focal deficits notes.  Skin:    Skin cool, warm and dry. No rashes or lesions noted.  Rectal:   Deferred    LABORATORY STUDIES AND IMAGING     Laboratory Studies:    No results for input(s): NA, K, CL, CO2, BUN, CREATININE, GLUCOSE, CALCIUM in the last 72 hours.    No results for input(s): WBC, RBC, HGB, HCT, MCV, MCH, MCHC, RDW, PLT, MPV in the last 72 hours.    No results for input(s): ALKPHOS, ALT, AST, PROT, BILITOT, BILIDIR, LABALBU, LIPASE in the last 72 hours.    Imaging Studies:    N/a    ASSESSMENT     Based on today's evaluation, the patient is not a candidate for Laparoscopic Sleeve Gastrectomy and Laparoscopic Liver Wedge Biopsy .    ** Patient needs to show compliance on her blood pressure/diabetes medication and CPAP prior to proceeding with surgery. We will continue the workup, however, we will not proceed with surgical intervention unless 6 months of compliance is displayed **    She chose Laparoscopic Liver Wedge Biopsy . In anticipation of weight reductive surgery now or in the future, we spent a great deal of time discussing the risks and benefits of , including but not  limited to injury to intra-abdominal organs, breakdown of the gastric staple line, the need for re-operative therapy,  prolonged hospitalization,  mechanical ventilation,  and death. We discussed the possibility of bleeding, the need for blood transfusions, blood clots, hospital-acquired and intra-abdominal infection, anastomotic stricture, and worsening GERD.  And we discussed the need for post-operative visit compliance, behavior modifications and diet changes, protein and vitamin supplementation, as well as routine scheduled and dedicated exercise.  We discussed the potential weight loss benefit of approximately 60-80% of her excess body weight at 12-18 months post-op, as well as the possibility of insufficient weight loss or weight gain after 2 years post-operative time. Upon completion of all required pre-operative testing we will submit for insurance pre-authorization.     PLAN     Encounter Diagnoses   Name Primary?    Morbid obesity with body mass index (BMI) of 60.0 to 69.9 in adult [facility](HCC) Yes    Secondary hypertension     Back pain, unspecified back location, unspecified back pain laterality, unspecified chronicity     Obstructive sleep apnea syndrome     Type 2 diabetes mellitus without complication, with long-term current use of insulin (HCC)     Heartburn     Anxiety     Post-COVID-19 syndrome        I have recommended proceeding with the evaluation and work-up for the primary procedure as outlined below:  PATIENT SUMMARY Sierra [name]   Dr. Danne Baxter   40 y.o. female with Body mass index is 56.46 kg/m??.    Laparoscopic Sleeve Gastrectomy and Laparoscopic Liver Wedge Biopsy   Procedure  DM[x]   HTN[x]   OSA[x]   GERD[x]   HL[x]   OA[]  TOB[]  Date of Surgery: TBD   NOTES   PCP: SEAN WILSON, DO     INITIAL TESTING RESULTS   Labwork [x]  CMP, TSH, Fasting Lipid Profile, Mg, Zinc, Vit B1 (whole blood), Vit B12, 25-OH Vit D, Fe, Ferritin, Folate    Tobacco [x]  Serum Nicotine / Cotinine  []  Negative  []   Positive   EGD [x]  Dx: [x]  GERD   []  Dyspepsia  []  Other    Pathology [x]  H. pylori  []  Negative  []  Positive   UGI [x]   []  not ordered    Abdomen []    []  not ordered    OSA eval [x]  []  On CPAP / Obtain settings    Hematology []  []  Hypercoagulation panel    Toxicology [x]  [x]  Urine drug screen   [x]  EtOH screen    Addtional [x]   [x]  Hgb A1c      INITIAL CONSULTATIONS CLEARANCE / MANAGEMENT   Psychology [x]      Dietitian [x]       Cardiology [x]  []  not ordered    Pulmonary [x]   []  not ordered    Others []  [] Heme/Onc  [] Psychiatry   [] Pain mgmt    PSD [x]   Physician supervised diet: [] None   [x] 3 mos   [] 6 mos  Patient needs to show compliance on her blood pressure/diabetes medication and CPAP prior to proceeding with surgery. We will continue the workup, however, we will not proceed with surgical intervention unless 6 months of compliance is displayed   Preop diet [x]   Preop low calory diet: [] None [] 1 wk  [] 2 wks   [] Ext.      FINAL PRE-OP TESTING RESULTS   Labwork [x]  [x] Pre-op CBC  [x] BMP  [] Serum Nicotine / Cotinine    EKG [x]      CXR [x]        POST-OP MEDICATIONS    Ulcer Ppx []   Omeprazole 20 mg PO [] QD  [] BID    Gallstone Ppx []  Ursodiol 300 mg  [] BID    DVT Ppx []   DVT prophylaxis per final preop visit estimated risk Estimated calculated risk:  %     Schedule final pre-operative office visit with surgeon, pre-operative education class, and pre-operative exercise class prior to date of surgery     ATTESTATION      I reviewed with the patient the details of the proposed operation.  The risks, benefits and options were discussed. Risks included but were not limited to bleeding, infection, damage to other surrounding organs, cardio-pulmonary complications related to anesthesia, conversion from laparoscopic to and open procedure, the need for reoperative or endoscopic therapy, the potential for prolonged mechanical ventilation, and death. All questions were fully answered to the patient's satisfaction and they  wish to proceed with surgical intervention.    I personally performed the evaluation and management of Sierra [name] in the development of a treatment plan for this patient. I personally interviewed the patient and performed an individual physical examination. In addition, I discussed the patient's condition and treatment options with them. I have also reviewed and agree with the past medical, family and social history unless otherwise noted. All of the patient's questions were answered.    Electronically signed by , DO  on 09/18/2020 at 2:37 PM     Patient Care Team:  Anson [place], DO as PCP - General  Carolin Guernsey, DO as Consulting Physician (General Surgery)

## 2020-09-18 NOTE — Other (Unsigned)
Patient Acct Nbr: [id number]   Primary AUTH/CERT:   Primary Insurance Company Name: [employer] Plan name: [facility]  Primary Insurance Group Number: [id number]  Primary Insurance Plan Type: Health  Primary Insurance Policy Number: [id number]

## 2020-09-19 ENCOUNTER — Telehealth

## 2020-09-19 NOTE — Telephone Encounter (Signed)
-----   Message from Carolin Guernsey, DO sent at 09/18/2020  4:00 PM EDT -----  Patient is requesting a new PCP. I sent a referral to [facility]. Patient needs to show compliance on her blood pressure/diabetes medication and CPAP prior to proceeding with surgery. She is currently non-compliant. We will continue the workup, however, we will not proceed with surgical intervention until 6 months of compliance is displayed by the patient.

## 2020-10-03 NOTE — Telephone Encounter (Signed)
Initial [facility] surgical patient Navigation & Financial Counseling Discussion     Patient Communication: In office     SURGEON:        []  JZ          []   AD     []   MP         []   TB          [x]   LM    PROCEDURE:   []  LRYGB  [x]   LSG   []  SADI-S   []  SADI                              []  UNDECIDED         []  REV:  SPECIFY:____________________    Confirmed pt wants to continue with surgical program/plan          []  YES   []  NO (complete program withdrawal note/process)     CO-MORBIDS:    []  NONE  [x]  DM  [x] HTN  [x]  OSA  [x] GERD  []  OTH:     PRIVATE PAY:    [x]  NO       [] YES     DATE OF INITIAL BENEFITS VERIFICATION:      TRANSFER FU:  []  YES   [x]  NO    PRIMARY INSURANCE: Payor: COMMUNITY PL / Plan: COMMUNITY PLAN / Product Type: *No Product type* /   BENEFIT ON PLAN: []  NO  [x]  YES  BENEFIT MAX:  []  NO  []  YES  -- BENEFIT MAX: $  EMPLOYER:   DIET AND EXERCISE (DE) REQUIREMENT PRIMARY      []  NONE  [x] 59M   []  61M  [] 44M    []  Medicare 4 Months  []  SPR (59M)    [] OTHER: _____    SECONDARY INSURANCE: no  BENEFIT ON PLAN: []  NO  []  YES  BENEFIT MAX:         []  NO  []  YES  -- BENEFIT MAX: $_____  AUTH REQUIRED FROM SECONDARY    []   NO   []  YES  DIET AND EXERCISE REQUIREMENT SECONDARY      []  NONE     [] 59M  []  61M       []  Medicare 4 months   []  SPR (59M)    [] OTHER: ___    [x]  Discussed with patient:               Financial cost overview (document signed and pt given copy at new pt consult visit with surgeon),               Initial appointments: Bariatric Nutrition Assessment  (BNA)  & Diet and Exercise (DE)               Patient to look for yellow envelope in mail.  This yellow envelope will contain orders for labs, testing and required                clearances.                Pt encouraged to complete early in program to prevent delays.  Encourage blood work to be draw by 1st DE               appointment.   [x]   Reviewed OOP cost, including:   [x]  Overview of inpatient admission  benefits - estimated inpatient co-pays, deductibles and/or co-insurance -  Estimated OOP costs form reviewed with patient, and copy given to patient at new pt visit.   [x]  Reviewed next steps with patient:     1) Scheduled at new pt surgeon visit:                  Registered Dietician (RD) for a Nutrition Assessment (BNA) and  Pre-operative Diet and Exercise (DE) appointment #1.                     [x]  Patient reminded to arrive 15 minutes early for check in.  Late arrivals may need to be rescheduled.                2) Schedule:  Diet and Exercise Apt #2 only scheduled after initial BNA and DE completed,                3) Behavioral Health apt scheduled after DE started.   Reviewed rational and goal of Behavioral Health appointments.                4) [x]  Reinforced need to cancel any WMI appointments 48 hours in advance.                      Cautioned NS/Same day cancellations may result in delay in program or program  completion hold.  Noted:  DE                     series needs to be a monthly series or insurance company may require repeat of the entire series.                 5)  []  Smoker/tobacco products including vaping:  reviewed need for cessation before surgery clearance and life long                    abstinence after surgery for best outcomes.     Patient navigation to surgery:   [x]   Explained to patient that average time from initial consult to date of surgery can be 6-8 months.           - Process can take longer if there are cancelled appointments, delays in testing and/or additional clearances that needs to             be completed.            - Reviewed importance of patient active engagement in making and keeping appointments to keep the process moving.            - Reinforced need to cancel appointments at least 48 hours in advance.            **Reviewed that instances of No Shows and Same Day Appointment Cancellations may result in program/surgery delay               or hold.   [x]   Patient  advised of importance of having voicemail and MyChart for office communications and lab/testing results before and after surgery.

## 2020-10-05 ENCOUNTER — Ambulatory Visit

## 2020-10-05 NOTE — Progress Notes (Signed)
EGD pended - LM  DE - 3

## 2020-10-05 NOTE — Telephone Encounter (Signed)
I have recommended proceeding with the evaluation and work-up for the primary procedure as outlined below:         PATIENT SUMMARY Sierra [name]   Dr. Danne Baxter    40 y.o. female with Body mass index is 56.46 kg/m??.    Laparoscopic Sleeve Gastrectomy and Laparoscopic Liver Wedge Biopsy   Procedure   DM[x]   HTN[x]   OSA[x]   GERD[x]   HL[x]   OA[]  TOB[]  Date of Surgery: TBD   NOTES     PCP: SEAN WILSON, DO       INITIAL TESTING RESULTS   Labwork [x]   CMP, TSH, Fasting Lipid Profile, Mg, Zinc, Vit B1 (whole blood), Vit B12, 25-OH Vit D, Fe, Ferritin, Folate     Tobacco [x]   Serum Nicotine / Cotinine  []  Negative  []  Positive   EGD [x]   Dx: [x]  GERD   []  Dyspepsia  []  Other     Pathology [x]   H. pylori  []  Negative  []  Positive   UGI [x]    []  not ordered     Abdomen []    []  not ordered     OSA eval [x]   []  On CPAP / Obtain settings     Hematology []   []  Hypercoagulation panel     Toxicology [x]   [x]  Urine drug screen   [x]  EtOH screen     Addtional [x]   [x]  Hgb A1c         INITIAL CONSULTATIONS CLEARANCE / MANAGEMENT   Psychology [x]         Dietitian [x]         Cardiology [x]   []  not ordered     Pulmonary [x]   []  not ordered     Others []   [] Heme/Onc  [] Psychiatry   [] Pain mgmt     PSD [x]    Physician supervised diet: [] None   [x] 3 mos   [] 6 mos  Patient needs to show compliance on her blood pressure/diabetes medication and CPAP prior to proceeding with surgery. We will continue the workup, however, we will not proceed with surgical intervention unless 6 months of compliance is displayed   Preop diet [x]    Preop low calory diet: [] None [] 1 wk  [] 2 wks   [] Ext.         FINAL PRE-OP TESTING RESULTS   Labwork [x]   [x] Pre-op CBC  [x] BMP  [] Serum Nicotine / Cotinine     EKG [x]         CXR [x]             POST-OP MEDICATIONS     Ulcer Ppx []   Omeprazole 20 mg PO [] QD  [] BID     Gallstone Ppx []   Ursodiol 300 mg  [] BID     DVT Ppx []   DVT prophylaxis per final preop visit estimated risk Estimated calculated risk:  %       Schedule final pre-operative office visit with surgeon, pre-operative education class, and pre-operative exercise class prior to date of surgery      ATTESTATION       I reviewed with the patient the details of the proposed operation.  The risks, benefits and options were discussed. Risks included but were not limited to bleeding, infection, damage to other surrounding organs, cardio-pulmonary complications related to anesthesia, conversion from laparoscopic to and open procedure, the need for reoperative or endoscopic therapy, the potential for prolonged mechanical ventilation, and death. All questions were fully answered to the patient's satisfaction and they wish to proceed with surgical intervention.  I personally performed the evaluation and management of Sierra [name] in the development of a treatment plan for this patient. I personally interviewed the patient and performed an individual physical examination. In addition, I discussed the patient's condition and treatment options with them. I have also reviewed and agree with the past medical, family and social history unless otherwise noted. All of the patient's questions were answered.     Electronically signed by Carolin Guernsey, DO on 09/18/2020 at 2:37 PM

## 2020-10-05 NOTE — Telephone Encounter (Signed)
Orders pended.

## 2020-10-05 NOTE — Progress Notes (Signed)
Signed, thanks!

## 2020-10-05 NOTE — Addendum Note (Signed)
Addended by: Maricela Bo on: 10/05/2020 09:32 AM     Modules accepted: Orders

## 2020-10-05 NOTE — Addendum Note (Signed)
Addended by: Lalla Brothers on: 10/05/2020 12:11 PM     Modules accepted: Orders

## 2020-10-05 NOTE — Telephone Encounter (Signed)
Signed, thanks!

## 2020-10-09 NOTE — Progress Notes (Signed)
Printed

## 2020-10-09 NOTE — Progress Notes (Signed)
Patient is scheduled for 11/07/20

## 2020-10-17 NOTE — Telephone Encounter (Signed)
[facility] EGD CHART REVIEW:    BNA Complete   [x]  YES []  NO, scheduled for 10/30/20  D/E in Progress    [x]  YES []  NO 11/02/2020   Psych Eval Scheduled/Complete []  YES [x]  NO, scheduling notified to contact pt to schedule.     [x]  Mini Chart Review completed, patient okay to move forward with EGD. (BNA completed and 1st DE completed)   []  Mini Chart Review completed, patient has no showed for appointments &/or is NOT okay to move forward with EGD at this time.        Surgeon APP notified regarding potential need to reschedule EGD and directive is to     [x]  Patient called for Endoscopy/C-Scope appointment reminder and COVID screening/testing update.        Reminded patient of Endoscopy/C-Scope scheduled for 10/19//2022.         COVID Vaccination Screening:      Patient has received full COVID vaccination. (1st and 2nd dose)   []   YES  (record in medical history)         [x]   *NO          Pre-EGD COVID testing:   *Symptoms &/or recent exposure to someone who has been diagnosed COVID positive in the last two weeks.   [x]     No Covid testing indicated:  Patient denies symptoms &/or recent exposure.   []     *Covid testing indicated:  Patient notified to complete COVID testing   -     Order Pended and sent to NP to sign  (Z11.59 Screening for Viral Disease)  Patient given central scheduling phone number [phone])  Central Scheduling to provide patient with appointment and testing location information  Patient notified that COVID testing needs completed 3 days prior to procedure  Patient notified to have Testing done at [facility] only. DO NOT have testing done anywhere else.    []  Unable to reach patient, left detailed voicemail left reminding of Endoscopy/C-Scope appointment.        Asked that patient return my call ASAP to review COVID Screening questions.  []  Patient returned call and COVID Screening Complete.   []  other

## 2020-10-29 NOTE — Telephone Encounter (Signed)
Orders mailed.   Pulmonary referral order faxed to [phone] along with clearance form, snapshot and consult note.

## 2020-10-30 ENCOUNTER — Encounter: Primary: Student in an Organized Health Care Education/Training Program

## 2020-10-30 NOTE — Progress Notes (Deleted)
[facility]  BARIATRIC NUTRITION ASSESSMENT / DIET & EXERCISE   SURGICAL WEIGHT LOSS MANAGEMENT PROGRAM    Date: 10/30/20    Patient Name: Sierra [name]   Date of Birth: 03/27/1980     Type of Assessment:   [x]  Surgical Patient - Pre-op Initial Assessment    Weight Metrics:  Today's Height:  5'6"  Today's Weight:   Last reported weight was 349 lb on 09/18/20  Today's BMI:      Surgeon: Mellert  Surgical Procedure:    [x]   Sleeve Gastrectomy  Preop Diet: Not specified in care coordination note     Medical History:  Past Medical History:   Diagnosis Date    Abdominal pain     Arthritis     In back    Back pain     Diabetes mellitus (HCC)     Heartburn     History of stomach ulcers     Hypertension     Joint pain, hip     Joint pain, knee     Open wound     Snoring     SOB (shortness of breath)        Current Medications:   has a current medication list which includes the following prescription(s): tizanidine, emgality, emgality, topiramate, ubrelvy, nystatin, gabapentin, aspirin, insulin 70-30, insulin lispro, atorvastatin, oxaprozin, omeprazole, lisinopril, carvedilol, and amlodipine.    Weight History   Patient has been considering weight loss surgery for {0 - 10:19007} years   Primary reason(s) for weight loss ***  Number of years over weight {0 - 10:19007}    PREVIOUS WEIGHT LOSS ATTEMPTS   Method When  Amount lost  Amount regained    Most successful                                Post Weight Loss Surgery-  Type:     Patient's current diet quality, relative to the Past weight loss surgery diet is:           []  Good  []  Fair  []  Poor               CURRENT MEAL PLANNING   Self Spouse Significant Other Child Not Planned or Varies Other   Meals planned by        Food shopping done by        Meals cooked by          CURRENT EATING BEHAVIORS:     {BCCRDEatingConcerns:45660}    EXERCISE/CURRENT ACTIVITY  Patient's current exercise routine:    Recommendation : ***    CURRENT EATING  HABITS/ADDITIONAL INFORMATION  24 Hour Recall completed:     {YES/NO:19726}  Breakfast: ***  Snack: ***  Lunch: ***  Dinner: ***  Snack: ***  Drinks: ***  Comments: ***    SUPPORT SYSTEM  A. Family knowledgeable about/supportive of plans for weight loss surgery:   {YES/NO:19726}  B. Patient understands that they must have someone in their home 24/7 for the first week following surgery, or that they must be able to stay with someone for the first week  {YES/NO:19726}  C. Co-workers knowledgeable about/supportive of plans for weight loss surgery:   {YES/NO/NA:19705}    KNOWLEDGE AND EDUCATION ASSESSMENT AND PLAN  Patient's level of knowledge regarding the changes that will have to be made in their diet following weight loss surgery is:    []   Excellent - patient is well informed.     []  Good - patient has good foundation of knowledge, will require some education    []  Fair - Patient has beginning understanding of changes, will require significant education    []  Poor - ***     Areas of concern include:   {BCCEatingConcerns:45663}      Current eating practices that will require change with surgery:   {BCCeatingtochange:45664}    RECOMMENDATIONS AND PLAN    []  Educational Materials Provided  [] Strategies for Eating handout given to and discussed with patient  []  ***    []  Patient cleared for weight loss surgery    Patient able to state major diet changes that need to be made following surgery  Patient states/demonstrates readiness to make necessary diet changes    []  Anticipate clearing patient for weight loss surgery, but need to see patient again:   []  after patient reviews educational materials    []  after patient completes initial psychological evaluation   []  Other: ***              []  Follow-up: ***      []  Unable to clear patient for weight loss surgery because: ***     []  Will discuss with surgeon     []  Will discuss with psychologist     []  Will schedule for discussion at Team Meeting    Diagnoses:    Obese/overweight   Bariatric Nutrition Assessment completed by: MS, RDN, LD

## 2020-11-02 NOTE — Progress Notes (Signed)
Authorization for Testing Initiated.    Company Name & Number Contacted for Auth: [facility] Online Portal  Name of Representative: n/a  Name of Procedure: EGD  Cpt Code: 48751  Diagnosis Code: R10.13  Location of Procedure:   Is Auth Required:  yes,     Pending Case #:     Clinical Reviewer:   Additional Info if given: OV note           Approved Case reference: [id number]  Date Range for Approved Auth:  11/07/2020-01/07/2021

## 2020-11-06 NOTE — Progress Notes (Signed)
Pre call completed . Pt did not have any concerns

## 2020-11-07 ENCOUNTER — Inpatient Hospital Stay: Admit: 2020-11-07 | Attending: Surgery | Primary: Student in an Organized Health Care Education/Training Program

## 2020-11-07 MED ORDER — SODIUM CHLORIDE 0.9 % IV SOLN
0.9 % | INTRAVENOUS | Status: DC
Start: 2020-11-07 — End: 2020-11-08
  Administered 2020-11-07: 11:00:00 1000 via INTRAVENOUS

## 2020-11-07 NOTE — Progress Notes (Signed)
Patient cancelled due to lack of responsible person to accompany patient home. Patient given instructions to reschedule and make sure she has a responsible person with her for the next procedure.

## 2020-11-07 NOTE — Discharge Instructions (Signed)
Please call office to reschedule EGD. Please make sure you have a responsible adult to take you home.

## 2020-11-07 NOTE — H&P (Signed)
Department of Surgery   Preoperative H&P    PATIENT NAME: Sierra [name]    DOB:  1980/12/10    MRN:  [id number]  ATTENDING PHYSICIAN:  Carolin Guernsey, DO  ADMIT DATE:  11/07/2020    TODAY'S DATE:  11/07/2020    HISTORY OF PRESENT ILLNESS:  The patient is a 40 y.o. female who presents for GERD/Dyspepsia, Morbid Obesity  Thoroughly reviewed the patient's medical history, family history, social history and review of systems with the patient today.  Please see medical record for pertinent positives.     Past Medical History:        Diagnosis Date    Abdominal pain     Arthritis     In back    Back pain     Bilateral pneumonia 12/2019    Ended up on 2L02 Continuous    Diabetes mellitus (HCC)     Heartburn     History of stomach ulcers     Hypertension     Joint pain, hip     Bilateral    Joint pain, knee     Bilateral    Open wound     Pelvic area where she had C section; she claims she has had multiple hospital admissions d/t the wound    Oxygen dependent 12/2019    2L02 Continuous    Snoring     SOB (shortness of breath)        Past Surgical History:        Procedure Laterality Date    CESAREAN SECTION  2018    CHOLECYSTECTOMY  2020       Current Medications:   Current Facility-Administered Medications: 0.9 % sodium chloride infusion, , IntraVENous, Continuous  Prior to Admission medications    Medication Sig Start Date End Date Taking? Authorizing Provider   tiZANidine (ZANAFLEX) 4 MG tablet Take 4 mg by mouth daily 09/08/20   Historical Provider, MD   Galcanezumab-gnlm (EMGALITY) 120 MG/ML SOAJ Inject 120 mg into the skin every 30 days  Patient not taking: Reported on 09/18/2020 08/29/20   Sable Feil Gayer, PA-C   topiramate (TOPAMAX) 100 MG tablet Take 1 tablet by mouth in the morning and 1 tablet in the evening. 08/28/20   Sable Feil Gayer, PA-C   Ubrogepant (UBRELVY) 50 MG TABS Take 50 mg by mouth as needed (headache) 08/28/20   Romeo Apple, PA-C   gabapentin (NEURONTIN) 300 MG capsule Take 300 mg by mouth 3 times  daily.  Patient not taking: No sig reported    Historical Provider, MD   insulin lispro (HUMALOG) 100 UNIT/ML injection vial Inject 25 Units into the skin 3 times daily (with meals)  Patient not taking: No sig reported 09/26/18   Deepthi Singam, MD   atorvastatin (LIPITOR) 20 MG tablet Take 1 tablet by mouth nightly  Patient not taking: No sig reported 09/26/18   Deepthi Singam, MD   omeprazole (PRILOSEC) 40 MG delayed release capsule TAKE 1 CAPSULE BY MOUTH ONCE DAILY 30 MINUTES BEFORE MORNING MEAL  Patient not taking: No sig reported 07/23/17   Historical Provider, MD   lisinopril (PRINIVIL;ZESTRIL) 20 MG tablet Take 25 mg by mouth   Patient not taking: No sig reported 12/24/17   Historical Provider, MD   carvedilol (COREG) 25 MG tablet Take 25 mg by mouth 2 times daily   Patient not taking: No sig reported 10/27/17   Historical Provider, MD   amLODIPine (NORVASC) 10 MG tablet Take 10  mg by mouth Indications: High Blood Pressure Disorder 10/27/17   Historical Provider, MD        Allergies:  Latex, Fish-derived products, Pineapple, Raspberry, and Sumatriptan    Social History:   Social History     Socioeconomic History    Marital status: Single     Spouse name: Not on file    Number of children: Not on file    Years of education: Not on file    Highest education level: Not on file   Occupational History    Not on file   Tobacco Use    Smoking status: Former     Packs/day: 0.25     Years: 0.50     Pack years: 0.13     Types: Cigarettes     Quit date: 2018     Years since quitting: 4.8    Smokeless tobacco: Never   Vaping Use    Vaping Use: Never used   Substance and Sexual Activity    Alcohol use: Not Currently    Drug use: Not Currently    Sexual activity: Not Currently   Other Topics Concern    Not on file   Social History Narrative    Not on file     Social Determinants of Health     Financial Resource Strain: Not on file   Food Insecurity: Not on file   Transportation Needs: Not on file   Physical Activity: Not on file    Stress: Not on file   Social Connections: Not on file   Intimate Partner Violence: Not on file   Housing Stability: Not on file       Family History:        Problem Relation Age of Onset    Hypertension Mother     Diabetes Mother     High Blood Pressure Mother     Heart Disease Father     High Blood Pressure Father     Diabetes Father        REVIEW OF SYSTEMS:  CONSTITUTIONAL:  Negative for fatigue, and unexpected weight change  RESPIRATORY:  Negative for cough, SOB, and wheezing  CARDIOVASCULAR:  Negative for chest pains and palpatations  GASTROINTESTINAL:   GERD/Dyspepsia  HEMATOLOGIC/LYMPHATIC:  Negative for adenopathy. Does not bruise/bleed easily.  NEUROLOGICAL:  Negative for seizures and syncope  * All other ROS reviewed see HPI for pertinent positives and negatives.      PHYSICAL EXAM:  VITALS:  BP (!) 197/120    Pulse 89    Temp 96.9 ??F (36.1 ??C) (Temporal)    Resp 20    LMP 09/29/2016 Comment: Signed Refusal for pregnancy test   SpO2 95%     GENERAL:  Oriented to person, place, and time. Appears well nourished. No distress   ENT:  Normocepalic,atraumatic, without obvious abnormality  NECK:  supple, symmetrical, trachea midline  LUNGS: Resp effort easy and unlabored, breath sounds normal  CARDIOVASCULAR:  RRR, No murmur  ABDOMEN:  Soft, non-tender, no open wounds.  MUSCULOSKELETAL: Normal range of motion, ambulatory without assistance  NEUROLOGIC:  No focal neurologic deficits    IMPRESSION & PLAN:    40 y.o. female presents today with GERD/Dyspepsia, Morbid Obesity  -Plan for EGD with Biopsy today  -Patient counseled on risks, benefits, and alternatives of treatment plan at length. Patient states an understanding and willingness to proceed with plan.    Electronically signed by Carolin Guernsey, DO on 11/07/20 at 7:36 AM EDT  Pager:[phone]

## 2020-11-07 NOTE — Other (Unsigned)
Patient Acct Nbr: [id number]   Primary AUTH/CERT: [id number]  Primary Insurance Company Name: [employer] Plan name: [facility] Mdcaid  Primary Insurance Group Number: [facility]  Primary Insurance Plan Type: Health  Primary Insurance Policy Number: [id number]

## 2020-11-16 ENCOUNTER — Ambulatory Visit
Admit: 2020-11-16 | Payer: PRIVATE HEALTH INSURANCE | Attending: Student in an Organized Health Care Education/Training Program | Primary: Student in an Organized Health Care Education/Training Program

## 2020-11-16 DIAGNOSIS — E119 Type 2 diabetes mellitus without complications: Secondary | ICD-10-CM

## 2020-11-16 NOTE — Assessment & Plan Note (Signed)
Chronic, uncontrolled.  Medication including 28 units of Humalog daily with meals.  No basal insulin regimen or metformin at the advisement of previous PCP.  Continuing to have blood glucose ranging from 75-324.  -We will obtain A1c at this visit, and schedule for DPV as soon as possible to correct insulin and diabetes regimen  -Discussed that she will need a basal insulin, with discussion of Trulicity added for benefit of weight loss, depending on her A1c  -We will verify ophthalmology and diabetic foot exam within the last year at [facility]

## 2020-11-16 NOTE — Progress Notes (Signed)
11/16/2020    Sierra [name] (DOB:  [birth year]) is a 40 y.o. female, here for a preventive medicine evaluation.    Taking 28U humalog daily. Was previously taking lantus but was told to stop taking this years ago. Previously on metformin but also stopped taking because it was "reacting with other medications."   Taking gabapentin for pinched nerves in her back and hip. Taking lisinopril 20, amlodipine , coreg 25 but hasnt taken these consistently the  last few days due to illness. NO blurry visin/ha/cp.   Had COVID last year, after which she was put on 2L O2. Has a sleep study scheduled for 11/15. Still having mild SOB, but this is about back to her baseline.     Checking BG TID. Lowest BG 75, highest 324. Gets a little shaky with high BG but no sx of low BG. Establishing with surgical bariatrics, for gastric sleeve, but has to get DM under control for 6 months before moving forward.     Patient Active Problem List   Diagnosis    Hypertensive emergency    Intractable migraine without aura and without status migrainosus    Morbid obesity with body mass index (BMI) of 60.0 to 69.9 in adult [facility])    Essential hypertension    Hypertension    Obstructive sleep apnea syndrome    Diabetes mellitus (HCC)    Snoring    SOB (shortness of breath)    Heartburn    Abdominal pain    Back pain    Post-COVID-19 syndrome    Anxiety       Review of Systems   Constitutional:  Negative for chills and fever.   HENT:  Positive for congestion. Negative for rhinorrhea.    Respiratory:  Positive for shortness of breath (baseline , on 2L O2 nc). Negative for wheezing.    Cardiovascular:  Negative for chest pain and palpitations.   Gastrointestinal:  Negative for abdominal pain, constipation, diarrhea, nausea and vomiting.   Genitourinary:  Negative for difficulty urinating.   Musculoskeletal:  Negative for arthralgias and myalgias.   Skin:  Negative for rash.   Neurological:  Negative for dizziness, weakness and headaches.    Psychiatric/Behavioral:  The patient is not nervous/anxious.      Prior to Visit Medications    Medication Sig Taking? Authorizing Provider   tiZANidine (ZANAFLEX) 4 MG tablet Take 4 mg by mouth daily  Historical Provider, MD   Galcanezumab-gnlm (EMGALITY) 120 MG/ML SOAJ Inject 120 mg into the skin every 30 days  Patient not taking: Reported on 09/18/2020  Sable Feil Gayer, PA-C   topiramate (TOPAMAX) 100 MG tablet Take 1 tablet by mouth in the morning and 1 tablet in the evening.  Sable Feil Gayer, PA-C   Ubrogepant (UBRELVY) 50 MG TABS Take 50 mg by mouth as needed (headache)  Sable Feil Gayer, PA-C   gabapentin (NEURONTIN) 300 MG capsule Take 300 mg by mouth 3 times daily.  Patient not taking: No sig reported  Historical Provider, MD   insulin lispro (HUMALOG) 100 UNIT/ML injection vial Inject 25 Units into the skin 3 times daily (with meals)  Patient not taking: No sig reported  Deepthi Singam, MD   atorvastatin (LIPITOR) 20 MG tablet Take 1 tablet by mouth nightly  Patient not taking: No sig reported  Deepthi Singam, MD   omeprazole (PRILOSEC) 40 MG delayed release capsule TAKE 1 CAPSULE BY MOUTH ONCE DAILY 30 MINUTES BEFORE MORNING MEAL  Patient not taking: No sig reported  Historical Provider, MD   lisinopril (PRINIVIL;ZESTRIL) 20 MG tablet Take 25 mg by mouth   Patient not taking: No sig reported  Historical Provider, MD   carvedilol (COREG) 25 MG tablet Take 25 mg by mouth 2 times daily   Patient not taking: No sig reported  Historical Provider, MD   amLODIPine (NORVASC) 10 MG tablet Take 10 mg by mouth Indications: High Blood Pressure Disorder  Historical Provider, MD        Allergies   Allergen Reactions    Latex Hives    Fish-Derived Products     Pineapple Other (See Comments)     Pineapple and flavoring  Pineapple and flavoring      Raspberry Other (See Comments)     Raspberries and flavorings  Raspberries and flavorings      Sumatriptan        Past Medical History:   Diagnosis Date    Abdominal pain      Arthritis     In back    Back pain     Bilateral pneumonia 12/2019    Ended up on 2L02 Continuous    Diabetes mellitus (HCC)     Heartburn     History of stomach ulcers     Hypertension     Joint pain, hip     Bilateral    Joint pain, knee     Bilateral    Open wound     Pelvic area where she had C section; she claims she has had multiple hospital admissions d/t the wound    Oxygen dependent 12/2019    2L02 Continuous    Snoring     SOB (shortness of breath)        Past Surgical History:   Procedure Laterality Date    CESAREAN SECTION  2018    CHOLECYSTECTOMY  2020       Social History     Socioeconomic History    Marital status: Single     Spouse name: Not on file    Number of children: Not on file    Years of education: Not on file    Highest education level: Not on file   Occupational History    Not on file   Tobacco Use    Smoking status: Former     Packs/day: 0.25     Years: 0.50     Pack years: 0.13     Types: Cigarettes     Quit date: 2018     Years since quitting: 4.8    Smokeless tobacco: Never   Vaping Use    Vaping Use: Never used   Substance and Sexual Activity    Alcohol use: Not Currently    Drug use: Not Currently    Sexual activity: Not Currently   Other Topics Concern    Not on file   Social History Narrative    Not on file     Social Determinants of Health     Financial Resource Strain: Not on file   Food Insecurity: Not on file   Transportation Needs: Not on file   Physical Activity: Not on file   Stress: Not on file   Social Connections: Not on file   Intimate Partner Violence: Not on file   Housing Stability: Not on file        Family History   Problem Relation Age of Onset    Hypertension Mother     Diabetes Mother     High Blood  Pressure Mother     Heart Disease Father     High Blood Pressure Father     Diabetes Father        ADVANCE DIRECTIVE: N, <no information>    Vitals:    11/16/20 0908   BP: (!) 147/105   Pulse: 85   Temp: 96.8 ??F (36 ??C)   Weight: (!) 353 lb (160.1 kg)   Height: 5\' 6"   (1.676 m)     Estimated body mass index is 56.98 kg/m?? as calculated from the following:    Height as of this encounter: 5\' 6"  (1.676 m).    Weight as of this encounter: 353 lb (160.1 kg).    Physical Exam  Constitutional:       Appearance: Normal appearance. She is obese.   HENT:      Head: Normocephalic and atraumatic.      Right Ear: External ear normal.      Left Ear: External ear normal.   Eyes:      General:         Right eye: No discharge.         Left eye: No discharge.      Extraocular Movements: Extraocular movements intact.      Conjunctiva/sclera: Conjunctivae normal.   Cardiovascular:      Rate and Rhythm: Normal rate and regular rhythm.      Pulses: Normal pulses.      Heart sounds: Normal heart sounds. No murmur heard.  Pulmonary:      Effort: Pulmonary effort is normal. No respiratory distress.      Breath sounds: Normal breath sounds.   Musculoskeletal:         General: No swelling or deformity.      Right lower leg: No edema.      Left lower leg: No edema.   Skin:     General: Skin is warm and dry.      Capillary Refill: Capillary refill takes less than 2 seconds.   Neurological:      General: No focal deficit present.      Mental Status: She is alert. Mental status is at baseline.   Psychiatric:         Mood and Affect: Mood normal.         Behavior: Behavior normal.         Thought Content: Thought content normal.         Judgment: Judgment normal.       No flowsheet data found.    Lab Results   Component Value Date/Time    CHOL 159 09/25/2018 05:06 AM    TRIG 210 09/25/2018 05:06 AM    HDL 49 09/25/2018 05:06 AM    LDLCHOLESTEROL 68 09/25/2018 05:06 AM    GLUCOSE 208 04/06/2019 11:00 AM    LABA1C 6.2 09/26/2018 04:08 AM       The ASCVD Risk score (Arnett DK, et al., 2019) failed to calculate for the following reasons:    The 2019 ASCVD risk score is only valid for ages 53 to 41      There is no immunization history on file for this patient.    Health Maintenance   Topic Date Due    COVID-19  Vaccine (1) Never done    Varicella vaccine (1 of 2 - 2-dose childhood series) Never done    Pneumococcal 0-64 years Vaccine (1 - PCV) Never done    Diabetic foot exam  Never done  Depression Screen  Never done    HIV screen  Never done    Diabetic retinal exam  Never done    Hepatitis B vaccine (1 of 3 - Risk 3-dose series) Never done    DTaP/Tdap/Td vaccine (1 - Tdap) Never done    Cervical cancer screen  Never done    A1C test (Diabetic or Prediabetic)  07/20/2020    Diabetic microalbuminuria test  07/20/2020    Lipids  07/20/2020    Flu vaccine (1) Never done    Hepatitis C screen  Completed    Hepatitis A vaccine  Aged Out    Hib vaccine  Aged Out    Meningococcal (ACWY) vaccine  Aged Out       Assessment & Plan   Type 2 diabetes mellitus without complication, with long-term current use of insulin (HCC)  Assessment & Plan:  Chronic, uncontrolled.  Medication including 28 units of Humalog daily with meals.  No basal insulin regimen or metformin at the advisement of previous PCP.  Continuing to have blood glucose ranging from 75-324.  -We will obtain A1c at this visit, and schedule for DPV as soon as possible to correct insulin and diabetes regimen  -Discussed that she will need a basal insulin, with discussion of Trulicity added for benefit of weight loss, depending on her A1c  -We will verify ophthalmology and diabetic foot exam within the last year at [facility]  Orders:  -     POCT glycosylated hemoglobin (Hb A1C)  Post-COVID-19 syndrome  Assessment & Plan:  Chronic, controlled with 24-hour O2 nasal cannula, 2 L. COVID occurring last year. Has baseline shortness of breath.  Follows with pulmonology regularly.  Morbid obesity with body mass index (BMI) of 60.0 to 69.9 in adult [facility])  Assessment & Plan:  Chronic, uncontrolled.  Patient working with bariatric clinic to prepare for gastric sleeve.  Attempting to control diabetes prior to being a surgical candidate.  Essential hypertension  Assessment & Plan:  Chronic,  uncontrolled.  BP in office 147/105.  Admits to not taking antihypertensives consistently during the last 2 weeks due to viral gastroenteritis, with previous episodes of vomiting.  No symptoms of hypertension.  -Advised to continue medications daily, and repeat BP at follow-up visit.  Will increase medication at that time if needed  -Has sleep study schedule to be completed  Encounter to establish care with new doctor  Return in about 1 week (around 11/23/2020) for DPV, vv ok .         --Marbin Olshefski, DO    Prechart note:   (the following data was documented before the visit began)  Rooming: flu? Plz verify what medications theyre taking   Possible agenda: NP  Provider notes: DM - 25U TID wm? Reported not taking anything except topamax, ubrelvy (migraines), amlodipine, and zanaflex     Health Maintenance to be addressed today:    Click to review open orders Labs Encounters Imaging  [employer] [facility] ([place])  [place] CGM  Health Maintenance Due   Topic    COVID-19 Vaccine (1)    Varicella vaccine (1 of 2 - 2-dose childhood series)    Pneumococcal 0-64 years Vaccine (1 - PCV)    Diabetic foot exam     Depression Screen     HIV screen     Diabetic retinal exam     Hepatitis B vaccine (1 of 3 - Risk 3-dose series)    DTaP/Tdap/Td vaccine (1 - Tdap)  Cervical cancer screen     A1C test (Diabetic or Prediabetic)     Diabetic microalbuminuria test     Lipids     Flu vaccine (1)       (end precharting)-

## 2020-11-16 NOTE — Other (Unsigned)
Patient Acct Nbr: [id number]   Primary AUTH/CERT:   Primary Insurance Company Name: [employer] Plan name: [facility] Mdcaid  Primary Insurance Group Number: [id number]  Primary Insurance Plan Type: Health  Primary Insurance Policy Number: [id number]

## 2020-11-16 NOTE — Assessment & Plan Note (Addendum)
Chronic, uncontrolled.  BP in office 147/105.  Admits to not taking antihypertensives consistently during the last 2 weeks due to viral gastroenteritis, with previous episodes of vomiting.  No symptoms of hypertension.  -Advised to continue medications daily, and repeat BP at follow-up visit.  Will increase medication at that time if needed  -Has sleep study schedule to be completed

## 2020-11-16 NOTE — Assessment & Plan Note (Signed)
Chronic, uncontrolled.  Patient working with bariatric clinic to prepare for gastric sleeve.  Attempting to control diabetes prior to being a surgical candidate.

## 2020-11-16 NOTE — Progress Notes (Signed)
Teaching Physician Note: Indirect Supervision - Modifier GE  During or immediately after this visit, I discussed this case with the treating resident.   Our discussion included the history obtained by the resident, the resident's exam findings, and the resident's treatment plan.    Please see resident???s note for further details.    This service has been performed in part by a resident under the direction of a teaching physician (GE Modifier)    Demetrius Barrell, MD

## 2020-11-16 NOTE — Assessment & Plan Note (Signed)
Chronic, controlled with 24-hour O2 nasal cannula, 2 L. COVID occurring last year. Has baseline shortness of breath.  Follows with pulmonology regularly.

## 2020-11-19 NOTE — Progress Notes (Signed)
Resident documentation reviewed-agree with plan of care

## 2020-11-21 ENCOUNTER — Encounter
Payer: PRIVATE HEALTH INSURANCE | Attending: Family | Primary: Student in an Organized Health Care Education/Training Program

## 2020-11-28 ENCOUNTER — Encounter
Payer: PRIVATE HEALTH INSURANCE | Attending: Physician Assistant | Primary: Student in an Organized Health Care Education/Training Program

## 2023-07-14 ENCOUNTER — Encounter: Admit: 2023-07-14 | Discharge: 2023-07-14 | Primary: Adult Health
